# Patient Record
Sex: Female | Born: 1965 | Race: White | Hispanic: No | Marital: Married | Smoking: Current every day smoker
Health system: Southern US, Community
[De-identification: ages and names within clinical notes are randomized; demographics above are authoritative.]

## PROBLEM LIST (undated history)

## (undated) DIAGNOSIS — K469 Unspecified abdominal hernia without obstruction or gangrene: Secondary | ICD-10-CM

## (undated) DIAGNOSIS — F32A Depression, unspecified: Secondary | ICD-10-CM

## (undated) DIAGNOSIS — F329 Major depressive disorder, single episode, unspecified: Secondary | ICD-10-CM

## (undated) DIAGNOSIS — F419 Anxiety disorder, unspecified: Secondary | ICD-10-CM

## (undated) DIAGNOSIS — G8929 Other chronic pain: Secondary | ICD-10-CM

## (undated) DIAGNOSIS — R51 Headache: Secondary | ICD-10-CM

## (undated) DIAGNOSIS — I10 Essential (primary) hypertension: Secondary | ICD-10-CM

## (undated) DIAGNOSIS — C801 Malignant (primary) neoplasm, unspecified: Secondary | ICD-10-CM

## (undated) DIAGNOSIS — K859 Acute pancreatitis without necrosis or infection, unspecified: Secondary | ICD-10-CM

## (undated) DIAGNOSIS — S62109A Fracture of unspecified carpal bone, unspecified wrist, initial encounter for closed fracture: Secondary | ICD-10-CM

## (undated) DIAGNOSIS — J329 Chronic sinusitis, unspecified: Secondary | ICD-10-CM

## (undated) DIAGNOSIS — R519 Headache, unspecified: Secondary | ICD-10-CM

## (undated) DIAGNOSIS — G510 Bell's palsy: Secondary | ICD-10-CM

## (undated) HISTORY — PX: ABDOMINAL HYSTERECTOMY: SHX81

---

## 2004-03-12 ENCOUNTER — Emergency Department (HOSPITAL_COMMUNITY): Admission: EM | Admit: 2004-03-12 | Discharge: 2004-03-12 | Payer: Self-pay | Admitting: Emergency Medicine

## 2004-07-01 ENCOUNTER — Emergency Department (HOSPITAL_COMMUNITY): Admission: EM | Admit: 2004-07-01 | Discharge: 2004-07-02 | Payer: Self-pay | Admitting: Emergency Medicine

## 2004-07-03 ENCOUNTER — Inpatient Hospital Stay (HOSPITAL_COMMUNITY): Admission: EM | Admit: 2004-07-03 | Discharge: 2004-07-08 | Payer: Self-pay | Admitting: Emergency Medicine

## 2004-07-03 ENCOUNTER — Ambulatory Visit: Payer: Self-pay | Admitting: Internal Medicine

## 2004-09-27 ENCOUNTER — Emergency Department: Payer: Self-pay | Admitting: General Practice

## 2004-10-01 ENCOUNTER — Ambulatory Visit: Payer: Self-pay | Admitting: Orthopaedic Surgery

## 2004-10-20 ENCOUNTER — Emergency Department (HOSPITAL_COMMUNITY): Admission: EM | Admit: 2004-10-20 | Discharge: 2004-10-20 | Payer: Self-pay | Admitting: Emergency Medicine

## 2004-10-21 ENCOUNTER — Emergency Department: Payer: Self-pay | Admitting: General Practice

## 2010-02-02 ENCOUNTER — Emergency Department: Payer: Self-pay | Admitting: Emergency Medicine

## 2010-05-03 ENCOUNTER — Emergency Department: Payer: Self-pay | Admitting: Emergency Medicine

## 2010-05-06 ENCOUNTER — Emergency Department: Payer: Self-pay | Admitting: Emergency Medicine

## 2010-05-06 ENCOUNTER — Emergency Department (HOSPITAL_COMMUNITY): Admission: EM | Admit: 2010-05-06 | Discharge: 2010-05-06 | Payer: Self-pay | Admitting: Emergency Medicine

## 2010-05-07 ENCOUNTER — Inpatient Hospital Stay (HOSPITAL_COMMUNITY): Admission: EM | Admit: 2010-05-07 | Discharge: 2010-05-14 | Payer: Self-pay | Admitting: Emergency Medicine

## 2010-05-08 ENCOUNTER — Ambulatory Visit: Payer: Self-pay | Admitting: Oncology

## 2010-06-17 ENCOUNTER — Ambulatory Visit: Payer: Self-pay | Admitting: Obstetrics & Gynecology

## 2010-06-17 ENCOUNTER — Inpatient Hospital Stay (HOSPITAL_COMMUNITY): Admission: EM | Admit: 2010-06-17 | Discharge: 2010-06-24 | Payer: Self-pay | Admitting: Emergency Medicine

## 2010-06-19 ENCOUNTER — Encounter: Payer: Self-pay | Admitting: Interventional Radiology

## 2010-06-24 ENCOUNTER — Encounter: Payer: Self-pay | Admitting: Obstetrics & Gynecology

## 2010-07-25 ENCOUNTER — Inpatient Hospital Stay (HOSPITAL_COMMUNITY)
Admission: AD | Admit: 2010-07-25 | Discharge: 2010-07-29 | Payer: Self-pay | Source: Home / Self Care | Attending: Obstetrics and Gynecology | Admitting: Obstetrics and Gynecology

## 2010-08-02 ENCOUNTER — Encounter: Payer: Self-pay | Admitting: Obstetrics & Gynecology

## 2010-08-02 ENCOUNTER — Ambulatory Visit: Payer: Self-pay | Admitting: Obstetrics and Gynecology

## 2010-08-02 ENCOUNTER — Ambulatory Visit (HOSPITAL_COMMUNITY)
Admission: RE | Admit: 2010-08-02 | Discharge: 2010-08-02 | Payer: Self-pay | Source: Home / Self Care | Attending: Obstetrics & Gynecology | Admitting: Obstetrics & Gynecology

## 2010-08-02 ENCOUNTER — Inpatient Hospital Stay (HOSPITAL_COMMUNITY)
Admission: AD | Admit: 2010-08-02 | Discharge: 2010-08-02 | Payer: Self-pay | Source: Home / Self Care | Attending: Obstetrics & Gynecology | Admitting: Obstetrics & Gynecology

## 2010-08-08 ENCOUNTER — Ambulatory Visit: Payer: Self-pay | Admitting: Family Medicine

## 2010-08-16 ENCOUNTER — Inpatient Hospital Stay (HOSPITAL_COMMUNITY)
Admission: AD | Admit: 2010-08-16 | Discharge: 2010-08-21 | Payer: Self-pay | Source: Home / Self Care | Attending: Obstetrics & Gynecology | Admitting: Obstetrics & Gynecology

## 2010-08-17 ENCOUNTER — Ambulatory Visit (HOSPITAL_COMMUNITY)
Admission: RE | Admit: 2010-08-17 | Discharge: 2010-08-17 | Payer: Self-pay | Source: Home / Self Care | Attending: Obstetrics & Gynecology | Admitting: Obstetrics & Gynecology

## 2010-08-29 ENCOUNTER — Ambulatory Visit
Admission: RE | Admit: 2010-08-29 | Discharge: 2010-08-29 | Payer: Self-pay | Source: Home / Self Care | Attending: Obstetrics and Gynecology | Admitting: Obstetrics and Gynecology

## 2010-09-04 ENCOUNTER — Ambulatory Visit (HOSPITAL_COMMUNITY): Admission: RE | Admit: 2010-09-04 | Payer: Self-pay | Source: Home / Self Care | Admitting: Family Medicine

## 2010-09-09 NOTE — Discharge Summary (Addendum)
[name], [name]                ACCOUNT NO.:  [id number]  MEDICAL RECORD NO.:  [id number]          PATIENT TYPE:  INP  LOCATION:  9306                          FACILITY:  [facility]  PHYSICIAN:  Scheryl Darter, MD       DATE OF BIRTH:  Apr 18, 1966  DATE OF ADMISSION:  08/16/2010 DATE OF DISCHARGE:  08/21/2010                              DISCHARGE SUMMARY   DIAGNOSIS:  Postoperative pelvic abscess.  PROCEDURE:  On August 17, 2010, a CT-guided drainage of pelvic abscess was performed at [facility].  The patient is a 45 year old white female, gravida 2, para 2-0-0-2, who had a supracervical hysterectomy and bilateral salpingo-oophorectomy on July 27, 2010, due to pelvic abscesses.  She presented on August 17, 2011, with sharp abdominal pain, radiating from the suprapubic area all over her abdomen.  She had some nausea and vomiting.  She denied any fever.  PAST MEDICAL HISTORY: 1. Hypertension. 2. Hernia. 3. Hypercholesterolemia. 4. Pelvic inflammatory disease.   PAST SURGICAL HISTORY:  Two cesarean sections, hysterectomy, bilateral salpingo-oophorectomy as noted, tubal ligation.  SOCIAL HISTORY:  The patient is a smoker, but denies alcohol or drug use.  ALLERGIES:  She has allergy to UNASYN.  MEDICATIONS:  Percocet oral as needed and Prilosec oral once a day.  PHYSICAL EXAMINATION:  VITAL SIGNS:  Blood pressure 136/83, pulse 115, respiratory rate 20, temperature 98.3. GENERAL:  The patient appeared uncomfortable CHEST:  Reveals clear lungs. HEART:  Tachycardic. ABDOMEN:  Has diffuse tenderness.  No guarding.  Bowel sounds were hyperactive. PELVIC:  Showed some slight vaginal discharge.  No palpable masses. Suprapubic and cervical tenderness noted.  Her white blood cell count 24.1, hemoglobin, 11.3.  Creatinine 0.23.  CT scan of abdomen and pelvis showed a 6.2 x 7.9 cm irregular rim enhancing fluid collection, compatible with a pelvic abscess.  There  was some secondary reactive wall thickening in the adjacent small bowel.  The patient was admitted for management of a postoperative pelvic abscess.  She was given Rocephin 1 g IV q.12 hours and Cleocin 900 mg IV q.8 hours.  On August 17, 2010, she was transported to [facility] to Interventional Radiology where a CT-guided drain was placed in the pelvic abscess with drainage about 10 mL of thick bloody fluid initially.  Felt relieved after this.  Pain improved.  White blood cell count came down to 8.3.  She was continued on IV antibiotics.  The culture from the fluid showed E. Coli that is resistant to ampicillin but sensitive to Bactrim.  She was started on Bactrim p.o. on August 21, 2010.  She was discharged home with prescription for Bactrim twice a day for 10 days.  She also was given prescription for Vicodin 5/500, 1-2 p.o. every 4-6 hours p.r.n. pain and arrangement were made for Home Health to visit for care of the drain.  She was to follow up at [facility] on August 29, 2010.  She was instructed to notify should she began having fevers or increased pain.     Scheryl Darter, MD     JA/MEDQ  D:  08/21/2010  T:  08/21/2010  Job:  [id number]  Electronically Signed by Scheryl Darter MD on 09/09/2010 11:43:13 AM

## 2010-09-19 ENCOUNTER — Ambulatory Visit: Payer: Self-pay | Admitting: Physician Assistant

## 2010-09-19 ENCOUNTER — Ambulatory Visit: Admit: 2010-09-19 | Payer: Self-pay | Admitting: Obstetrics and Gynecology

## 2010-09-30 ENCOUNTER — Ambulatory Visit: Payer: Self-pay | Admitting: Occupational Therapy

## 2010-10-28 LAB — CBC
HCT: 33 % — ABNORMAL LOW (ref 36.0–46.0)
Hemoglobin: 10.4 g/dL — ABNORMAL LOW (ref 12.0–15.0)
Hemoglobin: 11 g/dL — ABNORMAL LOW (ref 12.0–15.0)
MCH: 30.1 pg (ref 26.0–34.0)
MCHC: 33.1 g/dL (ref 30.0–36.0)
MCHC: 33.3 g/dL (ref 30.0–36.0)
MCV: 90.8 fL (ref 78.0–100.0)
MCV: 91.7 fL (ref 78.0–100.0)
Platelets: 289 10*3/uL (ref 150–400)
Platelets: 340 10*3/uL (ref 150–400)
RDW: 13.2 % (ref 11.5–15.5)
RDW: 13.2 % (ref 11.5–15.5)
WBC: 24.1 10*3/uL — ABNORMAL HIGH (ref 4.0–10.5)

## 2010-10-28 LAB — CULTURE, ROUTINE-ABSCESS

## 2010-10-28 LAB — COMPREHENSIVE METABOLIC PANEL
ALT: 12 U/L (ref 0–35)
Albumin: 3.9 g/dL (ref 3.5–5.2)
Alkaline Phosphatase: 39 U/L (ref 39–117)
GFR calc Af Amer: >60 mL/min (ref >60)
Potassium: 4.6 mEq/L (ref 3.5–5.1)
Sodium: 136 mEq/L (ref 135–145)
Total Protein: 6.1 g/dL (ref 6.0–8.3)

## 2010-10-28 LAB — URINALYSIS, ROUTINE W REFLEX MICROSCOPIC
Hgb urine dipstick: NEGATIVE
Specific Gravity, Urine: >1.03 — ABNORMAL HIGH (ref 1.005–1.030)
Urobilinogen, UA: 0.2 mg/dL (ref 0.0–1.0)
pH: 5.5 (ref 5.0–8.0)

## 2010-10-28 LAB — URINE MICROSCOPIC-ADD ON

## 2010-10-28 LAB — AMYLASE: Amylase: 37 U/L (ref 0–105)

## 2010-10-29 LAB — CBC
HCT: 22.7 % — ABNORMAL LOW (ref 36.0–46.0)
HCT: 50.1 % — ABNORMAL HIGH (ref 36.0–46.0)
HCT: 41.3 % (ref 36.0–46.0)
Hemoglobin: 9.3 g/dL — ABNORMAL LOW (ref 12.0–15.0)
Hemoglobin: 14.1 g/dL (ref 12.0–15.0)
MCH: 33.1 pg (ref 26.0–34.0)
MCH: 33.2 pg (ref 26.0–34.0)
MCH: 33.9 pg (ref 26.0–34.0)
MCH: 33.9 pg (ref 26.0–34.0)
MCHC: 34.1 g/dL (ref 30.0–36.0)
MCHC: 34.1 g/dL (ref 30.0–36.0)
MCHC: 34.7 g/dL (ref 30.0–36.0)
MCHC: 34.9 g/dL (ref 30.0–36.0)
MCV: 96.9 fL (ref 78.0–100.0)
MCV: 98.4 fL (ref 78.0–100.0)
Platelets: 232 10*3/uL (ref 150–400)
Platelets: 244 10*3/uL (ref 150–400)
Platelets: 266 10*3/uL (ref 150–400)
Platelets: 72 10*3/uL — ABNORMAL LOW (ref 150–400)
Platelets: 99 10*3/uL — ABNORMAL LOW (ref 150–400)
RBC: 2.31 MIL/uL — ABNORMAL LOW (ref 3.87–5.11)
RBC: 2.75 MIL/uL — ABNORMAL LOW (ref 3.87–5.11)
RBC: 4.42 MIL/uL (ref 3.87–5.11)
RDW: 13.1 % (ref 11.5–15.5)
RDW: 13.1 % (ref 11.5–15.5)
RDW: 13.5 % (ref 11.5–15.5)
RDW: 12.1 % (ref 11.5–15.5)
RDW: 12.3 % (ref 11.5–15.5)
WBC: 24.6 10*3/uL — ABNORMAL HIGH (ref 4.0–10.5)
WBC: 6.1 10*3/uL (ref 4.0–10.5)
WBC: 12.7 10*3/uL — ABNORMAL HIGH (ref 4.0–10.5)

## 2010-10-29 LAB — GC/CHLAMYDIA PROBE AMP, GENITAL
Chlamydia, DNA Probe: NEGATIVE
GC Probe Amp, Genital: NEGATIVE

## 2010-10-29 LAB — DIFFERENTIAL
Basophils Absolute: 0 10*3/uL (ref 0.0–0.1)
Basophils Absolute: 0 10*3/uL (ref 0.0–0.1)
Basophils Relative: 0 % (ref 0–1)
Eosinophils Absolute: 0.2 10*3/uL (ref 0.0–0.7)
Eosinophils Absolute: 0 10*3/uL (ref 0.0–0.7)
Eosinophils Relative: 0 % (ref 0–5)
Lymphs Abs: 1.5 10*3/uL (ref 0.7–4.0)
Lymphs Abs: 0.7 10*3/uL (ref 0.7–4.0)
Monocytes Absolute: 0 10*3/uL — ABNORMAL LOW (ref 0.1–1.0)
Neutro Abs: 22.9 10*3/uL — ABNORMAL HIGH (ref 1.7–7.7)
Neutrophils Relative %: 94 % — ABNORMAL HIGH (ref 43–77)

## 2010-10-29 LAB — COMPREHENSIVE METABOLIC PANEL
ALT: 18 U/L (ref 0–35)
AST: 18 U/L (ref 0–37)
Alkaline Phosphatase: 60 U/L (ref 39–117)
CO2: 23 mEq/L (ref 19–32)
Chloride: 110 mEq/L (ref 96–112)
GFR calc Af Amer: >60 mL/min (ref >60)
GFR calc non Af Amer: >60 mL/min (ref >60)
Glucose, Bld: 122 mg/dL — ABNORMAL HIGH (ref 70–99)
Sodium: 137 mEq/L (ref 135–145)
Total Bilirubin: 0.6 mg/dL (ref 0.3–1.2)

## 2010-10-29 LAB — WOUND CULTURE

## 2010-10-29 LAB — WET PREP, GENITAL
Clue Cells Wet Prep HPF POC: NONE SEEN
Trich, Wet Prep: NONE SEEN
Yeast Wet Prep HPF POC: NONE SEEN

## 2010-10-29 LAB — BASIC METABOLIC PANEL
CO2: 27 mEq/L (ref 19–32)
Calcium: 8.2 mg/dL — ABNORMAL LOW (ref 8.4–10.5)
Creatinine, Ser: 0.52 mg/dL (ref 0.4–1.2)
Glucose, Bld: 107 mg/dL — ABNORMAL HIGH (ref 70–99)

## 2010-10-29 LAB — URINALYSIS, ROUTINE W REFLEX MICROSCOPIC
Bilirubin Urine: NEGATIVE
Glucose, UA: NEGATIVE mg/dL
Ketones, ur: NEGATIVE mg/dL
Leukocytes, UA: NEGATIVE
Protein, ur: NEGATIVE mg/dL

## 2010-10-29 LAB — GC/CHLAMYDIA PROBE AMP, URINE
Chlamydia, Swab/Urine, PCR: NEGATIVE
GC Probe Amp, Urine: NEGATIVE

## 2010-10-29 LAB — ANAEROBIC CULTURE

## 2010-10-29 LAB — CULTURE, ROUTINE-ABSCESS

## 2010-10-30 LAB — DIFFERENTIAL
Basophils Relative: 0 % (ref 0–1)
Eosinophils Relative: 0 % (ref 0–5)
Lymphocytes Relative: 1 % — ABNORMAL LOW (ref 12–46)
Monocytes Relative: 1 % — ABNORMAL LOW (ref 3–12)
Neutrophils Relative %: 98 % — ABNORMAL HIGH (ref 43–77)

## 2010-10-30 LAB — URINE MICROSCOPIC-ADD ON

## 2010-10-30 LAB — COMPREHENSIVE METABOLIC PANEL
Albumin: 4.6 g/dL (ref 3.5–5.2)
BUN: 13 mg/dL (ref 6–23)
Calcium: 9.2 mg/dL (ref 8.4–10.5)
Creatinine, Ser: 0.7 mg/dL (ref 0.4–1.2)
Potassium: 4.5 mEq/L (ref 3.5–5.1)
Total Protein: 7.3 g/dL (ref 6.0–8.3)

## 2010-10-30 LAB — URINALYSIS, ROUTINE W REFLEX MICROSCOPIC
Leukocytes, UA: NEGATIVE
Protein, ur: 30 mg/dL — AB
Urobilinogen, UA: 0.2 mg/dL (ref 0.0–1.0)

## 2010-10-30 LAB — CBC
MCH: 32.7 pg (ref 26.0–34.0)
MCV: 92.4 fL (ref 78.0–100.0)
Platelets: UNDETERMINED 10*3/uL (ref 150–400)
RDW: 12.1 % (ref 11.5–15.5)
WBC: 19.6 10*3/uL — ABNORMAL HIGH (ref 4.0–10.5)

## 2010-10-30 LAB — CULTURE, BLOOD (ROUTINE X 2)
Culture: NO GROWTH
Culture: NO GROWTH

## 2010-10-30 LAB — POCT I-STAT 3, ART BLOOD GAS (G3+)
pCO2 arterial: 34.4 mmHg — ABNORMAL LOW (ref 35.0–45.0)
pO2, Arterial: 86 mmHg (ref 80.0–100.0)

## 2010-10-30 LAB — LACTIC ACID, PLASMA: Lactic Acid, Venous: 2.7 mmol/L — ABNORMAL HIGH (ref 0.5–2.2)

## 2010-10-30 LAB — TSH: TSH: 0.578 u[IU]/mL (ref 0.350–4.500)

## 2010-10-30 LAB — PROLACTIN: Prolactin: 5.2 ng/mL

## 2010-10-30 LAB — RAPID URINE DRUG SCREEN, HOSP PERFORMED
Benzodiazepines: NOT DETECTED
Cocaine: NOT DETECTED

## 2010-10-30 LAB — POCT PREGNANCY, URINE: Preg Test, Ur: NEGATIVE

## 2010-10-30 LAB — HIV ANTIBODY (ROUTINE TESTING W REFLEX): HIV: NONREACTIVE

## 2010-10-30 LAB — CORTISOL: Cortisol, Plasma: 33.8 ug/dL

## 2010-10-31 LAB — BASIC METABOLIC PANEL
BUN: 14 mg/dL (ref 6–23)
BUN: 2 mg/dL — ABNORMAL LOW (ref 6–23)
BUN: 3 mg/dL — ABNORMAL LOW (ref 6–23)
BUN: 4 mg/dL — ABNORMAL LOW (ref 6–23)
BUN: 5 mg/dL — ABNORMAL LOW (ref 6–23)
CO2: 27 mEq/L (ref 19–32)
CO2: 29 mEq/L (ref 19–32)
Calcium: 8.8 mg/dL (ref 8.4–10.5)
Calcium: 8.8 mg/dL (ref 8.4–10.5)
Calcium: 9.4 mg/dL (ref 8.4–10.5)
Chloride: 102 mEq/L (ref 96–112)
Chloride: 104 mEq/L (ref 96–112)
Chloride: 106 mEq/L (ref 96–112)
Creatinine, Ser: 0.57 mg/dL (ref 0.4–1.2)
Creatinine, Ser: 0.66 mg/dL (ref 0.4–1.2)
Creatinine, Ser: 0.68 mg/dL (ref 0.4–1.2)
Creatinine, Ser: 0.68 mg/dL (ref 0.4–1.2)
GFR calc Af Amer: >60 mL/min (ref >60)
GFR calc Af Amer: >60 mL/min (ref >60)
GFR calc non Af Amer: >60 mL/min (ref >60)
GFR calc non Af Amer: >60 mL/min (ref >60)
GFR calc non Af Amer: >60 mL/min (ref >60)
GFR calc non Af Amer: >60 mL/min (ref >60)
GFR calc non Af Amer: >60 mL/min (ref >60)
Glucose, Bld: 116 mg/dL — ABNORMAL HIGH (ref 70–99)
Glucose, Bld: 119 mg/dL — ABNORMAL HIGH (ref 70–99)
Glucose, Bld: 136 mg/dL — ABNORMAL HIGH (ref 70–99)
Glucose, Bld: 140 mg/dL — ABNORMAL HIGH (ref 70–99)
Potassium: 3.6 mEq/L (ref 3.5–5.1)
Potassium: 3.8 mEq/L (ref 3.5–5.1)
Potassium: 3.9 mEq/L (ref 3.5–5.1)
Sodium: 137 mEq/L (ref 135–145)
Sodium: 138 mEq/L (ref 135–145)
Sodium: 141 mEq/L (ref 135–145)

## 2010-10-31 LAB — CBC
HCT: 38.8 % (ref 36.0–46.0)
HCT: 41.7 % (ref 36.0–46.0)
HCT: 42.7 % (ref 36.0–46.0)
HCT: 43.1 % (ref 36.0–46.0)
HCT: 47.5 % — ABNORMAL HIGH (ref 36.0–46.0)
Hemoglobin: 13.1 g/dL (ref 12.0–15.0)
Hemoglobin: 14.4 g/dL (ref 12.0–15.0)
Hemoglobin: 14.5 g/dL (ref 12.0–15.0)
Hemoglobin: 16.1 g/dL — ABNORMAL HIGH (ref 12.0–15.0)
Hemoglobin: 16.1 g/dL — ABNORMAL HIGH (ref 12.0–15.0)
MCH: 33.5 pg (ref 26.0–34.0)
MCH: 33.8 pg (ref 26.0–34.0)
MCHC: 33.8 g/dL (ref 30.0–36.0)
MCHC: 33.8 g/dL (ref 30.0–36.0)
MCHC: 34 g/dL (ref 30.0–36.0)
MCHC: 34.1 g/dL (ref 30.0–36.0)
MCHC: 34.1 g/dL (ref 30.0–36.0)
MCHC: 34.4 g/dL (ref 30.0–36.0)
MCV: 98.2 fL (ref 78.0–100.0)
MCV: 98.5 fL (ref 78.0–100.0)
MCV: 98.6 fL (ref 78.0–100.0)
MCV: 98.8 fL (ref 78.0–100.0)
MCV: 98.9 fL (ref 78.0–100.0)
Platelets: 185 10*3/uL (ref 150–400)
Platelets: 211 10*3/uL (ref 150–400)
Platelets: 240 10*3/uL (ref 150–400)
RBC: 4.25 MIL/uL (ref 3.87–5.11)
RBC: 4.8 MIL/uL (ref 3.87–5.11)
RDW: 12.4 % (ref 11.5–15.5)
RDW: 12.6 % (ref 11.5–15.5)
RDW: 12.6 % (ref 11.5–15.5)
RDW: 12.8 % (ref 11.5–15.5)
RDW: 12.8 % (ref 11.5–15.5)
WBC: 10.9 10*3/uL — ABNORMAL HIGH (ref 4.0–10.5)
WBC: 11.1 10*3/uL — ABNORMAL HIGH (ref 4.0–10.5)
WBC: 11.8 10*3/uL — ABNORMAL HIGH (ref 4.0–10.5)

## 2010-10-31 LAB — COMPREHENSIVE METABOLIC PANEL
ALT: 10 U/L (ref 0–35)
Alkaline Phosphatase: 49 U/L (ref 39–117)
BUN: 5 mg/dL — ABNORMAL LOW (ref 6–23)
CO2: 30 mEq/L (ref 19–32)
GFR calc non Af Amer: >60 mL/min (ref >60)
Glucose, Bld: 136 mg/dL — ABNORMAL HIGH (ref 70–99)
Potassium: 3.7 mEq/L (ref 3.5–5.1)
Sodium: 138 mEq/L (ref 135–145)
Total Bilirubin: 0.5 mg/dL (ref 0.3–1.2)
Total Protein: 5.7 g/dL — ABNORMAL LOW (ref 6.0–8.3)

## 2010-10-31 LAB — URINE MICROSCOPIC-ADD ON

## 2010-10-31 LAB — DIFFERENTIAL
Basophils Absolute: 0 10*3/uL (ref 0.0–0.1)
Basophils Absolute: 0 10*3/uL (ref 0.0–0.1)
Basophils Absolute: 0 10*3/uL (ref 0.0–0.1)
Basophils Absolute: 0 10*3/uL (ref 0.0–0.1)
Basophils Absolute: 0.1 10*3/uL (ref 0.0–0.1)
Basophils Relative: 0 % (ref 0–1)
Basophils Relative: 0 % (ref 0–1)
Basophils Relative: 0 % (ref 0–1)
Basophils Relative: 0 % (ref 0–1)
Basophils Relative: 0 % (ref 0–1)
Basophils Relative: 0 % (ref 0–1)
Basophils Relative: 1 % (ref 0–1)
Eosinophils Absolute: 0.1 10*3/uL (ref 0.0–0.7)
Eosinophils Absolute: 0.5 10*3/uL (ref 0.0–0.7)
Eosinophils Absolute: 0.7 10*3/uL (ref 0.0–0.7)
Eosinophils Absolute: 0.9 10*3/uL — ABNORMAL HIGH (ref 0.0–0.7)
Eosinophils Absolute: 0.9 10*3/uL — ABNORMAL HIGH (ref 0.0–0.7)
Eosinophils Relative: 6 % — ABNORMAL HIGH (ref 0–5)
Eosinophils Relative: 7 % — ABNORMAL HIGH (ref 0–5)
Eosinophils Relative: 7 % — ABNORMAL HIGH (ref 0–5)
Lymphocytes Relative: 18 % (ref 12–46)
Lymphs Abs: 0.5 10*3/uL — ABNORMAL LOW (ref 0.7–4.0)
Lymphs Abs: 1.1 10*3/uL (ref 0.7–4.0)
Lymphs Abs: 1.9 10*3/uL (ref 0.7–4.0)
Monocytes Absolute: 0.3 10*3/uL (ref 0.1–1.0)
Monocytes Absolute: 0.4 10*3/uL (ref 0.1–1.0)
Monocytes Absolute: 0.4 10*3/uL (ref 0.1–1.0)
Monocytes Absolute: 0.7 10*3/uL (ref 0.1–1.0)
Monocytes Absolute: 0.9 10*3/uL (ref 0.1–1.0)
Monocytes Relative: 2 % — ABNORMAL LOW (ref 3–12)
Monocytes Relative: 2 % — ABNORMAL LOW (ref 3–12)
Monocytes Relative: 3 % (ref 3–12)
Monocytes Relative: 6 % (ref 3–12)
Monocytes Relative: 8 % (ref 3–12)
Neutro Abs: 15.8 10*3/uL — ABNORMAL HIGH (ref 1.7–7.7)
Neutro Abs: 7.4 10*3/uL (ref 1.7–7.7)
Neutro Abs: 8.9 10*3/uL — ABNORMAL HIGH (ref 1.7–7.7)
Neutro Abs: 9.6 10*3/uL — ABNORMAL HIGH (ref 1.7–7.7)
Neutrophils Relative %: 68 % (ref 43–77)
Neutrophils Relative %: 86 % — ABNORMAL HIGH (ref 43–77)
Neutrophils Relative %: 86 % — ABNORMAL HIGH (ref 43–77)

## 2010-10-31 LAB — URINALYSIS, ROUTINE W REFLEX MICROSCOPIC
Glucose, UA: NEGATIVE mg/dL
Ketones, ur: 40 mg/dL — AB
Protein, ur: NEGATIVE mg/dL
Urobilinogen, UA: 1 mg/dL (ref 0.0–1.0)

## 2010-10-31 LAB — HEPATIC FUNCTION PANEL
ALT: 12 U/L (ref 0–35)
Bilirubin, Direct: 0.2 mg/dL (ref 0.0–0.3)
Indirect Bilirubin: 0.5 mg/dL (ref 0.3–0.9)
Total Bilirubin: 0.7 mg/dL (ref 0.3–1.2)

## 2010-10-31 LAB — WET PREP, GENITAL
Trich, Wet Prep: NONE SEEN
Yeast Wet Prep HPF POC: NONE SEEN

## 2010-10-31 LAB — LACTIC ACID, PLASMA: Lactic Acid, Venous: 1.8 mmol/L (ref 0.5–2.2)

## 2010-10-31 LAB — LIPASE, BLOOD: Lipase: 18 U/L (ref 11–59)

## 2010-10-31 LAB — PROCALCITONIN: Procalcitonin: 0.09 ng/mL

## 2010-10-31 LAB — POCT PREGNANCY, URINE: Preg Test, Ur: NEGATIVE

## 2011-01-03 NOTE — Discharge Summary (Signed)
NAMECLEVELAND, [name]                ACCOUNT NO.:  [id number]   MEDICAL RECORD NO.:  [id number]          PATIENT TYPE:  INP   LOCATION:  A310                          FACILITY:  [facility]   PHYSICIAN:  Vania Rea, M.D. DATE OF BIRTH:  Aug 20, 1965   DATE OF ADMISSION:  07/03/2004  DATE OF DISCHARGE:  11/21/2005LH                                 DISCHARGE SUMMARY   PRIMARY CARE PHYSICIAN:  Unassigned.  Assigned to Dr. Delbert Harness.   DISCHARGE DIAGNOSIS:  1.  Multiple peptic ulcers, probable NSAID use.  2.  Goody's powder abuse.  3.  Persistent abdominal pain.  4.  Urinary tract infection.  5.  Hypokalemia, resolved.   DISPOSITION:  Discharged to home.   DISCHARGE CONDITION:  Stable.   DISCHARGE MEDICATIONS:  1.  Prilosec 20 mg twice daily.  2.  Vicodin 5/500 1-2 tablets every four hours p.r.n., 30 tablets were      prescribed.  3.  Bactrim double-strength 1 tablet twice daily for one week.   HOSPITAL COURSE:  Please refer to admission history and physical from  November 17.  [name] who was admitted to the  hospitalist service on November 17 with epigastric pain, nausea, and  vomiting.  The patient had a CT scan which showed thickening of gastric  mucosa compatible with peptic ulcer disease.  The patient was treated  symptomatically and a GI consult was sought.  The patient had endoscopy on  November 17 which revealed four antral ulcers with antral gastritis.  Pyloric channel with patent.  She also had superficial linear erosions,  probably secondary to GERD due to the peptic ulcer disease.   The patient had H. Pylori serology which was negative.  The patient had a  urinalysis done on admission which was suggestive of infection.  Urine was  taken for culture and she was started empirically on IV Cipro.  However, the  patient had a local reaction to Cipro and was switched to oral Levaquin.  Urine culture results became available today, and she is  growing E. Coli  resistant to quinolones but sensitive to Bactrim.  The patient is being  discharged home with Bactrim.   The patient is tolerating a soft diet.  Discharge was planned for Saturday,  two days ago, but because of persistent pain, it seems the patient's  discharge is held.  Today, the patient continues to require IV Dilaudid but  is being sent hom with oral Vicodin.   PHYSICAL EXAMINATION:  A pleasant young Caucasian lady sitting up in bed in  no distress at this time.  Her temperature is 97.4, pulse 78, respirations  20, blood pressure is 120/79.  She is saturating at 97% on room air.  Her  chest is clear to auscultation bilaterally.  Cardiovascular system:  Regular  rhythm.  Abdomen:  She has mild epigastric tenderness, normal abdominal  bowel sounds, no organomegaly and no masses.  Extremities are without edema.   LABORATORY DATA:  Her hemoglobin is stable at 13.3, hematocrit 38.5.  Her H.  Pylori antibody was 0.8.  FOLLOWUP:  The patient had no medical physician, and she was admitted the  day that Dr. Josefine Class was on call.  She had been assigned to Dr.  Dechurch/Dr. Delbert Harness to follow up in one to two weeks;  also to be  followed up by GI service who will call her to make an appointment.     Leop   LC/MEDQ  D:  07/08/2004  T:  07/08/2004  Job:  [id number]

## 2011-01-03 NOTE — Group Therapy Note (Signed)
[name], [name]                ACCOUNT NO.:  [id number]   MEDICAL RECORD NO.:  [id number]          PATIENT TYPE:  INP   LOCATION:  A310                          FACILITY:  [facility]   PHYSICIAN:  Vania Rea, M.D. DATE OF BIRTH:  1965/12/08   DATE OF PROCEDURE:  07/05/2004  DATE OF DISCHARGE:                                   PROGRESS NOTE   HOSPITAL DAY NUMBER THREE   SUBJECTIVE:  The patient says she is having minimal pain. She is receiving  scheduled Dilaudid.  She understands the nature of her ulcers and the relation to Goodie's  Powders. She feels more comfortable now that she clearly understands that  she has no ovarian cysts and she has apologized for her behavior yesterday.  She is currently tolerating a full liquid diet and is being moved up to a  soft diet.   PHYSICAL EXAMINATION:  VITAL SIGNS:  Temperature is 98.1, pulse 84,  respiration 20, blood pressure 130/83, she is saturating at 99% on room air.  CHEST:  Clear to auscultation bilaterally.  CARDIOVASCULAR:  Regular rhythm.  ABDOMEN:  She has epigastric tenderness.  EXTREMITIES:  Are without edema.   Labs were not drawn today. H. pylori negative.   ASSESSMENT:  1.  Multiple antral ulcers secondary to NSAID use.  2.  Urinary tract infection; cultures pending.  3.  Local reaction to intravenous Cipro. Will start her on oral Levaquin      while she is in the hospital to observe response.  4.  Anxiety disorder, improved.   PLAN:  This lady, as per the GI service if she is able to tolerate a soft  diet, should be able to be discharged home. Since she has no physician she  will be assigned to the physician on call on the day of her admission: Dr.  Josefine Class.   Plan to discharged home on Prilosec 20 mg b.i.d. and Levaquin to complete a  5-day course.  Her urine cultures will be followed up by primary care  physician and she will be followed up as an outpatient by Dr. Karilyn Cota.     Leop   LC/MEDQ  D:   07/05/2004  T:  07/05/2004  Job:  [id number]

## 2011-01-03 NOTE — Group Therapy Note (Signed)
Mary, Case                ACCOUNT NO.:  [id number]   MEDICAL RECORD NO.:  [id number]          PATIENT TYPE:  INP   LOCATION:  A310                          FACILITY:  [facility]   PHYSICIAN:  Margaretmary Dys, M.D.DATE OF BIRTH:  [birth year]   DATE OF PROCEDURE:  07/06/2004  DATE OF DISCHARGE:                                   PROGRESS NOTE   SUBJECTIVE:  Hospital day #4.  The patient states she is still having some  pain rated about 6/10.  She continues to receive her Dilaudid.  The patient  says the pain is still mostly in the epigastric region.  She denies any  nausea, vomiting or diarrhea.  She continues to tolerate soft diet.   OBJECTIVE:  Physical examination shows she was conscious, lying comfortably,  not in acute distress.  Blood pressure 141/80, pulse 86, respirations 20,  temperature 98.8 degrees Fahrenheit.  Oxygen saturations 99% on room air.  HEENT exam, normocephalic, atraumatic.  Oral mucosa moist with no exudate.  Neck supple, no JVD, no lymphadenopathy.  Lungs clear clinically with good  air entry bilateral.  Heart S1, S2 regular.  No S3, S4, murmurs, gallops or  rubs. Abdomen is soft, nondistended.  The patient had mild epigastric  tenderness.  Extremities, no pitting pedal edema.   Laboratory studies and diagnostic studies done.  It is noted that H pylori  was negative.  WBC was 6.9, hemoglobin 12.3, hematocrit 36.6, platelet count  234,000.   ASSESSMENT/PLAN:  [name] is a 45 year old Caucasian female admitted  with acute abdominal pain, found to have multiple enteral ulcers related to  nonsteroidal antiinflammatory drugs.  The patient also has urinary tract  infection with the cultures currently pending.  We will continue on Levaquin  for now.  We will also continue on Prilosec b.i.d.  The patient's  requirement for pain medication is slightly high, and she still rates as she  still rates her pain on a high scale.  We will observe the patient for the  rest of the day. If comfortable, we will discharge if the pain gets better  with less use of narcotic analgesic.  We will discharge in the morning.     [name]   AM/MEDQ  D:  07/06/2004  T:  07/06/2004  Job:  [id number]

## 2011-01-03 NOTE — Group Therapy Note (Signed)
[name], [name]                ACCOUNT NO.:  [id number]   MEDICAL RECORD NO.:  [id number]          PATIENT TYPE:  INP   LOCATION:  A310                          FACILITY:  [facility]   PHYSICIAN:  Vania Rea, M.D. DATE OF BIRTH:  1966-04-28   DATE OF PROCEDURE:  07/04/2004  DATE OF DISCHARGE:                                   PROGRESS NOTE   SUBJECTIVE:  The patient says she is having minimal epigastric tenderness  but her pain has been relieved by morphine.  Her nausea has been relieved by  Phenergan.  She remains NPO pending evaluation for endoscopy.  She is  somewhat concerned because she feels she was told that she had an ovarian  cyst.  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  The patient has been reassured and was given a copy of her CT  scan report which shows no evidence of ovarian cyst.  It does show adrenal  enlargement which may be cystic but needs further evaluation later.  She has  been reassured but there is no cause for concern, however, she appears to be  very distressed, is requesting a repeat CT scan.  We have done our best to  try to reassure the patient on this regard.   PHYSICAL EXAMINATION:  VITAL SIGNS:  Temperature 98.2, pulse 78,  respirations 16, blood pressure 122/78.  She is saturating at 97% on room  air.  HEENT:  Mucous membranes are pink and anicteric.  She is not dehydrated.  CHEST:  Clear to auscultation bilaterally.  CARDIOVASCULAR:  Regular rhythm without murmur.  ABDOMEN:  She had mild epigastric tenderness.  It is otherwise unremarkable.  She has no pelvic tenderness.  EXTREMITIES:  Without edema.   LABORATORY DATA:  Her white count has fallen to 10.9, hemoglobin 12.9,  hematocrit 38, platelets 257, she has a normal differential on her white  count.  Her serum chemistry is pretty normal with a sodium of 141, potassium  3.7, chloride 105, CO2 30,  glucose 100, BUN 17, creatinine 0.8.  Her liver  function tests were previously noted to be normal.  Her urinalysis is  positive for nitrites with a trace of leukocyte esterase and many bacteria.   ASSESSMENT:  1.  Nausea and vomiting resolved on medications, possible peptic ulcer      disease.  2.  Urinary tract infection without evidence of pyelonephritis.   PLAN:  1.  Keep NPO for planned EGD.  2.  Send urine for culture and sensitivity and start Cipro.  For her anxiety we have done our best to reassure her.  Hopefully we can get  her EGD and discharge her home as soon as possible.     Leop  LC/MEDQ  D:  07/04/2004  T:  07/04/2004  Job:  [id number]

## 2011-01-03 NOTE — Op Note (Signed)
NAMECAMILIA, [name]                ACCOUNT NO.:  [id number]   MEDICAL RECORD NO.:  [id number]          PATIENT TYPE:  INP   LOCATION:  A310                          FACILITY:  [facility]   PHYSICIAN:  Lionel December, M.D.    DATE OF BIRTH:  [birth year]   DATE OF PROCEDURE:  07/04/2004  DATE OF DISCHARGE:                                 OPERATIVE REPORT   PROCEDURE:  Esophagogastroduodenoscopy.   INDICATIONS:  [name] is a 45 year old Caucasian female with epigastric pain,  nausea, vomiting, who is suspected to have peptic ulcer disease based on CT  findings.  The procedure risks were reviewed with the patient, and informed  consent was obtained.   PREMEDICATION:  Cetacaine spray for pharyngeal topical anesthesia, Demerol  25 mg IV, Versed 7 mg IV in divided dose.   FINDINGS:  Procedure performed in endoscopy suite.  The patient's vital  signs and O2 saturation were monitored during procedure and remained stable.  The patient was placed in the left lateral recumbent position and the  Olympus video scope was passed via oropharynx without any difficulty into  esophagus.  The esophagus mucosa of the proximal and middle third was  normal.  Distally there was a row of superficial erosions.  No ring or  stricture was noted.   Stomach:  It was empty other than some fluid.  It distended very well with  insufflation.  Folds of the proximal stomach were normal.  Examination of  the mucosa revealed fairly demarcated mucosal edema, erythema, scarring, and  four ulcers in the antral prepyloric area.  Three of the ulcers were  superficial.  One was deep, about 6 x 8 mm.  No stigmata of bleeding were  noted.  The pyloric channel was patent.  Angularis, fundus, and cardia were  examined by retroflexing the scope and were normal.   Duodenum:  Examination of the bulb revealed normal mucosa.  The scope was  passed to the second part of the duodenum, where mucosa and folds are  normal.   The endoscope was  pulled back to the stomach.  Biopsy was taken from the  margin of the largest ulcer along with abnormal mucosa intervening between  these ulcers.  Biopsy from the mucosa between these ulcers.  The endoscope  was withdrawn.  The patient tolerated the procedure well.   FINAL DIAGNOSES:  1.  Antral gastritis with multiple (four) ulcers.  The pyloric channel is      patent.  2.  Superficial linear erosions felt to be secondary to acute      gastroesophageal reflux disease due to peptic ulcer disease.   RECOMMENDATIONS:  1.  Will continue Protonix 40 mg IV q.12h.  2.  Full liquid diet.  3.  H. pylori serology.  4.  Please note that the patient complained of burning in the forearm at IV      site.  She was given Benadryl, and stopping the Cipro alleviated her      pain and burning.  I talked with Dr. Orvan Falconer over the phone, and it was  decided to hold her Cipro for now.  Looking at the bag, it appears that      she has received 200 mg IV.     Naje   NR/MEDQ  D:  07/04/2004  T:  07/05/2004  Job:  [id number]   cc:   Vania Rea, M.D.

## 2011-01-03 NOTE — H&P (Signed)
[name], [name]                ACCOUNT NO.:  [id number]   MEDICAL RECORD NO.:  [id number]          PATIENT TYPE:  EMS   LOCATION:  ED                            FACILITY:  [facility]   PHYSICIAN:  [name], M.D.DATE OF BIRTH:  [birth year]   DATE OF ADMISSION:  07/03/2004  DATE OF DISCHARGE:  LH                                HISTORY & PHYSICAL   ADMITTING DIAGNOSES:  1.  Acute abdominal pain.  2.  Peptic ulcer disease.  3.  Dehydration.  4.  Nausea and vomiting.  5.  Hypokalemia.   CHIEF COMPLAINT:  Abdominal pain with nausea and vomiting over the last  three days.   HISTORY OF PRESENT ILLNESS:  [name] is an 45 year old Caucasian female  who presented to the emergency room today complaining of abdominal pain  mostly in the epigastric area.  She says she has been having the pain over  several weeks and months now.  She was told in the past that she had peptic  ulcer disease and is supposed to be on Prevacid which the patient has not  been taking due to lack of medical insurance.  She was actually in the  emergency room two days ago, with the onset of the pain.  She was seen and  did receive some medications, and was treated for acute gastritis.  The  patient, however, returns today with worsening pain.  She rates it an 8/10  located in the epigastric area with no radiation.  She did not given any  exacerbating or aggravating factors.  She says she has had some chills but  no fever.  She has also had nausea and vomiting, and a very poor appetite  for the last three days.  She feels her mouth is very dry.   She has never had an endoscopy done, and only has been empirically treated  with Prevacid and some Zantac which the patient is not very compliant with.   She denies any headache, dizziness or lightheadedness.  She has no  frequency, urgency or dysuria.  She says she has lost some weight over the  last three days due to a poor appetite and lack of eating.   REVIEW  OF SYSTEMS:  A 10-point review of systems was done, and was otherwise  negative except as mentioned in the history of present illness above.   PAST MEDICAL HISTORY:  1.  Depression.  2.  Hypertension.  3.  Peptic ulcer disease.   MEDICATIONS:  1.  Prevacid 30 mg p.o. daily.  2.  Norvasc 10 mg p.o. daily.  3.  Wellbutrin 150 mg p.o. daily.  Although the patient is poorly compliant to these medications.   ALLERGIES:  She has no known drug allergies.   FAMILY HISTORY:  The patient has both parents still alive.  Father has  lymphoma.  Mother has hypertension.  She has two brothers and one sister.  She is not in touch with them and does not known their health status.   SOCIAL HISTORY:  She is married.  She has two children, a 75 year old and  a  17 year old.  She smokes 1-3 packs per day, but has not smoked in three days  since her illness.  She denies any significant alcohol use.  She does smoke  marijuana.  She denies any IV drug abuse or cocaine use.   PHYSICAL EXAMINATION:  GENERAL:  Alert, in mild distress, well-oriented in  time, place and person.  VITAL SIGNS:  Blood pressure was 137/76, pulse 84, respiratory rate 20,  temperature 99.4 degrees Fahrenheit.  Her pain was 8/10 when I interviewed  her.  HEENT:  Normocephalic, atraumatic.  Oral mucosa was dry with no exudates  noted.  No thrush was seen.  NECK:  Supple, no JVD, no lymphadenopathy.  LUNGS:  Clear clinically with good air entry bilaterally.  HEART:  S1, S2 was regular, no S3, S4, gallops or rubs.  ABDOMEN:  Soft, not distended.  The patient had some mild epigastric  tenderness with no rebound tenderness.  There was no guarding. There was no  rigidity.  Bowel sounds were positive.  No mass was palpable.  No  hepatosplenomegaly.  EXTREMITIES:  No pitting pedal edema, no calf tenderness was noted.  NEUROLOGIC:  CNS exam was grossly intact with no focal deficits.   LABORATORY DATA/ DIAGNOSTIC STUDIES:  Urinalysis was  negative.  White blood  cell count 14.2, hemoglobin 15.4, hematocrit 44.9, platelet count 312.  There was no left shift.  Sodium 134, potassium 3.9, chloride 98, carbon  dioxide 25.  Glucose 127.  BUN is 24, creatinine 0.8, calcium is 9.2.   Total protein 7, albumin 4.5, AST 13, ALT 11, alkaline phosphatase 44, total  bilirubin 1.2, direct bilirubin 0.2, and indirect bilirubin 1.0.   A CT scan of the abdomen and pelvis was noted to show some intra-wall  thickening of the stomach, most likely a large ulcer.  The patient seemed to  also what appeared to be complex right adrenal lesion which needs followup  with MRI.   ASSESSMENT/PLAN:  61.  A 45 year old with acute abdominal pain, symptoms and signs.  A CT exam      is consistent with acute peptic ulcer exacerbation.  The patient will be      admitted to the medical floor, will receive hydration due to her      dehydrated status.  We will also start her on Protonix 40 mg IV q.12h.      for now.  I will request GI to see her in the morning for an EGD.  I      will keep him n.p.o. after midnight for that.  2.  Dehydration.  The patient will be hydrated with IV fluids, normal      saline, and her potassium also replaced.  3.  Nausea and vomiting.  We will put her on Phenergan as needed p.r.n. Her      pain will be controlled with Dilaudid 1-2 mg IV q.4h. p.r.n.  4.  For adrenal lesion, the cause of this remains unclear.  I think this can      be pursued once we are able to take care of acute issues at this time.      This may represent much bigger problem or possibly metastases, or just a      benign cyst.  5.  The patient has a history of smoking.  A CT scan of her chest may be      worthwhile prior to discharge, or as an outpatient.  6.  Deep vein thrombosis prophylaxis with  SCD's.  7.  I have discussed the above plan with her and her husband, and they both      verbalized understanding.   CODE STATUS:  Full code.     Ayor    AM/MEDQ  D:  07/03/2004  T:  07/03/2004  Job:  [id number]

## 2011-01-03 NOTE — Consult Note (Signed)
NAMELESHAWN, HOUSEWORTH                ACCOUNT NO.:  [id number]   MEDICAL RECORD NO.:  [id number]          PATIENT TYPE:  INP   LOCATION:  A310                          FACILITY:  [facility]   PHYSICIAN:  Lionel December, M.D.    DATE OF BIRTH:  1966/01/08   DATE OF CONSULTATION:  07/04/2004  DATE OF DISCHARGE:                                   CONSULTATION   REASON FOR CONSULTATION:  Epigastric pain, nausea and vomiting, gastric  ulcer suspected based on CT results.   HISTORY OF PRESENT ILLNESS:  Lempi is a 45 year old Caucasian female who was  admitted to Dr. Blair Dolphin service yesterday via the emergency room where  she presented for the second time in a couple of days with worsening  epigastric pain, nausea, and vomiting of four days duration.  She states she  had had this pain off and on for the last few weeks, but the symptoms had  gotten worse four days ago.  She has had multiple episodes of nausea,  vomiting, and on one occasion, vomited a scant amount of blood.  She has  lost a few pounds.  She has not experienced melena or rectal bleeding.  The  pain is described to be quite sharp and located in the mid epigastric area.  She also has been experiencing heartburn since she has been vomiting.  She  has not had any fever, chills or dysuria.  She has a history of peptic ulcer  disease.  She recalls that she had an EGD in [place], [place] in  1997.  She is supposed to be on Prevacid, but she has not been able to  afford it, and has been taking over-the-counter Zantac on a p.r.n. basis.  She does not take any NSAIDS.  She is not aware of whether or not she has  been tested for H pylori infection.   She had an abdominal CT yesterday which revealed marked thickening to antral  region of the stomach.  This is anterior as well as posterior.  She had two  areas of increased echogenicity in the liver felt to be fatty change near  the falciform ligament.  She also had what was felt to be a  complex right  adrenal mass.  Her ovaries and uterus were normal.   On admission, she had hypokalemia with serum potassium of 2.8 which has been  corrected to normal.  She is now feeling hungry.  She states her pain has  decreased in intensity.   MEDICATIONS AT HOME:  1.  Norvasc 10 mg daily.  2.  Wellbutrin 150 mg daily.  3.  Prevacid and Zantac p.r.n.   She is presently on Protonix 40 mg IV q.12h., Cipro 400 mg IV q.12h.,  Tylenol 650 mg p.o. q.4h., Dilaudid 1-2 mg q.2 p.r.n.   PAST MEDICAL HISTORY:  Medical problems include depression and hypertension,  history of peptic ulcer disease as reviewed above.  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  She had C-section x2, 17 and 15 years  ago.   ALLERGIES:  No known allergies.   FAMILY HISTORY:  Father has been treated for lymphoma and remains in  remission.  Mother has hypertension.  She has three siblings.  They are all  in good health.   SOCIAL HISTORY:  She is married.  She has two healthy children.  She is  smoking about two packs per day.  She has smoked as many as three and is now  trying to quit.  She does not drink alcohol. She is presently working as a  Biomedical engineer.   PHYSICAL EXAMINATION:  GENERAL:  A pleasant, well-developed, thin Caucasian  female who appears to be in no acute distress.  VITAL SIGNS:  Admission weight 120.7 pounds.  She is 67 inches tall.  Pulse  75 per minute.  Blood pressure 120/75, respirations 20, and temperature  99.1.  Conjunctivae is pink.  Sclerae is nonicteric.  Oropharyngeal mucosa  is dry, otherwise normal.  NECK:  Without masses or thyromegaly.  CARDIAC:  Regular rhythm, normal S1 and S2.  No murmur or gallop noted.  LUNGS:  Clear to auscultation.  ABDOMEN:  Flat.  Bowel sounds are normal.  Soft with moderate mid epigastric  tenderness, some guarding but no rebound.  No organomegaly.  No masses  noted.  RECTAL:  Exam is  deferred.  No clubbing or peripheral edema is noted.   LABORATORY DATA:  Admission WBC 11.7, H&H 14.5 and 41.0, platelets count  300,000.  Sodium 134, potassium 2.9 (please note that 2.8 was from three  days ago).  Chloride 98.  CO2 25, glucose 127, BUN 24, creatinine 0.8.  LFTs  from three days ago are normal.  Labs from this morning, WBC 10.9, H&H is  12.9 and 38.3, platelet count 257,000.  Serum potassium 3.7, BUN 17,  creatinine 0.8, calcium is 8.6.  I have reviewed her abdominal CT with Dr.  __________ findings as above.   ASSESSMENT:  1.  Lattie is a 45 year old Caucasian female with a history of peptic ulcer      disease who presents with progressive epigastric pain associated with      nausea, vomiting and a scant amount of hematemesis.  Her H&H are normal.      CT reveals markedly-thickened antral wall both anteroposteriorly.  This      change is quite extensive but still could be due to peptic ulcer      disease.  She is symptomatically better with IV PPI therapy.  2.  Helicobacter pylori status is unknown.   RECOMMENDATIONS:  We will proceed with diagnostic  esophagogastroduodenoscopy. Further recommendations depend on endoscopic  findings.  I have reviewed the procedure and risks with the patient, and she  is agreeable.   We would like to thank Dr. Orvan Falconer for the opportunity to participate in  the care of this nice lady.     Naje   NR/MEDQ  D:  07/04/2004  T:  07/04/2004  Job:  [id number]

## 2012-03-13 ENCOUNTER — Emergency Department (HOSPITAL_COMMUNITY): Payer: Self-pay

## 2012-03-13 ENCOUNTER — Emergency Department (HOSPITAL_COMMUNITY)
Admission: EM | Admit: 2012-03-13 | Discharge: 2012-03-13 | Disposition: A | Discharge status: Home / Self Care | Payer: Self-pay | Attending: Emergency Medicine | Admitting: Emergency Medicine

## 2012-03-13 ENCOUNTER — Encounter (HOSPITAL_COMMUNITY): Payer: Self-pay

## 2012-03-13 DIAGNOSIS — H53149 Visual discomfort, unspecified: Secondary | ICD-10-CM | POA: Insufficient documentation

## 2012-03-13 DIAGNOSIS — R059 Cough, unspecified: Secondary | ICD-10-CM | POA: Insufficient documentation

## 2012-03-13 DIAGNOSIS — R05 Cough: Secondary | ICD-10-CM | POA: Insufficient documentation

## 2012-03-13 DIAGNOSIS — R51 Headache: Secondary | ICD-10-CM | POA: Insufficient documentation

## 2012-03-13 DIAGNOSIS — I10 Essential (primary) hypertension: Secondary | ICD-10-CM | POA: Insufficient documentation

## 2012-03-13 HISTORY — DX: Unspecified abdominal hernia without obstruction or gangrene: K46.9

## 2012-03-13 HISTORY — DX: Other chronic pain: G89.29

## 2012-03-13 HISTORY — DX: Headache, unspecified: R51.9

## 2012-03-13 HISTORY — DX: Chronic sinusitis, unspecified: J32.9

## 2012-03-13 HISTORY — DX: Essential (primary) hypertension: I10

## 2012-03-13 HISTORY — DX: Headache: R51

## 2012-03-13 LAB — BASIC METABOLIC PANEL
Calcium: 9.5 mg/dL (ref 8.4–10.5)
GFR calc Af Amer: >90 mL/min (ref >90)
GFR calc non Af Amer: >90 mL/min (ref >90)
Glucose, Bld: 132 mg/dL — ABNORMAL HIGH (ref 70–99)
Potassium: 3.7 mEq/L (ref 3.5–5.1)
Sodium: 137 mEq/L (ref 135–145)

## 2012-03-13 LAB — CBC WITH DIFFERENTIAL/PLATELET
Basophils Absolute: 0 10*3/uL (ref 0.0–0.1)
Basophils Relative: 0 % (ref 0–1)
Eosinophils Absolute: 0 10*3/uL (ref 0.0–0.7)
Hemoglobin: 15.7 g/dL — ABNORMAL HIGH (ref 12.0–15.0)
MCH: 32.5 pg (ref 26.0–34.0)
MCHC: 35.7 g/dL (ref 30.0–36.0)
Neutro Abs: 10.1 10*3/uL — ABNORMAL HIGH (ref 1.7–7.7)
Neutrophils Relative %: 88 % — ABNORMAL HIGH (ref 43–77)
Platelets: 201 10*3/uL (ref 150–400)
RDW: 12.5 % (ref 11.5–15.5)

## 2012-03-13 LAB — URINALYSIS, ROUTINE W REFLEX MICROSCOPIC
Hgb urine dipstick: NEGATIVE
Nitrite: NEGATIVE
Protein, ur: NEGATIVE mg/dL
Specific Gravity, Urine: 1.025 (ref 1.005–1.030)
Urobilinogen, UA: 0.2 mg/dL (ref 0.0–1.0)

## 2012-03-13 MED ORDER — KETOROLAC TROMETHAMINE 30 MG/ML IJ SOLN
30.0000 mg | Freq: Once | INTRAMUSCULAR | Status: AC
  Administered 2012-03-13: 30 mg via INTRAVENOUS
  Filled 2012-03-13: qty 1

## 2012-03-13 MED ORDER — METOCLOPRAMIDE HCL 5 MG/ML IJ SOLN
10.0000 mg | Freq: Once | INTRAMUSCULAR | Status: AC
  Administered 2012-03-13: 10 mg via INTRAVENOUS
  Filled 2012-03-13: qty 2

## 2012-03-13 MED ORDER — DEXAMETHASONE SODIUM PHOSPHATE 4 MG/ML IJ SOLN
10.0000 mg | Freq: Once | INTRAMUSCULAR | Status: AC
  Administered 2012-03-13: 10 mg via INTRAVENOUS
  Filled 2012-03-13: qty 3

## 2012-03-13 MED ORDER — DIPHENHYDRAMINE HCL 50 MG/ML IJ SOLN
25.0000 mg | Freq: Once | INTRAMUSCULAR | Status: AC
  Administered 2012-03-13: 25 mg via INTRAVENOUS
  Filled 2012-03-13: qty 1

## 2012-03-13 MED ORDER — SODIUM CHLORIDE 0.9 % IV BOLUS (SEPSIS)
1000.0000 mL | Freq: Once | INTRAVENOUS | Status: AC
  Administered 2012-03-13: 1000 mL via INTRAVENOUS

## 2012-03-13 NOTE — ED Provider Notes (Signed)
History    This chart was scribed for Mary B. Bernette Mayers, MD, MD by [name]. The patient was seen in room [facility]07 and the patient's care was started at 1:03PM.  CSN: [id number]  Arrival date & time 03/13/12  1229   First MD Initiated Contact with Patient 03/13/12 1300      Chief Complaint  Patient presents with  . Headache    (Consider location/radiation/quality/duration/timing/severity/associated sxs/prior treatment) Patient is a 46 y.o. female presenting with headaches. The history is provided by the patient.  Headache    [name] is a 46 y.o. female who presents to the Emergency Department complaining of moderate throbbing frontal headache onset 1 day ago. Pt reports hx of headaches of similar quality but this one is morse severe than past headaches. Pt has sensitivy to sound and photophobia. Pt reports having chills, diaphoresis, nausea, emesis and cough. Denies diarrhea, sore throat, rhinorrhea, dysuria. Reports that she smokes cigarettes.    Past Medical History  Diagnosis Date  . Chronic headaches   . Hernia   . Hypertension   . Sinusitis     Past Surgical History  Procedure Date  . Cesarean section     No family history on file.  History  Substance Use Topics  . Smoking status: Current Everyday Smoker  . Smokeless tobacco: Not on file  . Alcohol Use: No    OB History    Grav Para Term Preterm Abortions TAB SAB Ect Mult Living                  Review of Systems  Neurological: Positive for headaches.  All other systems reviewed and are negative.  10 Systems reviewed and all are negative for acute change except as noted in the HPI.   Allergies  Review of patient's allergies indicates no known allergies.  Home Medications  No current outpatient prescriptions on file.  BP 151/87  Pulse 93  Temp 98 F (36.7 C)  Resp 20  Ht 5\' 6"  (1.676 m)  Wt 145 lb (65.772 kg)  BMI 23.40 kg/m2  SpO2 98%  Physical Exam  Nursing note and vitals  reviewed. Constitutional: She is oriented to person, place, and time. She appears well-developed and well-nourished.  HENT:  Head: Normocephalic and atraumatic.  Eyes: Conjunctivae and EOM are normal. Pupils are equal, round, and reactive to light.  Neck: Normal range of motion. Neck supple.       No meningismus     Cardiovascular: Normal rate, normal heart sounds and intact distal pulses.   Pulmonary/Chest: Effort normal and breath sounds normal.  Abdominal: Bowel sounds are normal. She exhibits no distension. There is no tenderness.  Musculoskeletal: Normal range of motion. She exhibits no edema and no tenderness.  Neurological: She is alert and oriented to person, place, and time. She has normal strength. No cranial nerve deficit or sensory deficit. She exhibits normal muscle tone. Coordination normal.  Skin: Skin is warm and dry. No rash noted.  Psychiatric: She has a normal mood and affect. Her behavior is normal.    ED Course  Procedures (including critical care time) DIAGNOSTIC STUDIES: Oxygen Saturation is 98% on room air, normal by my interpretation.    COORDINATION OF CARE: 1:09PM EDP discusses pt ED treatment with pt  1:15PM EDP ordered medication:  Scheduled Meds:    . dexamethasone  10 mg Intravenous Once  . diphenhydrAMINE  25 mg Intravenous Once  . ketorolac  30 mg Intravenous Once  . metoCLOPramide (  REGLAN) injection  10 mg Intravenous Once  . sodium chloride  1,000 mL Intravenous Once   Continuous Infusions:  PRN Meds:.    Labs Reviewed  CBC WITH DIFFERENTIAL - Abnormal; Notable for the following:    WBC 11.5 (*)     Hemoglobin 15.7 (*)     Neutrophils Relative 88 (*)     Neutro Abs 10.1 (*)     Lymphocytes Relative 9 (*)     All other components within normal limits  URINALYSIS, ROUTINE W REFLEX MICROSCOPIC - Abnormal; Notable for the following:    Ketones, ur 15 (*)     All other components within normal limits  BASIC METABOLIC PANEL - Abnormal;  Notable for the following:    Glucose, Bld 132 (*)     All other components within normal limits   Dg Chest 2 View  03/13/2012  *RADIOLOGY REPORT*  Clinical Data: Epigastric pain, cough, fever  CHEST - 2 VIEW  Comparison: 06/17/2010  Findings: Cardiomediastinal silhouette is stable.  No acute infiltrate or pleural effusion.  No pulmonary edema.  Bony thorax is stable.  IMPRESSION: No active disease.  No significant change.  Original Report Authenticated By: Natasha Mead, M.D.     No diagnosis found.    MDM  IVF, meds, will check for source of subjective fever as patient is clammy and warm on exam.   I personally performed the services described in the documentation, which were scribed in my presence. The recorded information has been reviewed and considered.    3:23 PM Headache resolved. Labs and imaging unremarkable. Pt feeling better and ready to go home.      Mary B. Bernette Mayers, MD 03/13/12 1531

## 2012-03-13 NOTE — ED Notes (Addendum)
Pt reports has frequent headaches and this one started last night.  Reports n/v.    Says this headache is more severe than usual.

## 2012-03-15 ENCOUNTER — Encounter (HOSPITAL_COMMUNITY): Payer: Self-pay | Admitting: Emergency Medicine

## 2012-03-15 ENCOUNTER — Emergency Department (HOSPITAL_COMMUNITY)
Admission: EM | Admit: 2012-03-15 | Discharge: 2012-03-15 | Disposition: A | Discharge status: Home / Self Care | Payer: Self-pay | Attending: Emergency Medicine | Admitting: Emergency Medicine

## 2012-03-15 DIAGNOSIS — R1013 Epigastric pain: Secondary | ICD-10-CM | POA: Insufficient documentation

## 2012-03-15 DIAGNOSIS — I10 Essential (primary) hypertension: Secondary | ICD-10-CM | POA: Insufficient documentation

## 2012-03-15 DIAGNOSIS — R112 Nausea with vomiting, unspecified: Secondary | ICD-10-CM | POA: Insufficient documentation

## 2012-03-15 DIAGNOSIS — R109 Unspecified abdominal pain: Secondary | ICD-10-CM

## 2012-03-15 DIAGNOSIS — F17200 Nicotine dependence, unspecified, uncomplicated: Secondary | ICD-10-CM | POA: Insufficient documentation

## 2012-03-15 MED ORDER — HYDROMORPHONE HCL PF 1 MG/ML IJ SOLN: 1.0000 mg | Freq: Once | INTRAMUSCULAR | Status: AC

## 2012-03-15 MED ORDER — PROMETHAZINE HCL 25 MG RE SUPP: 25.0000 mg | Freq: Four times a day (QID) | RECTAL | Status: DC | PRN

## 2012-03-15 MED ORDER — HYDROMORPHONE HCL PF 2 MG/ML IJ SOLN
INTRAMUSCULAR | Status: AC
  Administered 2012-03-15: 1 mg
  Filled 2012-03-15: qty 1

## 2012-03-15 MED ORDER — PANTOPRAZOLE SODIUM 40 MG IV SOLR
40.0000 mg | Freq: Once | INTRAVENOUS | Status: AC
  Administered 2012-03-15: 40 mg via INTRAVENOUS
  Filled 2012-03-15: qty 40

## 2012-03-15 MED ORDER — HYDROMORPHONE HCL PF 1 MG/ML IJ SOLN
1.0000 mg | Freq: Once | INTRAMUSCULAR | Status: AC
  Administered 2012-03-15: 1 mg via INTRAVENOUS
  Filled 2012-03-15: qty 1

## 2012-03-15 MED ORDER — SODIUM CHLORIDE 0.9 % IV BOLUS (SEPSIS)
1000.0000 mL | Freq: Once | INTRAVENOUS | Status: AC
  Administered 2012-03-15: 1000 mL via INTRAVENOUS

## 2012-03-15 MED ORDER — HYDROCODONE-ACETAMINOPHEN 5-325 MG PO TABS: 1.0000 | ORAL_TABLET | ORAL | Status: AC | PRN

## 2012-03-15 MED ORDER — ONDANSETRON 8 MG PO TBDP
8.0000 mg | ORAL_TABLET | Freq: Once | ORAL | Status: AC
  Administered 2012-03-15: 8 mg via ORAL

## 2012-03-15 MED ORDER — ONDANSETRON 8 MG PO TBDP
ORAL_TABLET | ORAL | Status: AC
  Filled 2012-03-15: qty 1

## 2012-03-15 MED ORDER — SODIUM CHLORIDE 0.9 % IV SOLN
Freq: Once | INTRAVENOUS | Status: AC
  Administered 2012-03-15: 06:00:00 via INTRAVENOUS

## 2012-03-15 MED ORDER — PROMETHAZINE HCL 25 MG PO TABS: 12.5000 mg | ORAL_TABLET | Freq: Four times a day (QID) | ORAL | Status: DC | PRN

## 2012-03-15 MED ORDER — FAMOTIDINE IN NACL 20-0.9 MG/50ML-% IV SOLN
20.0000 mg | INTRAVENOUS | Status: AC
  Administered 2012-03-15: 20 mg via INTRAVENOUS
  Filled 2012-03-15: qty 50

## 2012-03-15 NOTE — ED Notes (Signed)
Patient complaining of nausea, vomiting, and epigastric pain. States that she was seen Saturday for same. Also complaining of burning sensation from vomiting.

## 2012-03-15 NOTE — ED Notes (Signed)
Dr. Colon Branch notified that patient vomited dark brown emesis approximately 5 minutes after receiving zofran odt.

## 2012-03-15 NOTE — ED Provider Notes (Signed)
History     CSN: [id number]  Arrival date & time 03/15/12  0548   First MD Initiated Contact with Patient 03/15/12 0557      Chief Complaint  Patient presents with  . Emesis  . Abdominal Pain    (Consider location/radiation/quality/duration/timing/severity/associated sxs/prior treatment) HPI  [name] is a 46 y.o. female who presents to the Emergency Department complaining of persistent vomiting and nausea since being seen here on Saturday for  Headache associated with both. Last vomited just before arrival. Denies fever, chills. Has upper abdominal pain that has been present since Saturday. Denies diarrhea.  No PCP  Past Medical History  Diagnosis Date  . Chronic headaches   . Hernia   . Hypertension   . Sinusitis     Past Surgical History  Procedure Date  . Cesarean section   . Abdominal hysterectomy     History reviewed. No pertinent family history.  History  Substance Use Topics  . Smoking status: Current Everyday Smoker  . Smokeless tobacco: Not on file  . Alcohol Use: No    OB History    Grav Para Term Preterm Abortions TAB SAB Ect Mult Living                  Review of Systems  Constitutional: Negative for fever.       10 Systems reviewed and are negative for acute change except as noted in the HPI.  HENT: Negative for congestion.   Eyes: Negative for discharge and redness.  Respiratory: Negative for cough and shortness of breath.   Cardiovascular: Negative for chest pain.  Gastrointestinal: Positive for nausea, vomiting and abdominal pain.  Musculoskeletal: Negative for back pain.  Skin: Negative for rash.  Neurological: Negative for syncope, numbness and headaches.  Psychiatric/Behavioral:       No behavior change.    Allergies  Review of patient's allergies indicates no known allergies.  Home Medications   Current Outpatient Rx  Name Route Sig Dispense Refill  . GOODY HEADACHE PO Oral Take 1 packet by mouth as needed. For  pain/headaches    . RANITIDINE HCL 150 MG PO TABS Oral Take 150 mg by mouth 2 (two) times daily.      BP 177/98  Pulse 90  Temp 98.3 F (36.8 C) (Oral)  Resp 20  Ht 5\' 6"  (1.676 m)  Wt 145 lb (65.772 kg)  BMI 23.40 kg/m2  SpO2 99%  Physical Exam  Nursing note and vitals reviewed. Constitutional: She appears well-developed and well-nourished.       Awake, alert, nontoxic appearance.  HENT:  Head: Atraumatic.  Eyes: Conjunctivae and EOM are normal. Pupils are equal, round, and reactive to light. Right eye exhibits no discharge. Left eye exhibits no discharge.  Neck: Neck supple.  Cardiovascular: Normal heart sounds.   Pulmonary/Chest: Effort normal and breath sounds normal. She exhibits no tenderness.  Abdominal: Soft. There is tenderness. There is no rebound.       Tenderness to epigastric and LUQ with palpation.  Musculoskeletal: Normal range of motion. She exhibits no tenderness.       Baseline ROM, no obvious new focal weakness.  Neurological: She is alert.       Mental status and motor strength appears baseline for patient and situation.  Skin: No rash noted.  Psychiatric: She has a normal mood and affect.    ED Course  Procedures (including critical care time)  Labs Reviewed - No data to display Dg Chest 2 View  03/13/2012  *RADIOLOGY REPORT*  Clinical Data: Epigastric pain, cough, fever  CHEST - 2 VIEW  Comparison: 06/17/2010  Findings: Cardiomediastinal silhouette is stable.  No acute infiltrate or pleural effusion.  No pulmonary edema.  Bony thorax is stable.  IMPRESSION: No active disease.  No significant change.  Original Report Authenticated By: Natasha Mead, M.D.     No diagnosis found.    MDM  Patient with persistent nausea and vomiting since Saturday now with abdominal pain. Received IVF, analgesic x 2 , PPI, pepcid. Patient with improvement. Pt feels improved after observation and/or treatment in ED.Pt stable in ED with no significant deterioration in  condition.The patient appears reasonably screened and/or stabilized for discharge and I doubt any other medical condition or other [facility] requiring further screening, evaluation, or treatment in the ED at this time prior to discharge.  MDM Reviewed: nursing note, vitals and previous chart Reviewed previous: labs and x-ray Interpretation: x-ray           Nicoletta Dress. Colon Branch, MD 03/15/12 [phone]

## 2012-07-01 IMAGING — CT CT ABD-PELV W/ CM
1 of 3 series · 14 of 32 positions shown, 19 images · IV contrast (OMNIPAQUE)
Comparison: 06/18/2010

CLINICAL DATA: Persistent abdominal and pelvic pain

CT ABDOMEN AND PELVIS WITH CONTRAST
TECHNIQUE: Multidetector CT imaging of the abdomen and pelvis was
performed following the standard protocol during bolus
administration of intravenous contrast.
Contrast: 100 ml of omni 300

[Series 2: routine abdomen/pelvis with · axial · 0.74mm/px · z∈[-491,-81]mm · 14 of 94 slices shown, 19 images]
[im 6/94  soft-tissue]
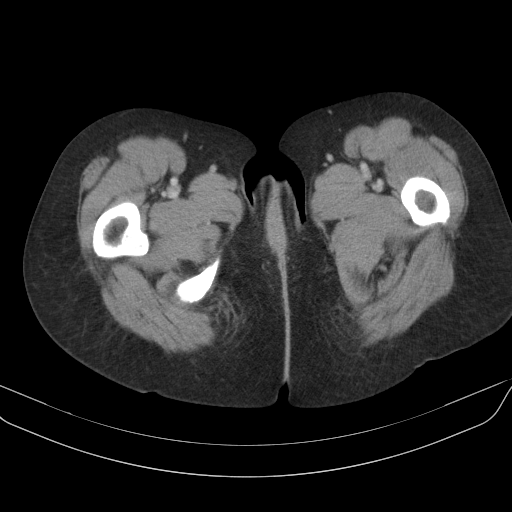
[im 6/94  bone]
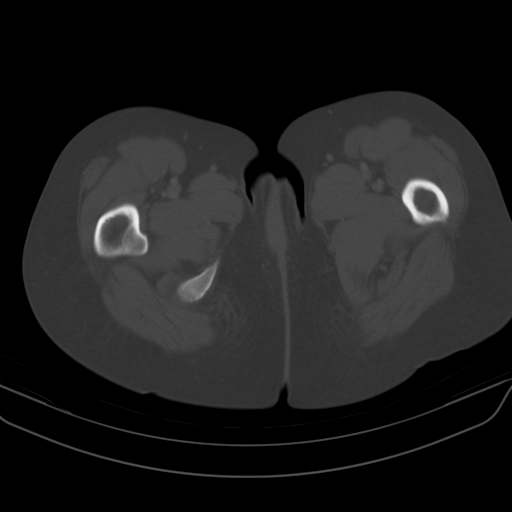
[im 11/94  soft-tissue]
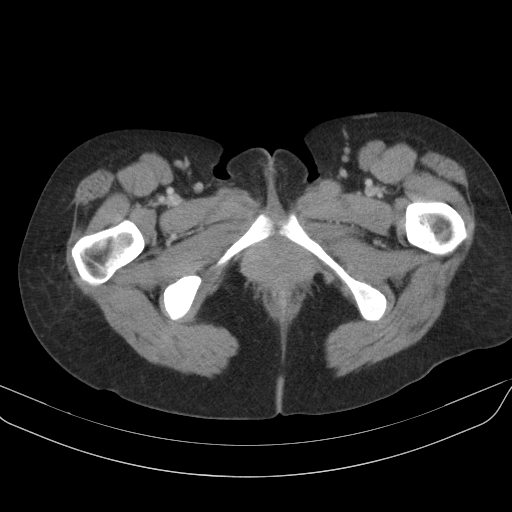
[im 21/94  soft-tissue]
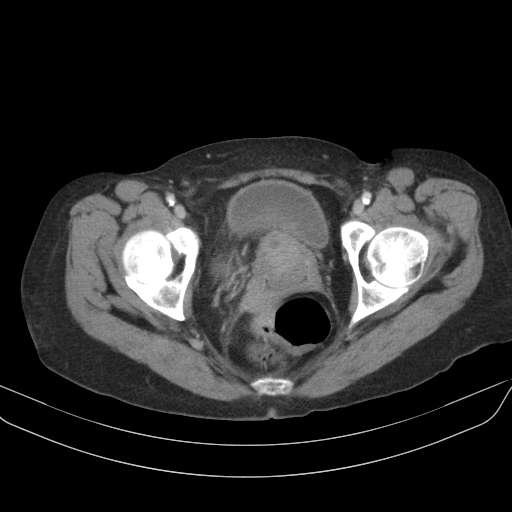
[im 26/94  soft-tissue]
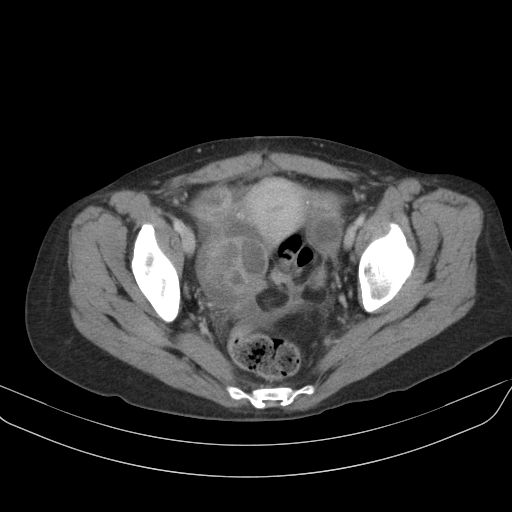
[im 32/94  soft-tissue]
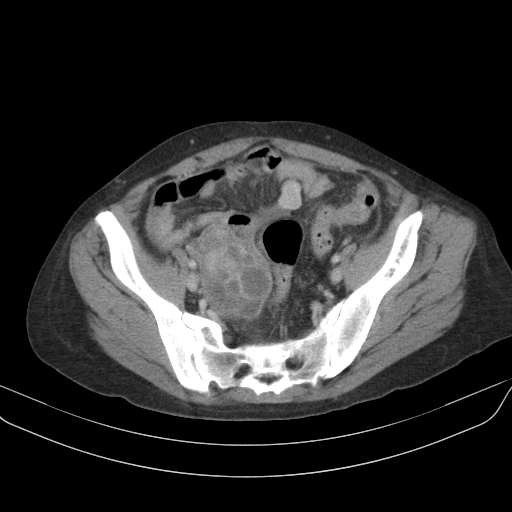
[im 42/94  soft-tissue]
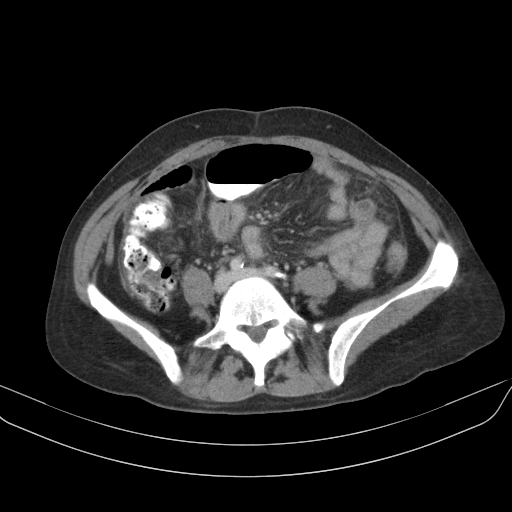
[im 47/94  soft-tissue]
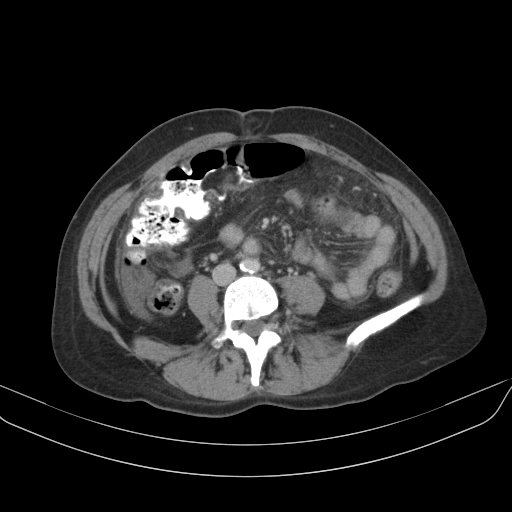
[im 52/94  soft-tissue]
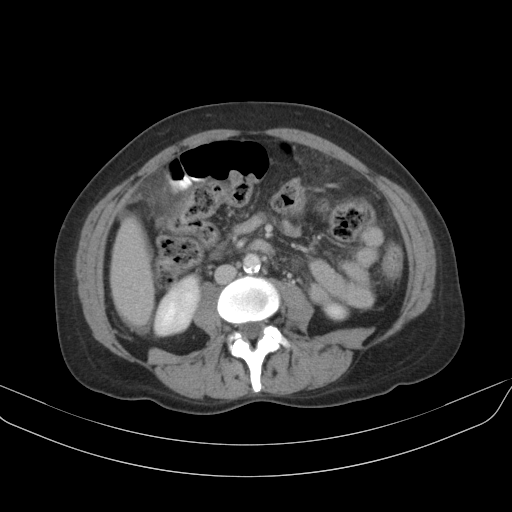
[im 63/94  soft-tissue]
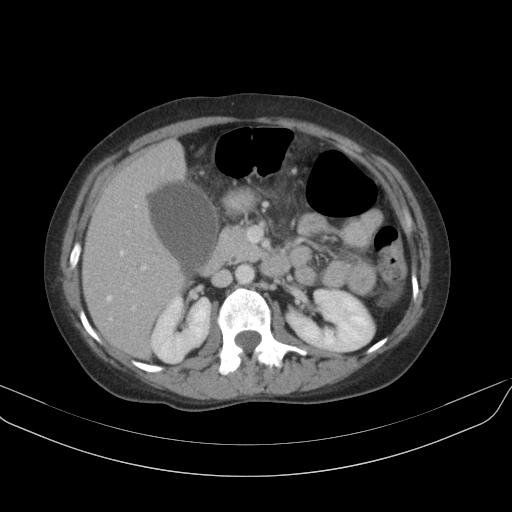
[im 63/94  bone]
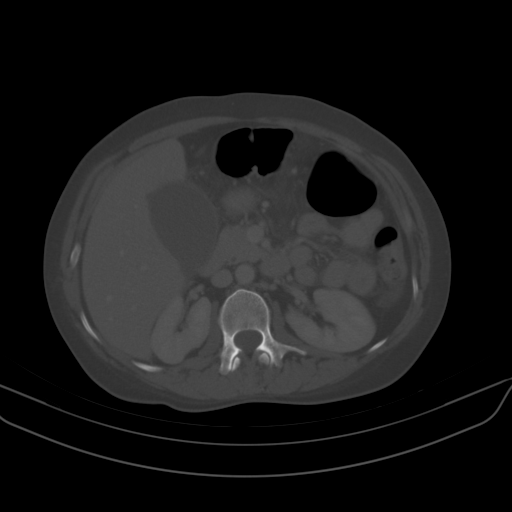
[im 68/94  soft-tissue]
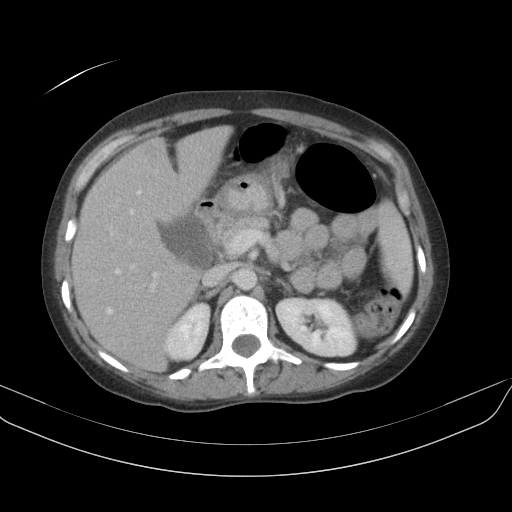
[im 73/94  soft-tissue]
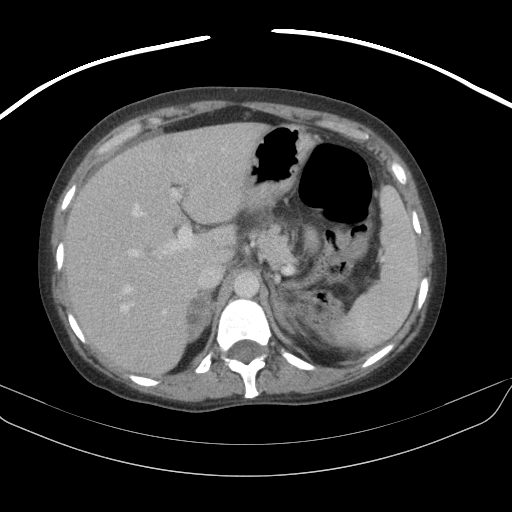
[im 73/94  lung]
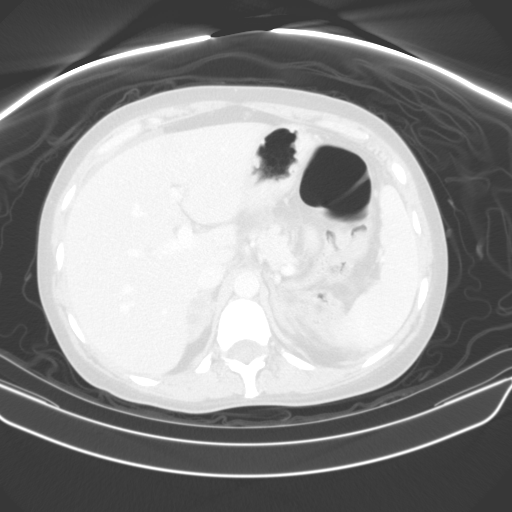
[im 78/94  lung]
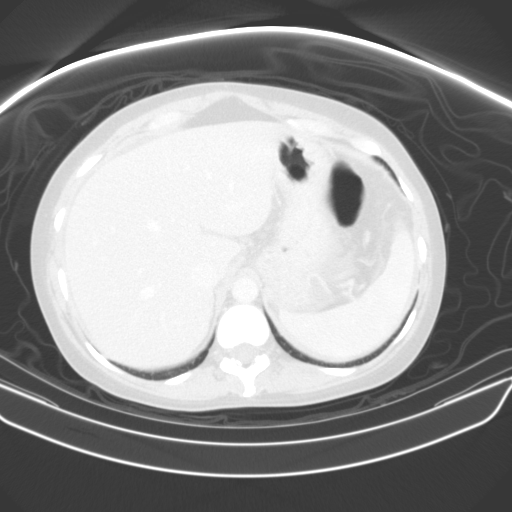
[im 83/94  soft-tissue]
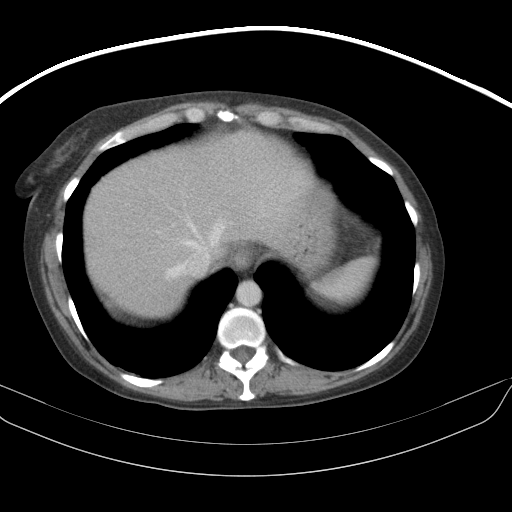
[im 83/94  lung]
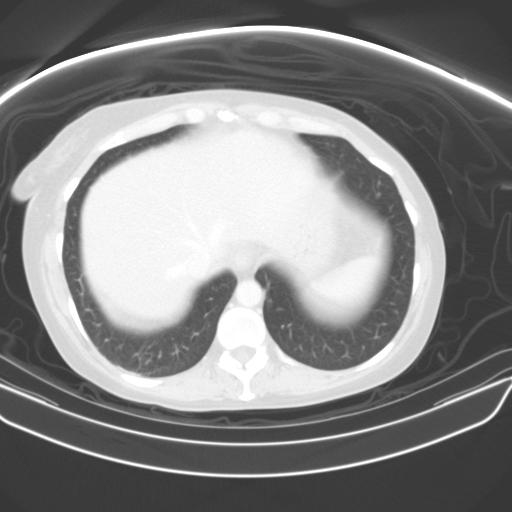
[im 88/94  soft-tissue]
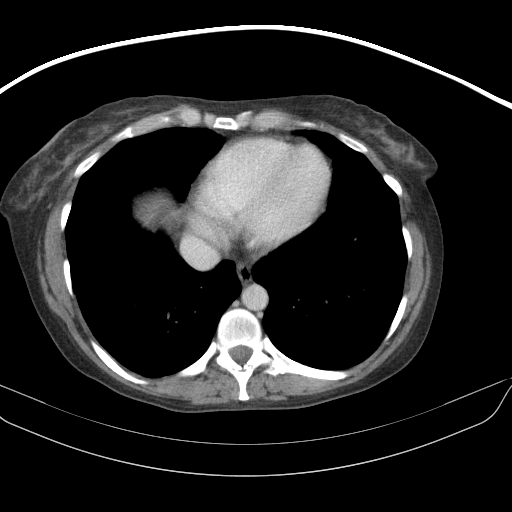
[im 88/94  lung]
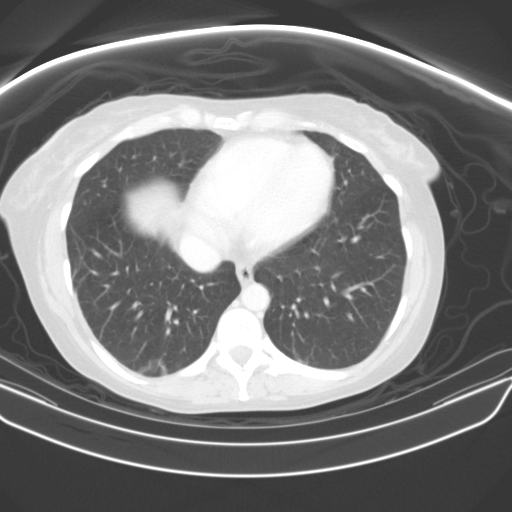

[14 of 32 positions shown; findings below may reference images not displayed]

FINDINGS: The lung bases appear clear.

No pericardial or pleural effusion identified.

The spleen is normal.

There is mild upper abdominal ascites.

Mild pericholecystic fluid is identified.  The gallbladder is
otherwise normal.  There is no biliary dilatation.

Pancreas appears normal.

The left adrenal gland is normal.  There are several nodules within
the right adrenal gland consistent with adenomas.

There are no focal liver abnormalities identified.
Both kidneys appear normal.

There is no upper abdominal retroperitoneal adenopathy.

The stomach appears normal.
The small bowel loops have a normal appearance

The colon appears normal in caliber.

The uterus has a normal appearance

The urinary bladder is normal.

There is a complex cystic structure within the right adnexa
measuring 8.5 x 5.7 cm, image 66.  There are multiple internal
areas of septation.  The ultrasound from 07/25/2010 describes this
structure as an enlarged ovary containing multiple prominent
follicles.  The left ovary is slightly smaller than the right but
contains several enlarged follicles that measure up to 2.5 cm.

There is a small amount of free fluid within the pelvis.

There are no enlarged lymph nodes within the upper abdomen.

Review of the visualized osseous structures is unremarkable.
IMPRESSION: 1.  Upper abdominal  ascites.
2.  There is a bilateral enlargement of the ovaries.  Ultrasound
study from 07/25/2010 describes a bilateral ovarian enlargement
with multiple enlarged follicles.  These findings may be due to
ovarian hyperstimulation.
3.  Small amount of pelvic ascites.
4.  Right adrenal nodules.  Likely adenomas.

## 2013-03-31 ENCOUNTER — Emergency Department (HOSPITAL_COMMUNITY)
Admission: EM | Admit: 2013-03-31 | Discharge: 2013-04-01 | Disposition: A | Discharge status: Home / Self Care | Payer: Self-pay | Attending: Emergency Medicine | Admitting: Emergency Medicine

## 2013-03-31 ENCOUNTER — Emergency Department (HOSPITAL_COMMUNITY)
Admission: EM | Admit: 2013-03-31 | Discharge: 2013-03-31 | Disposition: A | Discharge status: Home / Self Care | Payer: Self-pay | Attending: Emergency Medicine | Admitting: Emergency Medicine

## 2013-03-31 ENCOUNTER — Encounter (HOSPITAL_COMMUNITY): Payer: Self-pay | Admitting: Emergency Medicine

## 2013-03-31 ENCOUNTER — Emergency Department (HOSPITAL_COMMUNITY): Payer: Self-pay

## 2013-03-31 ENCOUNTER — Encounter (HOSPITAL_COMMUNITY): Payer: Self-pay

## 2013-03-31 DIAGNOSIS — F17200 Nicotine dependence, unspecified, uncomplicated: Secondary | ICD-10-CM | POA: Insufficient documentation

## 2013-03-31 DIAGNOSIS — Z79899 Other long term (current) drug therapy: Secondary | ICD-10-CM | POA: Insufficient documentation

## 2013-03-31 DIAGNOSIS — Z90710 Acquired absence of both cervix and uterus: Secondary | ICD-10-CM | POA: Insufficient documentation

## 2013-03-31 DIAGNOSIS — K859 Acute pancreatitis without necrosis or infection, unspecified: Secondary | ICD-10-CM

## 2013-03-31 DIAGNOSIS — J3489 Other specified disorders of nose and nasal sinuses: Secondary | ICD-10-CM | POA: Insufficient documentation

## 2013-03-31 DIAGNOSIS — J329 Chronic sinusitis, unspecified: Secondary | ICD-10-CM

## 2013-03-31 DIAGNOSIS — Z9889 Other specified postprocedural states: Secondary | ICD-10-CM | POA: Insufficient documentation

## 2013-03-31 DIAGNOSIS — Z8719 Personal history of other diseases of the digestive system: Secondary | ICD-10-CM | POA: Insufficient documentation

## 2013-03-31 DIAGNOSIS — Z8709 Personal history of other diseases of the respiratory system: Secondary | ICD-10-CM | POA: Insufficient documentation

## 2013-03-31 DIAGNOSIS — R112 Nausea with vomiting, unspecified: Secondary | ICD-10-CM | POA: Insufficient documentation

## 2013-03-31 DIAGNOSIS — F411 Generalized anxiety disorder: Secondary | ICD-10-CM | POA: Insufficient documentation

## 2013-03-31 DIAGNOSIS — R51 Headache: Secondary | ICD-10-CM | POA: Insufficient documentation

## 2013-03-31 DIAGNOSIS — R34 Anuria and oliguria: Secondary | ICD-10-CM | POA: Insufficient documentation

## 2013-03-31 DIAGNOSIS — G8929 Other chronic pain: Secondary | ICD-10-CM | POA: Insufficient documentation

## 2013-03-31 DIAGNOSIS — Z720 Tobacco use: Secondary | ICD-10-CM

## 2013-03-31 DIAGNOSIS — R630 Anorexia: Secondary | ICD-10-CM | POA: Insufficient documentation

## 2013-03-31 DIAGNOSIS — I10 Essential (primary) hypertension: Secondary | ICD-10-CM | POA: Insufficient documentation

## 2013-03-31 DIAGNOSIS — R6883 Chills (without fever): Secondary | ICD-10-CM | POA: Insufficient documentation

## 2013-03-31 DIAGNOSIS — Z8669 Personal history of other diseases of the nervous system and sense organs: Secondary | ICD-10-CM | POA: Insufficient documentation

## 2013-03-31 HISTORY — DX: Bell's palsy: G51.0

## 2013-03-31 HISTORY — DX: Acute pancreatitis without necrosis or infection, unspecified: K85.90

## 2013-03-31 LAB — COMPREHENSIVE METABOLIC PANEL
Alkaline Phosphatase: 66 U/L (ref 39–117)
BUN: 16 mg/dL (ref 6–23)
CO2: 26 mEq/L (ref 19–32)
Chloride: 102 mEq/L (ref 96–112)
GFR calc Af Amer: >90 mL/min (ref >90)
GFR calc non Af Amer: >90 mL/min (ref >90)
Glucose, Bld: 123 mg/dL — ABNORMAL HIGH (ref 70–99)
Potassium: 3.7 mEq/L (ref 3.5–5.1)
Total Bilirubin: 0.6 mg/dL (ref 0.3–1.2)

## 2013-03-31 LAB — CBC WITH DIFFERENTIAL/PLATELET
Eosinophils Relative: 2 % (ref 0–5)
HCT: 45.5 % (ref 36.0–46.0)
Hemoglobin: 16 g/dL — ABNORMAL HIGH (ref 12.0–15.0)
Lymphocytes Relative: 24 % (ref 12–46)
MCHC: 35.2 g/dL (ref 30.0–36.0)
MCV: 92.5 fL (ref 78.0–100.0)
Monocytes Absolute: 0.7 10*3/uL (ref 0.1–1.0)
Monocytes Relative: 6 % (ref 3–12)
Neutro Abs: 7.2 10*3/uL (ref 1.7–7.7)

## 2013-03-31 LAB — URINALYSIS, ROUTINE W REFLEX MICROSCOPIC
Bilirubin Urine: NEGATIVE
Hgb urine dipstick: NEGATIVE
Specific Gravity, Urine: 1.01 (ref 1.005–1.030)
pH: 6 (ref 5.0–8.0)

## 2013-03-31 LAB — LIPASE, BLOOD: Lipase: 115 U/L — ABNORMAL HIGH (ref 11–59)

## 2013-03-31 MED ORDER — SODIUM CHLORIDE 0.9 % IV SOLN
INTRAVENOUS | Status: DC
  Administered 2013-03-31: 14:00:00 via INTRAVENOUS

## 2013-03-31 MED ORDER — ONDANSETRON HCL 8 MG PO TABS: 8.0000 mg | ORAL_TABLET | Freq: Three times a day (TID) | ORAL | Status: DC | PRN

## 2013-03-31 MED ORDER — FLUTICASONE PROPIONATE 50 MCG/ACT NA SUSP: 2.0000 | Freq: Every day | NASAL | Status: DC

## 2013-03-31 MED ORDER — HYDROCODONE-ACETAMINOPHEN 5-325 MG PO TABS: 1.0000 | ORAL_TABLET | ORAL | Status: DC | PRN

## 2013-03-31 MED ORDER — SODIUM CHLORIDE 0.9 % IV BOLUS (SEPSIS)
500.0000 mL | Freq: Once | INTRAVENOUS | Status: AC
  Administered 2013-03-31: 500 mL via INTRAVENOUS

## 2013-03-31 MED ORDER — HYDROMORPHONE HCL PF 1 MG/ML IJ SOLN
1.0000 mg | Freq: Once | INTRAMUSCULAR | Status: AC
  Administered 2013-03-31: 1 mg via INTRAVENOUS
  Filled 2013-03-31: qty 1

## 2013-03-31 MED ORDER — ONDANSETRON HCL 4 MG/2ML IJ SOLN
4.0000 mg | Freq: Once | INTRAMUSCULAR | Status: AC
  Administered 2013-03-31: 4 mg via INTRAVENOUS
  Filled 2013-03-31: qty 2

## 2013-03-31 NOTE — ED Provider Notes (Signed)
CSN: [id number]     Arrival date & time 03/31/13  1228 History  This chart was scribed for Flint Melter, MD by Bennett Scrape, ED Scribe. This patient was seen in room APA07/APA07 and the patient's care was started at 1:20 PM.   Chief Complaint  Patient presents with  . Abdominal Pain  . Facial Droop    The history is provided by the patient. No language interpreter was used.   HPI Comments: [name] is a 47 y.o. female who presents to the Emergency Department complaining of 2 to 3 days of nausea with associated emesis, nasal congestion, HA and epigastric abdominal pain. She reports decreased appettie secondary to the nausea and decreased urine output since yesterday. She reports that she has been taking OTC medications with no improvement. Niece reports that she gave the pt her prescribed Xanax and phenergan with no improvement. Niece states that the pt has had facial drooping and aphasia for the past few weeks. She states that her grandfather had similar symptoms diagnosed as a TIA. She has experienced 2 prior episodes of Bell's palsy, last episode was last year, but denies similarities. Pt denies diarrhea, CP and SOB as associated symptoms. Pt states that she has anxiety but denies being on any current medications. She also reports having a h/o manic depression but is not on any medications due to financial reasons. Pt is a current everyday smoker and occasional alcohol user.     Past Medical History  Diagnosis Date  . Chronic headaches   . Hernia   . Hypertension   . Sinusitis   . Bell palsy    Past Surgical History  Procedure Laterality Date  . Cesarean section    . Abdominal hysterectomy     Family History  Problem Relation Age of Onset  . Cancer Father   . Diabetes Other    History  Substance Use Topics  . Smoking status: Current Every Day Smoker -- 1.00 packs/day for 20 years    Types: Cigarettes  . Smokeless tobacco: Never Used  . Alcohol Use: No   OB  History   Grav Para Term Preterm Abortions TAB SAB Ect Mult Living   2 2 2       2      Review of Systems  Respiratory: Negative for shortness of breath.   Cardiovascular: Negative for chest pain.  Gastrointestinal: Positive for nausea, vomiting and abdominal pain. Negative for diarrhea.  All other systems reviewed and are negative.    Allergies  Review of patient's allergies indicates no known allergies.  Home Medications   Current Outpatient Rx  Name  Route  Sig  Dispense  Refill  . aspirin-acetaminophen-caffeine (EXCEDRIN MIGRAINE) 250-250-65 MG per tablet   Oral   Take 1 tablet by mouth every 6 (six) hours as needed for pain.         Marland Kitchen Phenyleph-Doxylamine-DM-APAP (ALKA-SELTZER PLS ALLERGY & C[facility] PO)   Oral   Take 2 capsules by mouth every 6 (six) hours as needed (allergies/cough).         . ranitidine (ZANTAC) 150 MG tablet   Oral   Take 150 mg by mouth 2 (two) times daily.          Triage Vitals: BP 154/116  Pulse 109  Temp(Src) 99.7 F (37.6 C) (Oral)  Resp 18  Ht 5\' 7"  (1.702 m)  Wt 147 lb (66.679 kg)  BMI 23.02 kg/m2  SpO2 96%  Physical Exam  Nursing note and vitals  reviewed. Constitutional: She is oriented to person, place, and time. She appears well-developed and well-nourished.  Pt is tearful  HENT:  Head: Normocephalic and atraumatic.  Eyes: Conjunctivae and EOM are normal. Pupils are equal, round, and reactive to light.  Neck: Normal range of motion and phonation normal. Neck supple.  Cardiovascular: Normal rate, regular rhythm and intact distal pulses.   Pulmonary/Chest: Effort normal and breath sounds normal. She exhibits no tenderness.  Abdominal: Soft. She exhibits no distension. There is tenderness (epigastrium ). There is no guarding.  Musculoskeletal: Normal range of motion.  Neurological: She is alert and oriented to person, place, and time. She has normal strength. She exhibits normal muscle tone.  5/5 strength throughout  Skin: Skin  is warm and dry.  Psychiatric: Her behavior is normal. Judgment and thought content normal. Her mood appears anxious.    ED Course   Medications  0.9 %  sodium chloride infusion ( Intravenous New Bag/Given 03/31/13 1347)  sodium chloride 0.9 % bolus 500 mL (0 mL Intravenous Stopped 03/31/13 1447)  HYDROmorphone (DILAUDID) injection 1 mg (1 mg Intravenous Given 03/31/13 1347)  ondansetron (ZOFRAN) injection 4 mg (4 mg Intravenous Given 03/31/13 1347)    DIAGNOSTIC STUDIES: Oxygen Saturation is 96% on room air, normal by my interpretation.    COORDINATION OF CARE: 1:26 PM-Discussed treatment plan which includes medications, CXR, CT of head, [place] of abdomen, CBC panel, CMP and UA with pt at bedside and pt agreed to plan.   3:20 PM-Pt rechecked and feels improved with medications listed above. Informed pt of negative radiology reports and slightly elevated lipase. Pt denies excessive or frequent alcohol use. Advised pt that we are awaiting on the results of the UA just submitted. Informed pt that the diagnosis will most likely be pancreatitis and stated that she will need PCP follow up. Pt was provided with resources.   Procedures (including critical care time)   Date: 03/31/2013  Rate: 87  Rhythm: normal sinus rhythm  QRS Axis: normal  Intervals: normal  ST/T Wave abnormalities: normal  Conduction Disutrbances:none  Narrative Interpretation:   Old EKG Reviewed: none available  Labs Reviewed  CBC WITH DIFFERENTIAL - Abnormal; Notable for the following:    WBC 10.7 (*)    Hemoglobin 16.0 (*)    All other components within normal limits  COMPREHENSIVE METABOLIC PANEL - Abnormal; Notable for the following:    Glucose, Bld 123 (*)    All other components within normal limits  LIPASE, BLOOD - Abnormal; Notable for the following:    Lipase 115 (*)    All other components within normal limits  URINALYSIS, ROUTINE W REFLEX MICROSCOPIC   Dg Chest 2 View  03/31/2013   *RADIOLOGY REPORT*   Clinical Data: Pain and fever  CHEST - 2 VIEW  Comparison:  March 13, 2012  Findings:  Lungs clear.  Heart size and pulmonary vascularity are normal.  No adenopathy.  No bone lesions.  IMPRESSION: No abnormality noted.   Original Report Authenticated By: Bretta Bang, M.D.   Ct Head Wo Contrast  03/31/2013   *RADIOLOGY REPORT*  Clinical Data: Headache, nausea and right sided facial droop. Bell's palsy.  CT HEAD WITHOUT CONTRAST  Technique:  Contiguous axial images were obtained from the base of the skull through the vertex without contrast.  Comparison: None.  Findings: No evidence of acute infarct, acute hemorrhage, mass lesion, mass effect or hydrocephalus.  Visualized portions of the paranasal sinuses show scattered mucosal thickening.  No air fluid  levels.  Trace fluid in the right mastoid air cells.  IMPRESSION:  1.  No acute intracranial abnormality. 2.  Minimal scattered mucosal thickening in the ethmoid air cells. 3.  Trace right mastoid effusion.   Original Report Authenticated By: Leanna Battles, M.D.   US Abdomen Complete  03/31/2013   *RADIOLOGY REPORT*  Clinical Data:  Abdominal pain  COMPLETE ABDOMINAL ULTRASOUND  Comparison:  None.  Findings:  Gallbladder:  No gallstones, gallbladder wall thickening, or pericholecystic fluid.  Common bile duct:  3 mm  Liver:  No focal lesion identified.  Within normal limits in parenchymal echogenicity.  IVC:  Appears normal.  Pancreas:  Not well visualized due to overlying bowel gas.  Spleen:  8.3 cm.  No mass lesion is noted.  Right Kidney:  10.6 cm.  No mass lesion or hydronephrosis is seen.  Left Kidney:  10.8 cm.  No mass lesion or hydronephrosis is noted.  Abdominal aorta:  No aneurysm identified.  A 2.1 cm hypoechoic region is noted superior to the right kidney which corresponds to a known right adrenal mass.  IMPRESSION: Stable right adrenal mass.  No other focal abnormality is seen.   Original Report Authenticated By: Alcide Clever, M.D.   1.  Pancreatitis   2. Sinusitis     MDM  Abdominal pain, likely secondary to pancreatitis. Patient denies drinking alcohol heavily. She has not previously had pancreatitis. No evidence for gallbladder disease. CT scan does not indicate CVA. It does indicate mild sinusitis. She does not have clinical evidence of acute CVA. She is improved with treatment here. She is able to ambulate easily. Doubt metabolic instability, serious bacterial infection or impending vascular collapse; the patient is stable for discharge.  Nursing Notes Reviewed/ Care Coordinated, and agree without changes. Applicable Imaging Reviewed.  Interpretation of Laboratory Data incorporated into ED treatment   Plan: Home Medications- Norco, Flonase, Zofran; Home Treatments and Observation- gradually advance diet, watch for progressive symptoms; return here if the recommended treatment, does not improve the symptoms; Recommended follow up- to followup with a primary care doctor in one or 2 weeks     I personally performed the services described in this documentation, which was scribed in my presence. The recorded information has been reviewed and is accurate.     Flint Melter, MD 03/31/13 [phone]

## 2013-03-31 NOTE — ED Notes (Signed)
Patient c/o facial "drawing" x3 weeks. Per patient diagnosed with bells' palsy "a few times." Family states that patient has difficulty get thoughts out and reports memory impairment. Patient also c/o  Upper abd pain with nausea and vomiting x2 days. Reports diarrhea the first day abd pain started, but denies any now. Reports low grade fever. Denies any urinary symptoms.

## 2013-03-31 NOTE — ED Notes (Signed)
Pt seen here earlier, told she had pancreatitis, sent home with prescriptions for pain and nausea, states she couldn't afford to get meds filled and is back now because she continues to be sick.

## 2013-03-31 NOTE — ED Notes (Signed)
MD at bedside. 

## 2013-03-31 NOTE — ED Notes (Signed)
Pt alert & oriented x4, stable gait. Patient given discharge instructions, paperwork & prescription(s). Patient  instructed to stop at the registration desk to finish any additional paperwork. Patient verbalized understanding. Pt left department w/ no further questions. 

## 2013-03-31 NOTE — ED Notes (Signed)
Pt tearful, states she was told she has pancreatitis because she drinks, Pt state she only drinks on a rare occasion. Abdominal pain and vomiting.

## 2013-03-31 NOTE — Progress Notes (Signed)
ED/CM noted patient did not have health insurance and/or PCP listed in the computer.  Patient was given the [place] resource handout with information on the clinics, food pantries, and the handout for new health insurance sign-up.  Patient expressed appreciation for this. 

## 2013-04-01 ENCOUNTER — Emergency Department: Payer: Self-pay | Admitting: Emergency Medicine

## 2013-04-01 LAB — COMPREHENSIVE METABOLIC PANEL
Albumin: 4.7 g/dL (ref 3.4–5.0)
Anion Gap: 8 (ref 7–16)
Bilirubin,Total: 1 mg/dL (ref 0.2–1.0)
Calcium, Total: 9.7 mg/dL (ref 8.5–10.1)
Co2: 26 mmol/L (ref 21–32)
Creatinine: 0.61 mg/dL (ref 0.60–1.30)
EGFR (Non-African Amer.): >60
Glucose: 136 mg/dL — ABNORMAL HIGH (ref 65–99)
Osmolality: 280 (ref 275–301)
Potassium: 3.5 mmol/L (ref 3.5–5.1)
SGOT(AST): 28 U/L (ref 15–37)
Sodium: 138 mmol/L (ref 136–145)
Total Protein: 7.9 g/dL (ref 6.4–8.2)

## 2013-04-01 LAB — CBC
HCT: 47.6 % — ABNORMAL HIGH (ref 35.0–47.0)
MCHC: 35 g/dL (ref 32.0–36.0)
MCV: 93 fL (ref 80–100)
WBC: 15.5 10*3/uL — ABNORMAL HIGH (ref 3.6–11.0)

## 2013-04-01 LAB — URINALYSIS, COMPLETE
Blood: NEGATIVE
Glucose,UR: NEGATIVE mg/dL (ref 0–75)
Leukocyte Esterase: NEGATIVE
Protein: NEGATIVE
RBC,UR: 2 /HPF (ref 0–5)
Specific Gravity: 1.036 (ref 1.003–1.030)
WBC UR: <1 /HPF (ref 0–5)

## 2013-04-01 LAB — ETHANOL
Ethanol %: <0.003 % (ref 0.000–0.080)
Ethanol: <3 mg/dL

## 2013-04-01 LAB — DRUG SCREEN, URINE
Benzodiazepine, Ur Scrn: POSITIVE (ref ?–200)
MDMA (Ecstasy)Ur Screen: NEGATIVE (ref ?–500)
Methadone, Ur Screen: NEGATIVE (ref ?–300)
Opiate, Ur Screen: NEGATIVE (ref ?–300)
Phencyclidine (PCP) Ur S: NEGATIVE (ref ?–25)

## 2013-04-01 LAB — TSH: Thyroid Stimulating Horm: 0.6 u[IU]/mL

## 2013-04-01 LAB — LIPASE, BLOOD: Lipase: 393 U/L (ref 73–393)

## 2013-04-01 LAB — SALICYLATE LEVEL: Salicylates, Serum: <1.7 mg/dL

## 2013-04-01 LAB — CK TOTAL AND CKMB (NOT AT ARMC)
CK, Total: 45 U/L (ref 21–215)
CK-MB: <0.5 ng/mL — ABNORMAL LOW (ref 0.5–3.6)

## 2013-04-01 LAB — TROPONIN I: Troponin-I: <0.02 ng/mL

## 2013-04-01 MED ORDER — PROMETHAZINE HCL 25 MG/ML IJ SOLN
12.5000 mg | Freq: Once | INTRAMUSCULAR | Status: AC
  Administered 2013-04-01: 12.5 mg via INTRAVENOUS
  Filled 2013-04-01: qty 1

## 2013-04-01 MED ORDER — PROMETHAZINE HCL 25 MG RE SUPP: 25.0000 mg | Freq: Four times a day (QID) | RECTAL | Status: DC | PRN

## 2013-04-01 MED ORDER — SODIUM CHLORIDE 0.9 % IV BOLUS (SEPSIS)
1000.0000 mL | Freq: Once | INTRAVENOUS | Status: AC
  Administered 2013-04-01: 1000 mL via INTRAVENOUS

## 2013-04-01 MED ORDER — MORPHINE SULFATE 4 MG/ML IJ SOLN
4.0000 mg | Freq: Once | INTRAMUSCULAR | Status: AC
  Administered 2013-04-01: 4 mg via INTRAVENOUS
  Filled 2013-04-01: qty 1

## 2013-04-01 NOTE — ED Provider Notes (Signed)
CSN: [id number]     Arrival date & time 03/31/13  2327 History     First MD Initiated Contact with Patient 03/31/13 2359     Chief Complaint  Patient presents with  . Abdominal Pain  . Emesis   (Consider location/radiation/quality/duration/timing/severity/associated sxs/prior Treatment) HPI Comments: [name] is a 47 y.o. Female here for her second visit today (was discharged 7 hours ago) for control of her nausea, vomiting and epigastric pain found to be due to a new onset pancreatitis as her lipase was 115 earlier today, but [place] was negative for gallbadder disease.  She was hydrated here and sent home with prescriptions of norco and zofran.  She states she is unable to get her medicines filled until tomorrow and she continues to have nausea and vomiting.  She did take a borrowed phenergan this evening,however, and promptly vomited this and the fluids she tried to drink prior to arrival tonight.  She also has continued abdominal pain, which is milder then her prior visit, the vomiting being her biggest complaint at this time.  She denies fevers but does feel chilled.  She denies diarrhea, shortness of breath, chest pain and dysuria although has had decreased urine since yesterday. She was given 2 liters of NS at her visit here  Today. She reports rare use of etoh and does not have diabetes.     Patient is a 47 y.o. female presenting with vomiting. The history is provided by the patient.  Emesis Associated symptoms: abdominal pain and chills   Associated symptoms: no arthralgias, no diarrhea, no headaches and no sore throat     Past Medical History  Diagnosis Date  . Chronic headaches   . Hernia   . Hypertension   . Sinusitis   . Bell palsy   . Pancreatitis, acute    Past Surgical History  Procedure Laterality Date  . Cesarean section    . Abdominal hysterectomy     Family History  Problem Relation Age of Onset  . Cancer Father   . Diabetes Other    History  Substance  Use Topics  . Smoking status: Current Every Day Smoker -- 1.00 packs/day for 20 years    Types: Cigarettes  . Smokeless tobacco: Never Used  . Alcohol Use: No   OB History   Grav Para Term Preterm Abortions TAB SAB Ect Mult Living   2 2 2       2      Review of Systems  Constitutional: Positive for chills. Negative for fever.  HENT: Negative for congestion, sore throat and neck pain.   Eyes: Negative.   Respiratory: Negative for chest tightness and shortness of breath.   Cardiovascular: Negative for chest pain.  Gastrointestinal: Positive for nausea, vomiting and abdominal pain. Negative for diarrhea, constipation and abdominal distention.  Genitourinary: Negative.   Musculoskeletal: Negative for joint swelling and arthralgias.  Skin: Negative.  Negative for rash and wound.  Neurological: Negative for dizziness, weakness, light-headedness, numbness and headaches.  Psychiatric/Behavioral: Negative.     Allergies  Review of patient's allergies indicates no known allergies.  Home Medications   Current Outpatient Rx  Name  Route  Sig  Dispense  Refill  . aspirin-acetaminophen-caffeine (EXCEDRIN MIGRAINE) 250-250-65 MG per tablet   Oral   Take 1 tablet by mouth every 6 (six) hours as needed for pain.         Marland Kitchen Phenyleph-Doxylamine-DM-APAP (ALKA-SELTZER PLS ALLERGY & [facility] PO)   Oral   Take 2 capsules  by mouth every 6 (six) hours as needed (allergies/cough).         . ranitidine (ZANTAC) 150 MG tablet   Oral   Take 150 mg by mouth 2 (two) times daily.         . fluticasone (FLONASE) 50 MCG/ACT nasal spray   Nasal   Place 2 sprays into the nose daily.   16 g   0   . HYDROcodone-acetaminophen (NORCO) 5-325 MG per tablet   Oral   Take 1 tablet by mouth every 4 (four) hours as needed for pain.   20 tablet   0   . ondansetron (ZOFRAN) 8 MG tablet   Oral   Take 1 tablet (8 mg total) by mouth every 8 (eight) hours as needed for nausea.   20 tablet   0   .  promethazine (PHENERGAN) 25 MG suppository   Rectal   Place 1 suppository (25 mg total) rectally every 6 (six) hours as needed for nausea.   20 each   0    BP 165/96  Pulse 77  Temp(Src) 99 F (37.2 C) (Oral)  SpO2 94% Physical Exam  Nursing note and vitals reviewed. Constitutional: She appears well-developed and well-nourished.  HENT:  Head: Normocephalic and atraumatic.  Mouth/Throat: Oropharynx is clear and moist.  Eyes: Conjunctivae are normal.  Neck: Normal range of motion.  Cardiovascular: Normal rate, regular rhythm, normal heart sounds and intact distal pulses.   Pulmonary/Chest: Effort normal and breath sounds normal. She has no wheezes.  Abdominal: Soft. Bowel sounds are normal. There is no hepatosplenomegaly. There is tenderness in the epigastric area. There is no rebound and no guarding.  Musculoskeletal: Normal range of motion.  Neurological: She is alert.  Skin: Skin is warm and dry.  Psychiatric: She has a normal mood and affect.    ED Course   Procedures (including critical care time)  Labs Reviewed - No data to display Dg Chest 2 View  03/31/2013   *RADIOLOGY REPORT*  Clinical Data: Pain and fever  CHEST - 2 VIEW  Comparison:  March 13, 2012  Findings:  Lungs clear.  Heart size and pulmonary vascularity are normal.  No adenopathy.  No bone lesions.  IMPRESSION: No abnormality noted.   Original Report Authenticated By: Bretta Bang, M.D.   Ct Head Wo Contrast  03/31/2013   *RADIOLOGY REPORT*  Clinical Data: Headache, nausea and right sided facial droop. Bell's palsy.  CT HEAD WITHOUT CONTRAST  Technique:  Contiguous axial images were obtained from the base of the skull through the vertex without contrast.  Comparison: None.  Findings: No evidence of acute infarct, acute hemorrhage, mass lesion, mass effect or hydrocephalus.  Visualized portions of the paranasal sinuses show scattered mucosal thickening.  No air fluid levels.  Trace fluid in the right mastoid  air cells.  IMPRESSION:  1.  No acute intracranial abnormality. 2.  Minimal scattered mucosal thickening in the ethmoid air cells. 3.  Trace right mastoid effusion.   Original Report Authenticated By: Leanna Battles, M.D.   US Abdomen Complete  03/31/2013   *RADIOLOGY REPORT*  Clinical Data:  Abdominal pain  COMPLETE ABDOMINAL ULTRASOUND  Comparison:  None.  Findings:  Gallbladder:  No gallstones, gallbladder wall thickening, or pericholecystic fluid.  Common bile duct:  3 mm  Liver:  No focal lesion identified.  Within normal limits in parenchymal echogenicity.  IVC:  Appears normal.  Pancreas:  Not well visualized due to overlying bowel gas.  Spleen:  8.3 cm.  No mass lesion is noted.  Right Kidney:  10.6 cm.  No mass lesion or hydronephrosis is seen.  Left Kidney:  10.8 cm.  No mass lesion or hydronephrosis is noted.  Abdominal aorta:  No aneurysm identified.  A 2.1 cm hypoechoic region is noted superior to the right kidney which corresponds to a known right adrenal mass.  IMPRESSION: Stable right adrenal mass.  No other focal abnormality is seen.   Original Report Authenticated By: Alcide Clever, M.D.   1. Pancreatitis     MDM  Pt was given NS IV,  Phenergan 12.5 mg IV,  Morphine 4 mg IV with resolution of pain and nausea.  She was able to tolerate PO's prior to dc home.  She was prescribed phenergan suppositories (as she states phenergan helps better than zofran for her).  Encouraged clear liquids x 1-2 days,  Slowly increase to regular diet as tolerated.  Return here for worsened sx.  Labs from previous visit today reviewed.  Not repeated as prior labs less than 12 hours old.  Burgess Amor, PA-C 04/01/13 [phone]

## 2013-04-01 NOTE — ED Provider Notes (Signed)
Medical screening examination/treatment/procedure(s) were performed by non-physician practitioner and as supervising physician I was immediately available for consultation/collaboration.   Pricsilla Lindvall, MD 04/01/13 0646 

## 2013-04-01 NOTE — ED Notes (Signed)
Pt vomiting and chilling at this time.

## 2013-04-01 NOTE — ED Notes (Signed)
Patient given discharge instruction, verbalized understand. IV removed, band aid applied. Patient ambulatory out of the department to waiting area.

## 2013-04-02 ENCOUNTER — Emergency Department: Payer: Self-pay | Admitting: Emergency Medicine

## 2013-04-02 LAB — CBC
HGB: 15.9 g/dL (ref 12.0–16.0)
MCH: 32.7 pg (ref 26.0–34.0)
MCV: 93 fL (ref 80–100)
Platelet: 213 10*3/uL (ref 150–440)
RDW: 13 % (ref 11.5–14.5)
WBC: 13.5 10*3/uL — ABNORMAL HIGH (ref 3.6–11.0)

## 2013-04-02 LAB — COMPREHENSIVE METABOLIC PANEL
Alkaline Phosphatase: 66 U/L (ref 50–136)
Anion Gap: 7 (ref 7–16)
BUN: 20 mg/dL — ABNORMAL HIGH (ref 7–18)
Calcium, Total: 9.4 mg/dL (ref 8.5–10.1)
Chloride: 103 mmol/L (ref 98–107)
Co2: 27 mmol/L (ref 21–32)
Creatinine: 0.69 mg/dL (ref 0.60–1.30)
EGFR (African American): >60
EGFR (Non-African Amer.): >60
Glucose: 120 mg/dL — ABNORMAL HIGH (ref 65–99)
Osmolality: 278 (ref 275–301)
Potassium: 3.3 mmol/L — ABNORMAL LOW (ref 3.5–5.1)
SGOT(AST): 20 U/L (ref 15–37)
SGPT (ALT): 32 U/L (ref 12–78)
Sodium: 137 mmol/L (ref 136–145)
Total Protein: 7.4 g/dL (ref 6.4–8.2)

## 2013-04-02 LAB — LIPASE, BLOOD: Lipase: 63 U/L — ABNORMAL LOW (ref 73–393)

## 2013-04-03 LAB — TROPONIN I: Troponin-I: <0.02 ng/mL

## 2013-04-03 LAB — URINALYSIS, COMPLETE
Bacteria: NONE SEEN
Bilirubin,UR: NEGATIVE
Blood: NEGATIVE
Glucose,UR: 50 mg/dL (ref 0–75)
Leukocyte Esterase: NEGATIVE
Nitrite: NEGATIVE
Ph: 6 (ref 4.5–8.0)
Protein: 30
Specific Gravity: 1.02 (ref 1.003–1.030)
WBC UR: 1 /HPF (ref 0–5)

## 2013-04-03 LAB — CK TOTAL AND CKMB (NOT AT ARMC): CK, Total: 32 U/L (ref 21–215)

## 2013-08-27 ENCOUNTER — Emergency Department (HOSPITAL_COMMUNITY)
Admission: EM | Admit: 2013-08-27 | Discharge: 2013-08-27 | Disposition: A | Discharge status: Home / Self Care | Payer: Self-pay | Attending: Emergency Medicine | Admitting: Emergency Medicine

## 2013-08-27 ENCOUNTER — Encounter (HOSPITAL_COMMUNITY): Payer: Self-pay | Admitting: Emergency Medicine

## 2013-08-27 DIAGNOSIS — Z8719 Personal history of other diseases of the digestive system: Secondary | ICD-10-CM | POA: Insufficient documentation

## 2013-08-27 DIAGNOSIS — Z8709 Personal history of other diseases of the respiratory system: Secondary | ICD-10-CM | POA: Insufficient documentation

## 2013-08-27 DIAGNOSIS — R51 Headache: Secondary | ICD-10-CM | POA: Insufficient documentation

## 2013-08-27 DIAGNOSIS — Z8669 Personal history of other diseases of the nervous system and sense organs: Secondary | ICD-10-CM | POA: Insufficient documentation

## 2013-08-27 DIAGNOSIS — R630 Anorexia: Secondary | ICD-10-CM | POA: Insufficient documentation

## 2013-08-27 DIAGNOSIS — Z79899 Other long term (current) drug therapy: Secondary | ICD-10-CM | POA: Insufficient documentation

## 2013-08-27 DIAGNOSIS — K5289 Other specified noninfective gastroenteritis and colitis: Secondary | ICD-10-CM | POA: Insufficient documentation

## 2013-08-27 DIAGNOSIS — F17200 Nicotine dependence, unspecified, uncomplicated: Secondary | ICD-10-CM | POA: Insufficient documentation

## 2013-08-27 DIAGNOSIS — K529 Noninfective gastroenteritis and colitis, unspecified: Secondary | ICD-10-CM

## 2013-08-27 DIAGNOSIS — J029 Acute pharyngitis, unspecified: Secondary | ICD-10-CM | POA: Insufficient documentation

## 2013-08-27 DIAGNOSIS — I10 Essential (primary) hypertension: Secondary | ICD-10-CM | POA: Insufficient documentation

## 2013-08-27 DIAGNOSIS — IMO0002 Reserved for concepts with insufficient information to code with codable children: Secondary | ICD-10-CM | POA: Insufficient documentation

## 2013-08-27 LAB — COMPREHENSIVE METABOLIC PANEL
ALK PHOS: 57 U/L (ref 39–117)
ALT: 12 U/L (ref 0–35)
AST: 11 U/L (ref 0–37)
Albumin: 4.6 g/dL (ref 3.5–5.2)
BUN: 39 mg/dL — AB (ref 6–23)
CALCIUM: 9.4 mg/dL (ref 8.4–10.5)
CO2: 26 meq/L (ref 19–32)
Chloride: 95 mEq/L — ABNORMAL LOW (ref 96–112)
Creatinine, Ser: 0.72 mg/dL (ref 0.50–1.10)
GLUCOSE: 129 mg/dL — AB (ref 70–99)
POTASSIUM: 3.3 meq/L — AB (ref 3.7–5.3)
SODIUM: 139 meq/L (ref 137–147)
TOTAL PROTEIN: 7.4 g/dL (ref 6.0–8.3)
Total Bilirubin: 1 mg/dL (ref 0.3–1.2)

## 2013-08-27 LAB — CBC WITH DIFFERENTIAL/PLATELET
Basophils Absolute: 0 10*3/uL (ref 0.0–0.1)
Basophils Relative: 0 % (ref 0–1)
EOS ABS: 0 10*3/uL (ref 0.0–0.7)
EOS PCT: 0 % (ref 0–5)
HCT: 49.5 % — ABNORMAL HIGH (ref 36.0–46.0)
HEMOGLOBIN: 17.2 g/dL — AB (ref 12.0–15.0)
LYMPHS ABS: 1.4 10*3/uL (ref 0.7–4.0)
LYMPHS PCT: 12 % (ref 12–46)
MCH: 32.5 pg (ref 26.0–34.0)
MCHC: 34.7 g/dL (ref 30.0–36.0)
MCV: 93.4 fL (ref 78.0–100.0)
MONOS PCT: 4 % (ref 3–12)
Monocytes Absolute: 0.4 10*3/uL (ref 0.1–1.0)
Neutro Abs: 10.2 10*3/uL — ABNORMAL HIGH (ref 1.7–7.7)
Neutrophils Relative %: 84 % — ABNORMAL HIGH (ref 43–77)
PLATELETS: 212 10*3/uL (ref 150–400)
RBC: 5.3 MIL/uL — AB (ref 3.87–5.11)
RDW: 12.1 % (ref 11.5–15.5)
WBC: 12.1 10*3/uL — AB (ref 4.0–10.5)

## 2013-08-27 LAB — URINALYSIS, ROUTINE W REFLEX MICROSCOPIC
Glucose, UA: NEGATIVE mg/dL
HGB URINE DIPSTICK: NEGATIVE
Ketones, ur: >80 mg/dL — AB
Leukocytes, UA: NEGATIVE
NITRITE: NEGATIVE
PROTEIN: 30 mg/dL — AB
Specific Gravity, Urine: 1.02 (ref 1.005–1.030)
UROBILINOGEN UA: 1 mg/dL (ref 0.0–1.0)
pH: 7 (ref 5.0–8.0)

## 2013-08-27 LAB — URINE MICROSCOPIC-ADD ON

## 2013-08-27 LAB — LIPASE, BLOOD: LIPASE: 29 U/L (ref 11–59)

## 2013-08-27 MED ORDER — ONDANSETRON HCL 4 MG PO TABS: 4.0000 mg | ORAL_TABLET | Freq: Four times a day (QID) | ORAL | Status: DC

## 2013-08-27 MED ORDER — ONDANSETRON HCL 4 MG/2ML IJ SOLN
4.0000 mg | Freq: Once | INTRAMUSCULAR | Status: DC
  Filled 2013-08-27: qty 2

## 2013-08-27 MED ORDER — GI COCKTAIL ~~LOC~~
30.0000 mL | Freq: Once | ORAL | Status: AC
  Administered 2013-08-27: 30 mL via ORAL
  Filled 2013-08-27: qty 30

## 2013-08-27 MED ORDER — SODIUM CHLORIDE 0.9 % IV SOLN
1000.0000 mL | Freq: Once | INTRAVENOUS | Status: AC
Start: 2013-08-27 — End: 2013-08-27
  Administered 2013-08-27: 1000 mL via INTRAVENOUS

## 2013-08-27 MED ORDER — DIPHENHYDRAMINE HCL 50 MG/ML IJ SOLN
25.0000 mg | Freq: Once | INTRAMUSCULAR | Status: AC
  Administered 2013-08-27: 25 mg via INTRAVENOUS
  Filled 2013-08-27: qty 1

## 2013-08-27 MED ORDER — DEXAMETHASONE SODIUM PHOSPHATE 4 MG/ML IJ SOLN
8.0000 mg | Freq: Once | INTRAMUSCULAR | Status: AC
  Administered 2013-08-27: 8 mg via INTRAVENOUS
  Filled 2013-08-27: qty 2

## 2013-08-27 MED ORDER — SODIUM CHLORIDE 0.9 % IV SOLN
1000.0000 mL | INTRAVENOUS | Status: DC
  Administered 2013-08-27: 1000 mL via INTRAVENOUS

## 2013-08-27 MED ORDER — METOCLOPRAMIDE HCL 5 MG/ML IJ SOLN
10.0000 mg | Freq: Once | INTRAMUSCULAR | Status: AC
  Administered 2013-08-27: 10 mg via INTRAVENOUS
  Filled 2013-08-27: qty 2

## 2013-08-27 MED ORDER — ONDANSETRON HCL 4 MG/2ML IJ SOLN
4.0000 mg | Freq: Once | INTRAMUSCULAR | Status: AC
  Administered 2013-08-27: 4 mg via INTRAVENOUS

## 2013-08-27 NOTE — Progress Notes (Signed)
  ED/CM noted patient did not have health insurance and/or PCP listed in the computer.  Patient was given the [facility] with information on the clinics, food pantries, and the handout for new health insurance sign-up.  Also, provided [facility] card.  Patient expressed appreciation for information received.

## 2013-08-27 NOTE — ED Notes (Signed)
Patient states symptoms of nausea, vomiting, diarrhea, and sore throat started Wednesday. She stated that the diarrhea was present "at the beginning," but has subsided. She continues to have the Nausea, vomiting, and sore throat. Patient has a history of Pancreatitis and stomach ulcers. Patient states starting yesterday, the vomit changed to a dark brown color. She denies pain. Patient states she is unable to keep anything down. Patient is also complaining of muscle "grabbing" in her upper left thigh and will tear fully cringe in pain until the muscle lets loose. Patient states, "I had this happen in my lower legs this morning in the shower." Patient is comfortable and resting in the bed. Patient is in no acute distress.

## 2013-08-27 NOTE — ED Provider Notes (Signed)
CSN: [id number]     Arrival date & time 08/27/13  0831 History   First MD Initiated Contact with Patient 08/27/13 [phone]     Chief Complaint  Patient presents with  . Sore Throat  . Emesis   (Consider location/radiation/quality/duration/timing/severity/associated sxs/prior Treatment) HPI Comments: Patient is 48 year old female with a PMHx significant for pancreatitis, HTN and chronic headaches who presents to the ED with a 3-4 day history of nausea and vomiting.  She denies abdominal pain at this time but reports that she vomits about 6-7 times per day with NBNB vomitus.  She states she has vomited so much that her throat is now sore.  She states that she has also had nasal congestion and slight cough with this as well.  She states low grade fever but denies abdominal pain.  States that about 2 days ago the diarrhea started.  She has tried to take her phenergan suppositories but states the diarrhea has prevented this.  She has also tried OTC Peptobismal as well without relief of symptoms.  She states this does not feel like the last time she had pancreatitis.    Patient is a 48 y.o. female presenting with pharyngitis and vomiting. The history is provided by the patient. No language interpreter was used.  Sore Throat This is a new problem. The current episode started in the past 7 days. The problem occurs 2 to 4 times per day. The problem has been gradually worsening. Associated symptoms include anorexia, a change in bowel habit, congestion, coughing, a fever, nausea, a sore throat and vomiting. Pertinent negatives include no abdominal pain, chills, fatigue, joint swelling, myalgias, neck pain, swollen glands, urinary symptoms, visual change or weakness. The symptoms are aggravated by swallowing. She has tried nothing for the symptoms. The treatment provided no relief.  Emesis Associated symptoms: sore throat   Associated symptoms: no abdominal pain, no chills and no myalgias     Past Medical History   Diagnosis Date  . Chronic headaches   . Hernia   . Hypertension   . Sinusitis   . Bell palsy   . Pancreatitis, acute    Past Surgical History  Procedure Laterality Date  . Cesarean section    . Abdominal hysterectomy     Family History  Problem Relation Age of Onset  . Cancer Father   . Diabetes Other    History  Substance Use Topics  . Smoking status: Current Every Day Smoker -- 1.00 packs/day for 20 years    Types: Cigarettes  . Smokeless tobacco: Never Used  . Alcohol Use: No   OB History   Grav Para Term Preterm Abortions TAB SAB Ect Mult Living   2 2 2       2      Review of Systems  Constitutional: Positive for fever. Negative for chills and fatigue.  HENT: Positive for congestion and sore throat.   Respiratory: Positive for cough.   Gastrointestinal: Positive for nausea, vomiting, anorexia and change in bowel habit. Negative for abdominal pain.  Musculoskeletal: Negative for joint swelling, myalgias and neck pain.  Neurological: Negative for weakness.  All other systems reviewed and are negative.    Allergies  Review of patient's allergies indicates no known allergies.  Home Medications   Current Outpatient Rx  Name  Route  Sig  Dispense  Refill  . aspirin-acetaminophen-caffeine (EXCEDRIN MIGRAINE) 250-250-65 MG per tablet   Oral   Take 1 tablet by mouth every 6 (six) hours as needed for  pain.         . fluticasone (FLONASE) 50 MCG/ACT nasal spray   Nasal   Place 2 sprays into the nose daily.   16 g   0   . HYDROcodone-acetaminophen (NORCO) 5-325 MG per tablet   Oral   Take 1 tablet by mouth every 4 (four) hours as needed for pain.   20 tablet   0   . ondansetron (ZOFRAN) 8 MG tablet   Oral   Take 1 tablet (8 mg total) by mouth every 8 (eight) hours as needed for nausea.   20 tablet   0   . Phenyleph-Doxylamine-DM-APAP (ALKA-SELTZER PLS ALLERGY & [facility] PO)   Oral   Take 2 capsules by mouth every 6 (six) hours as needed  (allergies/cough).         . promethazine (PHENERGAN) 25 MG suppository   Rectal   Place 1 suppository (25 mg total) rectally every 6 (six) hours as needed for nausea.   20 each   0   . ranitidine (ZANTAC) 150 MG tablet   Oral   Take 150 mg by mouth 2 (two) times daily.          BP 147/104  Pulse 124  Temp(Src) 99 F (37.2 C) (Oral)  Resp 18  Ht 5\' 6"  (1.676 m)  Wt 147 lb (66.679 kg)  BMI 23.74 kg/m2  SpO2 95% Physical Exam  Nursing note and vitals reviewed. Constitutional: She is oriented to person, place, and time. She appears well-developed and well-nourished. No distress.  HENT:  Head: Normocephalic and atraumatic.  Right Ear: External ear normal.  Left Ear: External ear normal.  Nose: Nose normal.  Mouth/Throat: No oropharyngeal exudate.  Mild posterior pharyngeal erythema without exudate, mucous membranes dry  Eyes: Conjunctivae are normal. Pupils are equal, round, and reactive to light. No scleral icterus.  Neck: Normal range of motion. Neck supple.  Cardiovascular: Regular rhythm and normal heart sounds.  Exam reveals no gallop and no friction rub.   No murmur heard. tachycardia  Pulmonary/Chest: Effort normal and breath sounds normal. No respiratory distress. She has no wheezes. She has no rales. She exhibits no tenderness.  Abdominal: Soft. Bowel sounds are normal. She exhibits no distension and no mass. There is no tenderness. There is no rebound and no guarding.  Musculoskeletal: Normal range of motion. She exhibits no edema and no tenderness.  Lymphadenopathy:    She has no cervical adenopathy.  Neurological: She is alert and oriented to person, place, and time. She exhibits normal muscle tone. Coordination normal.  Skin: Skin is warm and dry. No rash noted. No erythema. No pallor.  Psychiatric: She has a normal mood and affect. Her behavior is normal. Judgment and thought content normal.    ED Course  Procedures (including critical care time) Labs  Review Labs Reviewed  CBC WITH DIFFERENTIAL  COMPREHENSIVE METABOLIC PANEL  LIPASE, BLOOD  URINALYSIS, ROUTINE W REFLEX MICROSCOPIC   Imaging Review No results found.  EKG Interpretation   None      Results for orders placed during the hospital encounter of 08/27/13  CBC WITH DIFFERENTIAL      Result Value Range   WBC 12.1 (*) 4.0 - 10.5 K/uL   RBC 5.30 (*) 3.87 - 5.11 MIL/uL   Hemoglobin 17.2 (*) 12.0 - 15.0 g/dL   HCT 49.5 (*) 36.0 - 46.0 %   MCV 93.4  78.0 - 100.0 fL   MCH 32.5  26.0 - 34.0 pg   MCHC  34.7  30.0 - 36.0 g/dL   RDW 12.1  11.5 - 15.5 %   Platelets 212  150 - 400 K/uL   Neutrophils Relative % 84 (*) 43 - 77 %   Neutro Abs 10.2 (*) 1.7 - 7.7 K/uL   Lymphocytes Relative 12  12 - 46 %   Lymphs Abs 1.4  0.7 - 4.0 K/uL   Monocytes Relative 4  3 - 12 %   Monocytes Absolute 0.4  0.1 - 1.0 K/uL   Eosinophils Relative 0  0 - 5 %   Eosinophils Absolute 0.0  0.0 - 0.7 K/uL   Basophils Relative 0  0 - 1 %   Basophils Absolute 0.0  0.0 - 0.1 K/uL  COMPREHENSIVE METABOLIC PANEL      Result Value Range   Sodium 139  137 - 147 mEq/L   Potassium 3.3 (*) 3.7 - 5.3 mEq/L   Chloride 95 (*) 96 - 112 mEq/L   CO2 26  19 - 32 mEq/L   Glucose, Bld 129 (*) 70 - 99 mg/dL   BUN 39 (*) 6 - 23 mg/dL   Creatinine, Ser 0.72  0.50 - 1.10 mg/dL   Calcium 9.4  8.4 - 10.5 mg/dL   Total Protein 7.4  6.0 - 8.3 g/dL   Albumin 4.6  3.5 - 5.2 g/dL   AST 11  0 - 37 U/L   ALT 12  0 - 35 U/L   Alkaline Phosphatase 57  39 - 117 U/L   Total Bilirubin 1.0  0.3 - 1.2 mg/dL   GFR calc non Af Amer >90  >90 mL/min   GFR calc Af Amer >90  >90 mL/min  LIPASE, BLOOD      Result Value Range   Lipase 29  11 - 59 U/L  URINALYSIS, ROUTINE W REFLEX MICROSCOPIC      Result Value Range   Color, Urine YELLOW  YELLOW   APPearance CLEAR  CLEAR   Specific Gravity, Urine 1.020  1.005 - 1.030   pH 7.0  5.0 - 8.0   Glucose, UA NEGATIVE  NEGATIVE mg/dL   Hgb urine dipstick NEGATIVE  NEGATIVE   Bilirubin  Urine SMALL (*) NEGATIVE   Ketones, ur >80 (*) NEGATIVE mg/dL   Protein, ur 30 (*) NEGATIVE mg/dL   Urobilinogen, UA 1.0  0.0 - 1.0 mg/dL   Nitrite NEGATIVE  NEGATIVE   Leukocytes, UA NEGATIVE  NEGATIVE  URINE MICROSCOPIC-ADD ON      Result Value Range   Squamous Epithelial / LPF FEW (*) RARE   WBC, UA 0-2  <3 WBC/hpf   RBC / HPF 0-2  <3 RBC/hpf   Bacteria, UA RARE  RARE   Urine-Other MUCOUS PRESENT     No results found.  Medications  0.9 %  sodium chloride infusion (1,000 mLs Intravenous New Bag/Given 08/27/13 0904)    Followed by  0.9 %  sodium chloride infusion (0 mLs Intravenous Stopped 08/27/13 1011)  gi cocktail (Maalox,Lidocaine,Donnatal) (30 mLs Oral Given 08/27/13 0912)  ondansetron (ZOFRAN) injection 4 mg (4 mg Intravenous Given 08/27/13 0912)  metoCLOPramide (REGLAN) injection 10 mg (10 mg Intravenous Given 08/27/13 1031)  diphenhydrAMINE (BENADRYL) injection 25 mg (25 mg Intravenous Given 08/27/13 1031)  dexamethasone (DECADRON) injection 8 mg (8 mg Intravenous Given 08/27/13 1031)    MDM  Gastroenteritis  Patient here with nausea, vomiting and diarrhea, labs are at baseline, no evidence to suggest pancreatitis, able to keep down po fluids here.  Will discharge home  with zofran ODT tablets.   Idalia Needle Joelyn Oms, PA-C 08/27/13 1149

## 2013-08-27 NOTE — Discharge Instructions (Signed)
Viral Gastroenteritis Viral gastroenteritis is also known as stomach flu. This condition affects the stomach and intestinal tract. It can cause sudden diarrhea and vomiting. The illness typically lasts 3 to 8 days. Most people develop an immune response that eventually gets rid of the virus. While this natural response develops, the virus can make you quite ill. CAUSES  Many different viruses can cause gastroenteritis, such as rotavirus or noroviruses. You can catch one of these viruses by consuming contaminated food or water. You may also catch a virus by sharing utensils or other personal items with an infected person or by touching a contaminated surface. SYMPTOMS  The most common symptoms are diarrhea and vomiting. These problems can cause a severe loss of body fluids (dehydration) and a body salt (electrolyte) imbalance. Other symptoms may include:  Fever.  Headache.  Fatigue.  Abdominal pain. DIAGNOSIS  Your caregiver can usually diagnose viral gastroenteritis based on your symptoms and a physical exam. A stool sample may also be taken to test for the presence of viruses or other infections. TREATMENT  This illness typically goes away on its own. Treatments are aimed at rehydration. The most serious cases of viral gastroenteritis involve vomiting so severely that you are not able to keep fluids down. In these cases, fluids must be given through an intravenous line (IV). HOME CARE INSTRUCTIONS   Drink enough fluids to keep your urine clear or pale yellow. Drink small amounts of fluids frequently and increase the amounts as tolerated.  Ask your caregiver for specific rehydration instructions.  Avoid:  Foods high in sugar.  Alcohol.  Carbonated drinks.  Tobacco.  Juice.  Caffeine drinks.  Extremely hot or cold fluids.  Fatty, greasy foods.  Too much intake of anything at one time.  Dairy products until 24 to 48 hours after diarrhea stops.  You may consume probiotics.  Probiotics are active cultures of beneficial bacteria. They may lessen the amount and number of diarrheal stools in adults. Probiotics can be found in yogurt with active cultures and in supplements.  Wash your hands well to avoid spreading the virus.  Only take over-the-counter or prescription medicines for pain, discomfort, or fever as directed by your caregiver. Do not give aspirin to children. Antidiarrheal medicines are not recommended.  Ask your caregiver if you should continue to take your regular prescribed and over-the-counter medicines.  Keep all follow-up appointments as directed by your caregiver. SEEK IMMEDIATE MEDICAL CARE IF:   You are unable to keep fluids down.  You do not urinate at least once every 6 to 8 hours.  You develop shortness of breath.  You notice blood in your stool or vomit. This may look like coffee grounds.  You have abdominal pain that increases or is concentrated in one small area (localized).  You have persistent vomiting or diarrhea.  You have a fever.  The patient is a child younger than 3 months, and he or she has a fever.  The patient is a child older than 3 months, and he or she has a fever and persistent symptoms.  The patient is a child older than 3 months, and he or she has a fever and symptoms suddenly get worse.  The patient is a baby, and he or she has no tears when crying. MAKE SURE YOU:   Understand these instructions.  Will watch your condition.  Will get help right away if you are not doing well or get worse. Document Released: 08/04/2005 Document Revised: 10/27/2011 Document Reviewed: 05/21/2011   [employer] Patient Information 2014 [employer]  

## 2013-08-27 NOTE — ED Provider Notes (Signed)
Medical screening examination/treatment/procedure(s) were performed by non-physician practitioner and as supervising physician I was immediately available for consultation/collaboration.  EKG Interpretation   None         Maudry Diego, MD 08/27/13 [phone]

## 2013-08-27 NOTE — ED Notes (Signed)
Pt c/o sore throat, vomiting, and diarrhea x 3 days.  Unknown if has had fever.

## 2014-06-19 ENCOUNTER — Encounter (HOSPITAL_COMMUNITY): Payer: Self-pay | Admitting: Emergency Medicine

## 2014-09-18 ENCOUNTER — Emergency Department: Payer: Self-pay | Admitting: Student

## 2014-09-18 LAB — CBC
HCT: 54.3 % — AB (ref 35.0–47.0)
HGB: 18.2 g/dL — AB (ref 12.0–16.0)
MCH: 31.5 pg (ref 26.0–34.0)
MCHC: 33.6 g/dL (ref 32.0–36.0)
MCV: 94 fL (ref 80–100)
Platelet: 246 10*3/uL (ref 150–440)
RBC: 5.78 10*6/uL — ABNORMAL HIGH (ref 3.80–5.20)
RDW: 12.6 % (ref 11.5–14.5)
WBC: 9.9 10*3/uL (ref 3.6–11.0)

## 2014-09-18 LAB — URINALYSIS, COMPLETE
BILIRUBIN, UR: NEGATIVE
Bacteria: NONE SEEN
Blood: NEGATIVE
Glucose,UR: 50 mg/dL (ref 0–75)
Hyaline Cast: 1
LEUKOCYTE ESTERASE: NEGATIVE
Nitrite: NEGATIVE
Ph: 6 (ref 4.5–8.0)
Protein: 100
RBC,UR: 3 /HPF (ref 0–5)
Specific Gravity: 1.026 (ref 1.003–1.030)
WBC UR: 1 /HPF (ref 0–5)

## 2014-09-18 LAB — COMPREHENSIVE METABOLIC PANEL
ALK PHOS: 70 U/L (ref 46–116)
Albumin: 5.3 g/dL — ABNORMAL HIGH (ref 3.4–5.0)
Anion Gap: 11 (ref 7–16)
BUN: 31 mg/dL — ABNORMAL HIGH (ref 7–18)
Bilirubin,Total: 0.9 mg/dL (ref 0.2–1.0)
CO2: 27 mmol/L (ref 21–32)
Calcium, Total: 10.3 mg/dL — ABNORMAL HIGH (ref 8.5–10.1)
Chloride: 99 mmol/L (ref 98–107)
Creatinine: 0.95 mg/dL (ref 0.60–1.30)
Glucose: 143 mg/dL — ABNORMAL HIGH (ref 65–99)
OSMOLALITY: 283 (ref 275–301)
Potassium: 4.1 mmol/L (ref 3.5–5.1)
SGOT(AST): 27 U/L (ref 15–37)
SGPT (ALT): 18 U/L (ref 14–63)
Sodium: 137 mmol/L (ref 136–145)
Total Protein: 8.6 g/dL — ABNORMAL HIGH (ref 6.4–8.2)

## 2014-09-18 LAB — LIPASE, BLOOD: LIPASE: 50 U/L — AB (ref 73–393)

## 2014-09-18 LAB — D-DIMER(ARMC): D-Dimer: 227 ng/ml

## 2014-09-18 LAB — TROPONIN I

## 2015-03-14 ENCOUNTER — Encounter: Payer: Self-pay | Admitting: *Deleted

## 2015-03-14 ENCOUNTER — Emergency Department
Admission: EM | Admit: 2015-03-14 | Discharge: 2015-03-14 | Disposition: A | Discharge status: Home / Self Care | Payer: Self-pay | Attending: Emergency Medicine | Admitting: Emergency Medicine

## 2015-03-14 ENCOUNTER — Emergency Department: Payer: Self-pay

## 2015-03-14 DIAGNOSIS — Z79899 Other long term (current) drug therapy: Secondary | ICD-10-CM | POA: Insufficient documentation

## 2015-03-14 DIAGNOSIS — Y998 Other external cause status: Secondary | ICD-10-CM | POA: Insufficient documentation

## 2015-03-14 DIAGNOSIS — Y9289 Other specified places as the place of occurrence of the external cause: Secondary | ICD-10-CM | POA: Insufficient documentation

## 2015-03-14 DIAGNOSIS — S52501A Unspecified fracture of the lower end of right radius, initial encounter for closed fracture: Secondary | ICD-10-CM | POA: Insufficient documentation

## 2015-03-14 DIAGNOSIS — Z720 Tobacco use: Secondary | ICD-10-CM | POA: Insufficient documentation

## 2015-03-14 DIAGNOSIS — Z7982 Long term (current) use of aspirin: Secondary | ICD-10-CM | POA: Insufficient documentation

## 2015-03-14 DIAGNOSIS — I10 Essential (primary) hypertension: Secondary | ICD-10-CM | POA: Insufficient documentation

## 2015-03-14 DIAGNOSIS — S62101A Fracture of unspecified carpal bone, right wrist, initial encounter for closed fracture: Secondary | ICD-10-CM

## 2015-03-14 DIAGNOSIS — Y9389 Activity, other specified: Secondary | ICD-10-CM | POA: Insufficient documentation

## 2015-03-14 DIAGNOSIS — W1839XA Other fall on same level, initial encounter: Secondary | ICD-10-CM | POA: Insufficient documentation

## 2015-03-14 MED ORDER — OXYCODONE-ACETAMINOPHEN 5-325 MG PO TABS
ORAL_TABLET | ORAL | Status: AC
  Administered 2015-03-14: 2 via ORAL
  Filled 2015-03-14: qty 2

## 2015-03-14 MED ORDER — OXYCODONE-ACETAMINOPHEN 5-325 MG PO TABS
2.0000 | ORAL_TABLET | Freq: Once | ORAL | Status: AC
  Administered 2015-03-14: 2 via ORAL

## 2015-03-14 MED ORDER — OXYCODONE-ACETAMINOPHEN 7.5-325 MG PO TABS: 1.0000 | ORAL_TABLET | ORAL | Status: DC | PRN

## 2015-03-14 MED ORDER — IBUPROFEN 800 MG PO TABS: 800.0000 mg | ORAL_TABLET | Freq: Three times a day (TID) | ORAL | Status: DC | PRN

## 2015-03-14 NOTE — ED Provider Notes (Signed)
[facility] Emergency Department Provider Note  ____________________________________________  Time seen: Approximately 4:17 PM  I have reviewed the triage vital signs and the nursing notes.   HISTORY  Chief Complaint Wrist Pain    HPI [name] is a 49 y.o. female patient complaining of right wrist pain for 1-1/2 weeks secondary to a fall. Patient states she [facility] and was told she had of wrist fracture.. She is placed in a splint and oriented until today. Patient states she removed the splint because it was causing her arm to swell up and was painful. Patient states he ran out of pain medicine 4 days ago. Patient has appointment orthopedics on 03/26/2015.Patient rates the pain as a 7/10 and describes the sharp.   Past Medical History  Diagnosis Date  . Chronic headaches   . Hernia   . Hypertension   . Sinusitis   . Bell palsy   . Pancreatitis, acute     There are no active problems to display for this patient.   Past Surgical History  Procedure Laterality Date  . Cesarean section    . Abdominal hysterectomy      Current Outpatient Rx  Name  Route  Sig  Dispense  Refill  . aspirin-acetaminophen-caffeine (EXCEDRIN MIGRAINE) 250-250-65 MG per tablet   Oral   Take 1 tablet by mouth every 6 (six) hours as needed for pain.         Marland Kitchen ibuprofen (ADVIL,MOTRIN) 800 MG tablet   Oral   Take 1 tablet (800 mg total) by mouth every 8 (eight) hours as needed for moderate pain.   15 tablet   0   . ondansetron (ZOFRAN) 4 MG tablet   Oral   Take 1 tablet (4 mg total) by mouth every 6 (six) hours.   12 tablet   0   . oxyCODONE-acetaminophen (PERCOCET) 7.5-325 MG per tablet   Oral   Take 1 tablet by mouth every 4 (four) hours as needed for severe pain.   20 tablet   0   . oxymetazoline (AFRIN) 0.05 % nasal spray   Each Nare   Place 1 spray into both nostrils 2 (two) times daily as needed for congestion.         Marland Kitchen  Phenyleph-Doxylamine-DM-APAP (ALKA-SELTZER PLS ALLERGY & [facility] PO)   Oral   Take 2 capsules by mouth every 6 (six) hours as needed (allergies/cough).         . promethazine (PHENERGAN) 25 MG suppository   Rectal   Place 1 suppository (25 mg total) rectally every 6 (six) hours as needed for nausea.   20 each   0   . ranitidine (ZANTAC) 150 MG tablet   Oral   Take 150 mg by mouth 2 (two) times daily as needed for heartburn.          . tetrahydrozoline (VISINE) 0.05 % ophthalmic solution   Both Eyes   Place 2 drops into both eyes daily as needed (allergies).           Allergies Review of patient's allergies indicates no known allergies.  Family History  Problem Relation Age of Onset  . Cancer Father   . Diabetes Other     Social History History  Substance Use Topics  . Smoking status: Current Every Day Smoker -- 1.00 packs/day for 20 years    Types: Cigarettes  . Smokeless tobacco: Never Used  . Alcohol Use: No    Review  of Systems Constitutional: No fever/chills Eyes: No visual changes. ENT: No sore throat. Cardiovascular: Denies chest pain. Respiratory: Denies shortness of breath. Gastrointestinal: No abdominal pain.  No nausea, no vomiting.  No diarrhea.  No constipation. Genitourinary: Negative for dysuria. Musculoskeletal: Right wrist pain and edema  Skin: Negative for rash. Neurological: Negative for headaches, focal weakness or numbness. {10-point ROS otherwise negative.  ____________________________________________   PHYSICAL EXAM:  VITAL SIGNS: ED Triage Vitals  Enc Vitals Group     BP --      Pulse --      Resp --      Temp --      Temp src --      SpO2 --      Weight --      Height --      Head Cir --      Peak Flow --      Pain Score 03/14/15 1606 7     Pain Loc --      Pain Edu? --      Excl. in [place]? --     Constitutional: Alert and oriented. Well appearing and in no acute distress. Eyes: Conjunctivae are normal. PERRL.  EOMI. Head: Atraumatic. Nose: No congestion/rhinnorhea. Mouth/Throat: Mucous membranes are moist.  Oropharynx non-erythematous. Neck: No stridor. No cervical spine tenderness to palpation. Hematological/Lymphatic/Immunilogical: No cervical lymphadenopathy. Cardiovascular: Normal rate, regular rhythm. Grossly normal heart sounds.  Good peripheral circulation. Respiratory: Normal respiratory effort.  No retractions. Lungs CTAB. Gastrointestinal: Soft and nontender. No distention. No abdominal bruits. No CVA tenderness. Musculoskeletal: No obvious deformity of the right wrist. There is skin indentation secondary to the splint probably be too tight. Patient neurovascularly intact decreased range of motion secondary to complain of pain.  Neurologic:  Normal speech and language. No gross focal neurologic deficits are appreciated. No gait instability. Skin:  Skin is warm, dry and intact. No rash noted. Psychiatric: Mood and affect are normal. Speech and behavior are normal.  ____________________________________________   LABS (all labs ordered are listed, but only abnormal results are displayed)  Labs Reviewed - No data to display ____________________________________________  EKG   ____________________________________________  RADIOLOGY comminuted fracture of the distal radius.  I, Sable Feil, personally viewed and evaluated these images as part of my medical decision making.   ____________________________________________   PROCEDURES  Procedure(s) performed: None  Critical Care performed: No  ____________________________________________   INITIAL IMPRESSION / ASSESSMENT AND PLAN / ED COURSE  Pertinent labs & imaging results that were available during my care of the patient were reviewed by me and considered in my medical decision making (see chart for details).  Distal right radial fracture. Patient will be placed in the splint and sling. Patient given prescription for  Percocets and ibuprofen. Patient advised follow-up with orthopedic clinic in the next 2 days. ____________________________________________   FINAL CLINICAL IMPRESSION(S) / ED DIAGNOSES  Final diagnoses:  Right wrist fracture, closed, initial encounter      Sable Feil, PA-C 03/14/15 1713  Sharlon Roca, MD 03/15/15 [phone]

## 2015-03-14 NOTE — Discharge Instructions (Signed)
Wear splint and sling untilevaluation by Ortho Dcotor.

## 2015-03-14 NOTE — ED Notes (Signed)
Pt has pain  And bruising of fright forearm/wrist.  Pt fell 1 week ago and fx wrist.  Pt states splint was too tight.  No splint on arm now.  Pt has a sling with her.

## 2015-03-14 NOTE — ED Notes (Signed)
Pt states that she fell while on vacation about 1.5 weeks ago and injured her right wrist. Pt was seen at hospital in [place] and told she had a wrist fracture. Pt had a splint on her arm, but took it off because her arm was swelling and warm. She c/o pain in the right arm. No meds prior to arrival (prescription ran out last week)

## 2015-05-09 ENCOUNTER — Emergency Department
Admission: EM | Admit: 2015-05-09 | Discharge: 2015-05-10 | Disposition: A | Discharge status: Other Acute Inpatient Hospital | Payer: Medicaid Other | Attending: Internal Medicine | Admitting: Internal Medicine

## 2015-05-09 ENCOUNTER — Encounter: Payer: Self-pay | Admitting: Emergency Medicine

## 2015-05-09 DIAGNOSIS — I10 Essential (primary) hypertension: Secondary | ICD-10-CM | POA: Insufficient documentation

## 2015-05-09 DIAGNOSIS — F1210 Cannabis abuse, uncomplicated: Secondary | ICD-10-CM | POA: Insufficient documentation

## 2015-05-09 DIAGNOSIS — F332 Major depressive disorder, recurrent severe without psychotic features: Secondary | ICD-10-CM

## 2015-05-09 DIAGNOSIS — F323 Major depressive disorder, single episode, severe with psychotic features: Secondary | ICD-10-CM

## 2015-05-09 DIAGNOSIS — F4321 Adjustment disorder with depressed mood: Secondary | ICD-10-CM

## 2015-05-09 DIAGNOSIS — F419 Anxiety disorder, unspecified: Secondary | ICD-10-CM | POA: Insufficient documentation

## 2015-05-09 DIAGNOSIS — F329 Major depressive disorder, single episode, unspecified: Secondary | ICD-10-CM | POA: Insufficient documentation

## 2015-05-09 DIAGNOSIS — Z720 Tobacco use: Secondary | ICD-10-CM | POA: Insufficient documentation

## 2015-05-09 DIAGNOSIS — Z634 Disappearance and death of family member: Secondary | ICD-10-CM

## 2015-05-09 DIAGNOSIS — F4329 Adjustment disorder with other symptoms: Secondary | ICD-10-CM

## 2015-05-09 DIAGNOSIS — F32A Depression, unspecified: Secondary | ICD-10-CM

## 2015-05-09 HISTORY — DX: Fracture of unspecified carpal bone, unspecified wrist, initial encounter for closed fracture: S62.109A

## 2015-05-09 LAB — COMPREHENSIVE METABOLIC PANEL
ALBUMIN: 4.4 g/dL (ref 3.5–5.0)
ALK PHOS: 50 U/L (ref 38–126)
ALT: 8 U/L — ABNORMAL LOW (ref 14–54)
ANION GAP: 8 (ref 5–15)
AST: 17 U/L (ref 15–41)
BILIRUBIN TOTAL: 1 mg/dL (ref 0.3–1.2)
BUN: 22 mg/dL — ABNORMAL HIGH (ref 6–20)
CO2: 24 mmol/L (ref 22–32)
Calcium: 9.3 mg/dL (ref 8.9–10.3)
Chloride: 110 mmol/L (ref 101–111)
Creatinine, Ser: 0.65 mg/dL (ref 0.44–1.00)
GFR calc non Af Amer: >60 mL/min (ref >60)
GLUCOSE: 110 mg/dL — AB (ref 65–99)
POTASSIUM: 4.3 mmol/L (ref 3.5–5.1)
Sodium: 142 mmol/L (ref 135–145)
TOTAL PROTEIN: 6.6 g/dL (ref 6.5–8.1)

## 2015-05-09 LAB — URINE DRUG SCREEN, QUALITATIVE (ARMC ONLY)
Amphetamines, Ur Screen: NOT DETECTED
BARBITURATES, UR SCREEN: NOT DETECTED
Benzodiazepine, Ur Scrn: NOT DETECTED
Cannabinoid 50 Ng, Ur ~~LOC~~: POSITIVE — AB
Cocaine Metabolite,Ur ~~LOC~~: NOT DETECTED
MDMA (Ecstasy)Ur Screen: NOT DETECTED
METHADONE SCREEN, URINE: NOT DETECTED
OPIATE, UR SCREEN: NOT DETECTED
Phencyclidine (PCP) Ur S: NOT DETECTED
Tricyclic, Ur Screen: NOT DETECTED

## 2015-05-09 LAB — ACETAMINOPHEN LEVEL

## 2015-05-09 LAB — CBC
HEMATOCRIT: 44 % (ref 35.0–47.0)
Hemoglobin: 14.8 g/dL (ref 12.0–16.0)
MCH: 31.5 pg (ref 26.0–34.0)
MCHC: 33.6 g/dL (ref 32.0–36.0)
MCV: 93.7 fL (ref 80.0–100.0)
PLATELETS: 232 10*3/uL (ref 150–440)
RBC: 4.7 MIL/uL (ref 3.80–5.20)
RDW: 13.1 % (ref 11.5–14.5)
WBC: 8.9 10*3/uL (ref 3.6–11.0)

## 2015-05-09 LAB — SALICYLATE LEVEL: Salicylate Lvl: <4 mg/dL (ref 2.8–30.0)

## 2015-05-09 LAB — ETHANOL: Alcohol, Ethyl (B): <5 mg/dL (ref <5)

## 2015-05-09 MED ORDER — ACETAMINOPHEN 325 MG PO TABS
650.0000 mg | ORAL_TABLET | Freq: Four times a day (QID) | ORAL | Status: DC | PRN
  Administered 2015-05-10: 650 mg via ORAL
  Filled 2015-05-09: qty 2

## 2015-05-09 MED ORDER — ACETAMINOPHEN 325 MG PO TABS
ORAL_TABLET | ORAL | Status: AC
  Administered 2015-05-09: 325 mg
  Filled 2015-05-09: qty 2

## 2015-05-09 MED ORDER — CITALOPRAM HYDROBROMIDE 20 MG PO TABS
10.0000 mg | ORAL_TABLET | Freq: Every day | ORAL | Status: DC
Start: 2015-05-09 — End: 2015-05-10
  Administered 2015-05-09: 10 mg via ORAL
  Filled 2015-05-09: qty 1

## 2015-05-09 MED ORDER — DIAZEPAM 5 MG PO TABS
5.0000 mg | ORAL_TABLET | Freq: Once | ORAL | Status: AC
  Administered 2015-05-09: 5 mg via ORAL
  Filled 2015-05-09: qty 1

## 2015-05-09 NOTE — ED Provider Notes (Signed)
[facility] Emergency Department Provider Note     Time seen: ----------------------------------------- 2:50 PM on 05/09/2015 -----------------------------------------    I have reviewed the triage vital signs and the nursing notes.   HISTORY  Chief Complaint Depression; Anxiety; and Suicidal    HPI [name] is a 49 y.o. female the presents ER stating "she feels like she is about to lose it". Patient reports depression and anxiety with intermittent suicidal ideation. She does not occur and possibly to hurt herself or anyone else, she did take medication several months ago and overdose attempt. Reports her son committed suicide earlier this year, she presents very tearful, also reports she was kicked out of her homerecently.   Past Medical History  Diagnosis Date  . Chronic headaches   . Hernia   . Hypertension   . Sinusitis   . Bell palsy   . Pancreatitis, acute   . Wrist fracture     There are no active problems to display for this patient.   Past Surgical History  Procedure Laterality Date  . Cesarean section    . Abdominal hysterectomy      Allergies Review of patient's allergies indicates no known allergies.  Social History Social History  Substance Use Topics  . Smoking status: Current Every Day Smoker -- 1.00 packs/day for 20 years    Types: Cigarettes  . Smokeless tobacco: Never Used  . Alcohol Use: No    Review of Systems Constitutional: Negative for fever. Eyes: Negative for visual changes. ENT: Negative for sore throat. Cardiovascular: Negative for chest pain. Respiratory: Negative for shortness of breath. Gastrointestinal: Negative for abdominal pain, vomiting and diarrhea. Genitourinary: Negative for dysuria. Musculoskeletal: Negative for back pain. Skin: Negative for rash. Neurological: Negative for headaches, focal weakness or numbness. Psychiatric: positive for anxiety depression and occasional suicidal  thoughts ____________________________________________   PHYSICAL EXAM:  VITAL SIGNS: ED Triage Vitals  Enc Vitals Group     BP 05/09/15 1404 158/115 mmHg     Pulse Rate 05/09/15 1404 95     Resp 05/09/15 1404 18     Temp 05/09/15 1404 98.1 F (36.7 C)     Temp Source 05/09/15 1404 Oral     SpO2 05/09/15 1404 96 %     Weight 05/09/15 1404 120 lb (54.432 kg)     Height 05/09/15 1404 5\' 6"  (1.676 m)     Head Cir --      Peak Flow --      Pain Score 05/09/15 1405 7     Pain Loc --      Pain Edu? --      Excl. in [place]? --     Constitutional: Alert and oriented. Well appearing and in no distress. Eyes: Conjunctivae are normal. PERRL. Normal extraocular movements. ENT   Head: Normocephalic and atraumatic.   Nose: No congestion/rhinnorhea.   Mouth/Throat: Mucous membranes are moist.   Neck: No stridor. Cardiovascular: Normal rate, regular rhythm. Normal and symmetric distal pulses are present in all extremities. No murmurs, rubs, or gallops. Respiratory: Normal respiratory effort without tachypnea nor retractions. Breath sounds are clear and equal bilaterally. No wheezes/rales/rhonchi. Gastrointestinal: Soft and nontender. No distention. No abdominal bruits.  Musculoskeletal: Nontender with normal range of motion in all extremities. No joint effusions.  No lower extremity tenderness nor edema. Neurologic:  Normal speech and language. No gross focal neurologic deficits are appreciated. Speech is normal. No gait instability. Skin:  Skin is warm, dry and intact. No rash noted.  Psychiatric: depressed mood and affect, tearful. Denies suicidal or homicidal ideation currently.  ____________________________________________  ED COURSE:  Pertinent labs & imaging results that were available during my care of the patient were reviewed by me and considered in my medical decision making (see chart for details). Patient appears for depressed, borderline suicidal. She'll need  psychiatric consultation. ____________________________________________    LABS (pertinent positives/negatives)  Labs Reviewed  CBC  COMPREHENSIVE METABOLIC PANEL  ETHANOL  SALICYLATE LEVEL  ACETAMINOPHEN LEVEL  URINE DRUG SCREEN, QUALITATIVE ([place])   ____________________________________________  FINAL ASSESSMENT AND PLAN  Depression, suicidal thoughts  Plan: Patient with labs and imaging as dictated above. Patient is very depressed. She will need to see the psychiatrist, we will keep her voluntary at this point. She received by mouth Valium for anxiety.   Earleen Newport, MD   Earleen Newport, MD 05/09/15 [phone]

## 2015-05-09 NOTE — ED Notes (Signed)
ED BHU [place] Is the patient under IVC or is there intent for IVC: No. Is the patient medically cleared: Yes.   Is there vacancy in the ED BHU: Yes.   Is the population mix appropriate for patient: Yes.   Is the patient awaiting placement in inpatient or outpatient setting: Yes.   Has the patient had a psychiatric consult: Yes.   Survey of unit performed for contraband, proper placement and condition of furniture, tampering with fixtures in bathroom, shower, and each patient room: Yes.  ; Findings:  APPEARANCE/BEHAVIOR Cooperative but tearful NEURO ASSESSMENT Orientation: time, place and person Hallucinations: No.None noted (Hallucinations) Speech: Normal Gait: normal RESPIRATORY ASSESSMENT Normal expansion.  Clear to auscultation.  No rales, rhonchi, or wheezing. CARDIOVASCULAR ASSESSMENT regular rate and rhythm, S1, S2 normal, no murmur, click, rub or gallop GASTROINTESTINAL ASSESSMENT soft, nontender, BS WNL, no r/g EXTREMITIES normal strength, tone, and muscle mass to all except left arm due to recent fracture will need surgery in 3 weeks. Pt doesn't wear cockup splint. PLAN OF CARE Provide calm/safe environment. Vital signs assessed twice daily. ED BHU Assessment once each 12-hour shift. Collaborate with intake RN daily or as condition indicates. Assure the ED provider has rounded once each shift. Provide and encourage hygiene. Provide redirection as needed. Assess for escalating behavior; address immediately and inform ED provider.

## 2015-05-09 NOTE — ED Notes (Signed)

## 2015-05-09 NOTE — ED Notes (Signed)
BEHAVIORAL HEALTH ROUNDING Patient sleeping: No. Patient alert and oriented: yes Behavior appropriate: Yes.  ; If no, describe:  Nutrition and fluids offered: Yes  Toileting and hygiene offered: Yes  Sitter present: yes Law enforcement present: Yes  
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
## 2015-05-09 NOTE — ED Notes (Signed)
BEHAVIORAL HEALTH ROUNDING Patient sleeping: No. Patient alert and oriented: yes Behavior appropriate: Yes.  ; If no, describe:  Nutrition and fluids offered: No Toileting and hygiene offered: Yes  Sitter present: no Law enforcement present: Yes  

## 2015-05-09 NOTE — [facility] Counselor (Signed)
Pt provided with community resources to low cost and sliding scale clinics and dentists per request.
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
## 2015-05-09 NOTE — ED Notes (Signed)
Pt. To BHU from ED ambulatory without difficulty, to room BHU 7 . Report from Amy RN. Pt. Is alert and oriented, warm and dry in no distress. Pt. Currently denies SI, HI,. Pt. Tearful but cooperative. Pt. Made aware of security cameras and Q15 minute rounds. Pt. Encouraged to let Nursing staff know of any concerns or needs.  Pt states has a HA requesting something for it.

## 2015-05-09 NOTE — ED Notes (Signed)
Resumed care from greg rn.  Pt crying   Dr Jimmye Norman in with pt.  meds given.  Pt states i just feel depressed.

## 2015-05-09 NOTE — ED Notes (Signed)
BEHAVIORAL HEALTH ROUNDING Patient sleeping: No. Patient alert and oriented: yes Behavior appropriate: Yes.  ; If no, describe: tearful but in bed resting Nutrition and fluids offered: Yes Toileting and hygiene offered: Yes  Sitter present: yes q15 min observations and security camera monitoring Law enforcement present: Yes ODS [place]xon

## 2015-05-10 ENCOUNTER — Inpatient Hospital Stay
Admit: 2015-05-10 | Discharge: 2015-05-13 | DRG: 885 | Disposition: A | Discharge status: Other Acute Inpatient Hospital | Payer: No Typology Code available for payment source | Attending: Psychiatry | Admitting: Psychiatry

## 2015-05-10 DIAGNOSIS — F333 Major depressive disorder, recurrent, severe with psychotic symptoms: Principal | ICD-10-CM | POA: Diagnosis present

## 2015-05-10 DIAGNOSIS — F332 Major depressive disorder, recurrent severe without psychotic features: Secondary | ICD-10-CM | POA: Diagnosis present

## 2015-05-10 DIAGNOSIS — Z809 Family history of malignant neoplasm, unspecified: Secondary | ICD-10-CM

## 2015-05-10 DIAGNOSIS — G4700 Insomnia, unspecified: Secondary | ICD-10-CM | POA: Diagnosis present

## 2015-05-10 DIAGNOSIS — Z90710 Acquired absence of both cervix and uterus: Secondary | ICD-10-CM | POA: Diagnosis not present

## 2015-05-10 DIAGNOSIS — Z79899 Other long term (current) drug therapy: Secondary | ICD-10-CM | POA: Diagnosis not present

## 2015-05-10 DIAGNOSIS — G43909 Migraine, unspecified, not intractable, without status migrainosus: Secondary | ICD-10-CM | POA: Diagnosis present

## 2015-05-10 DIAGNOSIS — Z818 Family history of other mental and behavioral disorders: Secondary | ICD-10-CM | POA: Diagnosis not present

## 2015-05-10 DIAGNOSIS — F1220 Cannabis dependence, uncomplicated: Secondary | ICD-10-CM | POA: Diagnosis present

## 2015-05-10 DIAGNOSIS — F17200 Nicotine dependence, unspecified, uncomplicated: Secondary | ICD-10-CM | POA: Diagnosis present

## 2015-05-10 DIAGNOSIS — Z7982 Long term (current) use of aspirin: Secondary | ICD-10-CM

## 2015-05-10 DIAGNOSIS — F419 Anxiety disorder, unspecified: Secondary | ICD-10-CM | POA: Diagnosis present

## 2015-05-10 DIAGNOSIS — I10 Essential (primary) hypertension: Secondary | ICD-10-CM | POA: Diagnosis present

## 2015-05-10 DIAGNOSIS — R45851 Suicidal ideations: Secondary | ICD-10-CM | POA: Diagnosis present

## 2015-05-10 DIAGNOSIS — Z833 Family history of diabetes mellitus: Secondary | ICD-10-CM | POA: Diagnosis not present

## 2015-05-10 DIAGNOSIS — F17210 Nicotine dependence, cigarettes, uncomplicated: Secondary | ICD-10-CM | POA: Diagnosis present

## 2015-05-10 LAB — TSH: TSH: 0.602 u[IU]/mL (ref 0.350–4.500)

## 2015-05-10 MED ORDER — CITALOPRAM HYDROBROMIDE 20 MG PO TABS
10.0000 mg | ORAL_TABLET | Freq: Every day | ORAL | Status: DC
Start: 2015-05-10 — End: 2015-05-12
  Administered 2015-05-10 – 2015-05-11 (×2): 10 mg via ORAL
  Administered 2015-05-12: 10:00:00 via ORAL
  Filled 2015-05-10 (×3): qty 1

## 2015-05-10 MED ORDER — MAGNESIUM HYDROXIDE 400 MG/5ML PO SUSP
30.0000 mL | Freq: Every day | ORAL | Status: DC | PRN
Start: 2015-05-10 — End: 2015-05-13

## 2015-05-10 MED ORDER — INFLUENZA VAC SPLIT QUAD 0.5 ML IM SUSY
0.5000 mL | PREFILLED_SYRINGE | INTRAMUSCULAR | Status: AC
  Administered 2015-05-11: 0.5 mL via INTRAMUSCULAR
  Filled 2015-05-10: qty 0.5

## 2015-05-10 MED ORDER — HYDROCHLOROTHIAZIDE 25 MG PO TABS
25.0000 mg | ORAL_TABLET | Freq: Every day | ORAL | Status: DC
Start: 2015-05-10 — End: 2015-05-13
  Administered 2015-05-10 – 2015-05-13 (×4): 25 mg via ORAL
  Filled 2015-05-10 (×4): qty 1

## 2015-05-10 MED ORDER — ALUM & MAG HYDROXIDE-SIMETH 200-200-20 MG/5ML PO SUSP
30.0000 mL | ORAL | Status: DC | PRN
Start: 2015-05-10 — End: 2015-05-13

## 2015-05-10 MED ORDER — PNEUMOCOCCAL VAC POLYVALENT 25 MCG/0.5ML IJ INJ
0.5000 mL | INJECTION | INTRAMUSCULAR | Status: AC
Start: 2015-05-11 — End: 2015-05-11
  Administered 2015-05-11: 0.5 mL via INTRAMUSCULAR
  Filled 2015-05-10: qty 0.5

## 2015-05-10 MED ORDER — TOPIRAMATE 25 MG PO TABS
50.0000 mg | ORAL_TABLET | Freq: Every day | ORAL | Status: DC
  Administered 2015-05-10 – 2015-05-12 (×3): 50 mg via ORAL
  Filled 2015-05-10 (×4): qty 2

## 2015-05-10 MED ORDER — NICOTINE 21 MG/24HR TD PT24
21.0000 mg | MEDICATED_PATCH | Freq: Every day | TRANSDERMAL | Status: DC
  Administered 2015-05-10 – 2015-05-12 (×3): 21 mg via TRANSDERMAL
  Filled 2015-05-10 (×3): qty 1

## 2015-05-10 MED ORDER — SUMATRIPTAN SUCCINATE 50 MG PO TABS
50.0000 mg | ORAL_TABLET | ORAL | Status: DC | PRN
  Administered 2015-05-10 – 2015-05-13 (×6): 50 mg via ORAL
  Filled 2015-05-10 (×7): qty 1

## 2015-05-10 MED ORDER — TRAZODONE HCL 100 MG PO TABS
100.0000 mg | ORAL_TABLET | Freq: Every day | ORAL | Status: DC
  Administered 2015-05-10 – 2015-05-12 (×3): 100 mg via ORAL
  Filled 2015-05-10 (×4): qty 1
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
## 2015-05-10 NOTE — ED Notes (Signed)
BEHAVIORAL HEALTH ROUNDING Patient sleeping: Yes.   Patient alert and oriented: yes Behavior appropriate: Yes.  ; If no, describe: sleeping Nutrition and fluids offered: sleeping Toileting and hygiene offered: sleeping Sitter present: yes q15 min observations and security camera monitoring  Law enforcement present: Yes ODS Levada Dy

## 2015-05-10 NOTE — Progress Notes (Signed)
Patient admitted today voluntarily for suicidal ideation.  Patient's son committed suicide by hanging in January.  Patient's husband has left her and she is currently living with her mother.  Patient tearful on admission.  Patient oriented to unit. Patient search performed.  No contraband found.
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
## 2015-05-10 NOTE — Tx Team (Signed)
Initial Interdisciplinary Treatment Plan   PATIENT STRESSORS: Marital or family conflict   PATIENT STRENGTHS: Ability for insight Communication skills General fund of knowledge Motivation for treatment/growth   PROBLEM LIST: Problem List/Patient Goals Date to be addressed Date deferred Reason deferred Estimated date of resolution  Depression 05/10/15     Suicidal Ideation 05/10/15                                                DISCHARGE CRITERIA:  Improved stabilization in mood, thinking, and/or behavior  PRELIMINARY DISCHARGE PLAN: Outpatient therapy  PATIENT/FAMIILY INVOLVEMENT: This treatment plan has been presented to and reviewed with the patient, SADIE HAZELETT, and/or family member.  The patient and family have been given the opportunity to ask questions and make suggestions.  Nash Mantis [facility] 05/10/2015, 5:38 AM

## 2015-05-10 NOTE — Progress Notes (Signed)
Recreation Therapy Notes  Date: 09.22.16 Time: 3:00 pm Location: Craft Room  Group Topic: Leisure Education  Goal Area(s) Addresses:  Patient will identify activities for each letter of the alphabet. Patient will verbalize ability to integrate positive leisure into life post d/c. Patient will verbalize ability to use leisure as a Technical sales engineer.  Behavioral Response: Did not attend  Intervention: Leisure Alphabet  Activity: Patients were given a leisure alphabet worksheet and instructed to list healthy leisure activities for each letter of the alphabet.  Education: LRT educated patients on what they needed to participate in leisure activities.   Education Outcome: Patient did not attend group.   Clinical Observations/Feedback: Patient did not attend group.  Leonette Monarch, LRT/CTRS 05/10/2015 4:42 PM

## 2015-05-10 NOTE — Progress Notes (Signed)
Patient continues to endorses feelings of sadness and depression.  Denies SI at this time.  Complains of having a migraine, Dr. Bary Leriche notified and orders received.  Medicated x1 with topamax for mirgraine with good results.  Safety maintained and active listening provided.

## 2015-05-10 NOTE — ED Notes (Signed)
BEHAVIORAL HEALTH ROUNDING Patient sleeping: Yes.   Patient alert and oriented: yes Behavior appropriate: Yes.  ; If no, describe:pt is sleeping Nutrition and fluids offered: Sleeping Toileting and hygiene offered: Sleeping Sitter present: yes q15 min observations and security camera monitoring  Law enforcement present: Yes ODS Levada Dy
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
## 2015-05-10 NOTE — BHH Group Notes (Signed)
[facility] LCSW Group Therapy  05/10/2015 3:56 PM  Type of Therapy:  Group Therapy  Participation Level:  Did Not Attend   Keene Breath, MSW, LCSWA 05/10/2015, 3:56 PM

## 2015-05-11 NOTE — Progress Notes (Signed)
This 49 y.o. Female reports this shift that, she continues to feel depressed and sad; denies having any suicidal or homicidal thoughts; and c/o having, "a mild headache; and some right hand/arm tenderness; v/s; 99.0; 18; 70; 135/86; will continue to monitor client's status.

## 2015-05-11 NOTE — Progress Notes (Signed)
D) Patient pleasant and cooperative upon my assessment. Patient  completed Self Inventory Assessment and rates depression as  9/10, patient rates hopeless feelings as 10 /10.  Patient denies SI/HI, denies A/V hallucinations.  Patient's affect is flat and mood is depressed.  Reports that her appetite is good.   A) Patient offered support and encouragement, patient encouraged to discuss feelings/concerns with staff. Patient verbalized understanding. Patient monitored Q15 minutes for safety. Patient met with MD  to discuss today's goals and plan of care.  R) Patient visible in milieu, she did  attend groups today.  Patient come to dining room for meals and snacks. Patient appropriate with staff and peers.   Patient taking medications as ordered. Will continue to monitor.

## 2015-05-11 NOTE — Progress Notes (Signed)
[facility] MD Progress Note  05/11/2015 11:38 AM [name]  MRN:  [id number]  Subjective: [name] feels slightly better today but is still depressed, hopeless, worthless, crying. Her headache is still present but not as intense. She received Imitrex yesterday for migraine and was started on Topamax for headache prevention. She has not talked to her family since admission. She does participate in programming some but spends most of her day in the room with curtains closed.  Principal Problem: Major depressive disorder, recurrent, severe with psychotic features Diagnosis:   Patient Active Problem List   Diagnosis Date Noted  . Tobacco use disorder [Z72.0] 05/10/2015  . Cannabis use disorder, moderate, dependence [F12.20] 05/10/2015  . Major depressive disorder, recurrent, severe with psychotic features [F33.3] 05/09/2015  . Complicated grief [L38.10] 05/09/2015   Total Time spent with patient: 20 minutes   Past Medical History:  Past Medical History  Diagnosis Date  . Chronic headaches   . Hernia   . Hypertension   . Sinusitis   . Bell palsy   . Pancreatitis, acute   . Wrist fracture     Past Surgical History  Procedure Laterality Date  . Cesarean section    . Abdominal hysterectomy     Family History:  Family History  Problem Relation Age of Onset  . Cancer Father   . Diabetes Other    Social History:  History  Alcohol Use No     History  Drug Use  . Yes  . Special: Marijuana    Social History   Social History  . Marital Status: Legally Separated    Spouse Name: N/A  . Number of Children: N/A  . Years of Education: N/A   Social History Main Topics  . Smoking status: Current Every Day Smoker -- 1.00 packs/day for 20 years    Types: Cigarettes  . Smokeless tobacco: Never Used  . Alcohol Use: No  . Drug Use: Yes    Special: Marijuana  . Sexual Activity: No   Other Topics Concern  . None   Social History Narrative   Additional History:    Sleep:  Good  Appetite:  Fair   Assessment:   Musculoskeletal: Strength & Muscle Tone: within normal limits Gait & Station: normal Patient leans: N/A   Psychiatric Specialty Exam: Physical Exam  Nursing note and vitals reviewed.   Review of Systems  Neurological: Positive for headaches.  All other systems reviewed and are negative.   Blood pressure 135/90, pulse 118, temperature 98.6 F (37 C), temperature source Oral, resp. rate 20, height 5\' 6"  (1.676 m), weight 55.339 kg (122 lb), SpO2 98 %.Body mass index is 19.7 kg/(m^2).  General Appearance: Casual  Eye Contact::  Good  Speech:  Clear and Coherent  Volume:  Normal  Mood:  Depressed  Affect:  Tearful  Thought Process:  Goal Directed  Orientation:  Full (Time, Place, and Person)  Thought Content:  WDL  Suicidal Thoughts:  Yes.  with intent/plan  Homicidal Thoughts:  No  Memory:  Immediate;   Fair Recent;   Fair Remote;   Fair  Judgement:  Fair  Insight:  Fair  Psychomotor Activity:  Decreased  Concentration:  Fair  Recall:  [employer] of Knowledge:Fair  Language: Fair  Akathisia:  No  Handed:  Right  AIMS (if indicated):     Assets:  Communication Skills Desire for Improvement Housing Physical Health Resilience Social Support  ADL's:  Intact  Cognition: WNL  Sleep:  Number  of Hours: 7.5     Current Medications: Current Facility-Administered Medications  Medication Dose Route Frequency Provider Last Rate Last Dose  . alum & mag hydroxide-simeth (MAALOX/MYLANTA) 200-200-20 MG/5ML suspension 30 mL  30 mL Oral Q4H PRN Gonzella Lex, MD      . citalopram (CELEXA) tablet 10 mg  10 mg Oral Daily Gonzella Lex, MD   10 mg at 05/11/15 8264  . hydrochlorothiazide (HYDRODIURIL) tablet 25 mg  25 mg Oral Daily Clovis Fredrickson, MD   25 mg at 05/11/15 0939  . magnesium hydroxide (MILK OF MAGNESIA) suspension 30 mL  30 mL Oral Daily PRN Gonzella Lex, MD      . nicotine (NICODERM CQ - dosed in mg/24 hours) patch  21 mg  21 mg Transdermal Daily Jolanta B Pucilowska, MD   21 mg at 05/11/15 0957  . SUMAtriptan (IMITREX) tablet 50 mg  50 mg Oral Q2H PRN Clovis Fredrickson, MD   50 mg at 05/11/15 0801  . topiramate (TOPAMAX) tablet 50 mg  50 mg Oral QHS Clovis Fredrickson, MD   50 mg at 05/10/15 2150  . traZODone (DESYREL) tablet 100 mg  100 mg Oral QHS Clovis Fredrickson, MD   100 mg at 05/10/15 2150    Lab Results:  Results for orders placed or performed during the hospital encounter of 05/10/15 (from the past 48 hour(s))  TSH     Status: None   Collection Time: 05/09/15  2:14 PM  Result Value Ref Range   TSH 0.602 0.350 - 4.500 uIU/mL    Physical Findings: AIMS: Facial and Oral Movements Muscles of Facial Expression: None, normal Lips and Perioral Area: None, normal Jaw: None, normal Tongue: None, normal,Extremity Movements Upper (arms, wrists, hands, fingers): None, normal Lower (legs, knees, ankles, toes): None, normal, Trunk Movements Neck, shoulders, hips: None, normal, Overall Severity Severity of abnormal movements (highest score from questions above): None, normal Incapacitation due to abnormal movements: None, normal Patient's awareness of abnormal movements (rate only patient's report): No Awareness, Dental Status Current problems with teeth and/or dentures?: No Does patient usually wear dentures?: No  CIWA:    COWS:     Treatment Plan Summary: Daily contact with patient to assess and evaluate symptoms and progress in treatment and Medication management   Medical Decision Making:  Established Problem, Stable/Improving (1), Review of Psycho-Social Stressors (1), Review or order clinical lab tests (1), Review of Medication Regimen & Side Effects (2) and Review of New Medication or Change in Dosage (2)   [name] is a 49 year old female with a history of depression admitted for suicidal ideation in the context of major loss and severe social stressors.  1. Suicidal  ideation. The patient is able to contract for safety in the hospital.  2. Mood. She was started on Celexa in the emergency room.  3. Migraine headaches. We will offer Imitrex as needed and Topamax for headache prevention.  4. Hypertension. We started hydrochlorothiazide.   5. Smoking. Nicotine patch is available.  6. Insomnia. We'll offer trazodone.   7. Disposition. She will be discharged to home with her mother. She will follow up with [facility].     [name] 05/11/2015, 11:38 AM

## 2015-05-11 NOTE — BHH Group Notes (Signed)
[place] Group Notes:  (Nursing/MHT/Case Management/Adjunct)  Date:  05/11/2015  Time:  11:53 AM  Type of Therapy:  Psychoeducational Skills  Participation Level:  Minimal  Participation Quality:  Appropriate  Affect:  Appropriate  Cognitive:  Appropriate  Insight:  Appropriate  Engagement in Group:  None  Modes of Intervention:  Socialization  Summary of Progress/Problems:  Drake Leach 05/11/2015, 11:53 AM

## 2015-05-11 NOTE — Progress Notes (Signed)
Recreation Therapy Notes  Date: 09.23.16 Time: 3:00 pm Location: Craft Room  Group Topic: Problem solving, communication, teamwork  Goal Area(s) Addresses:  Patient will effectively work with peer towards shared goal. Patient will identify skills used to make activity successful. Patient will identify benefit of using group skills effectively post d/c.  Behavioral Response: Attentive, Interactive  Intervention: [employer]  Activity: Patients were divided up into two groups. Patients were instructed to build a free standing tower out of 15 pipe cleaners. After about 5 minutes of patients building, patients were instructed to put their dominant hand behind their back. After another 5 minutes, patients were instructed not to talk to each other.  Education: LRT educated patients on the importance of communication, problem solving, and teamwork   Education Outcome: In group clarification offered   Clinical Observations/Feedback: Patient worked with team to build tower. Patient used effective communication, problem solving, and teamwork skills. Patient contributed to group discussion by stating what skills he used during group.  Leonette Monarch, LRT/CTRS 05/11/2015 4:58 PM

## 2015-05-11 NOTE — Plan of Care (Signed)
Problem: [facility] Participation in Recreation Therapeutic Interventions Goal: STG-Patient will demonstrate improved self esteem by identif STG: Self-Esteem - Within 4 treatment sessions, patient will verbalize at least 5 positive affirmation statements in each of 2 treatment sessions to increase self-esteem post d/c.  Outcome: Progressing Treatment Session 1; Completed 1 out of 2: At approximately 12:35 pm, LRT met with patient in patient room. Patient verbalized 5 positive affirmation statements. Patient reported it felt "okay". LRT encouraged patient to continue saying positive affirmation statements.  Leonette Monarch, LRT/CTRS 09.23.16 5:22 pm Goal: STG-Other Recreation Therapy Goal (Specify) STG: Time Management - Within 4 treatment sessions, patient will verbalize understanding of scheduling in each of 2 treatment sessions to increase time management skills post d/c.  Outcome: Progressing Treatment Session 1; Completed 1 out of 2: At approximately 12:35 pm, LRT met with patient in patient room. LRT educated patient on scheduling and provided patient with blank schedules. Patient verbalized understanding.   Leonette Monarch, LRT/CTRS 09.23.16 5:23 pm

## 2015-05-12 MED ORDER — PROMETHAZINE HCL 25 MG PO TABS
25.0000 mg | ORAL_TABLET | Freq: Four times a day (QID) | ORAL | Status: DC | PRN
  Administered 2015-05-12 – 2015-05-13 (×3): 25 mg via ORAL
  Filled 2015-05-12 (×3): qty 1

## 2015-05-12 MED ORDER — MIRTAZAPINE 15 MG PO TABS
15.0000 mg | ORAL_TABLET | Freq: Every day | ORAL | Status: DC
  Administered 2015-05-12: 15 mg via ORAL
  Filled 2015-05-12 (×2): qty 1

## 2015-05-12 NOTE — Progress Notes (Signed)
D: Patient denies SI/HI/AVH. Patient affect is flat and her mood is sad and depressed.  Patient was nauseous after breakfast and vomited in her room.  Patient received an order for phenergan 25mg , with relief.  Patient attended  Group today . Patient interaction with staff and other patients is appropriate. Patient visible on the milieu. No distress noted. A: Support and encouragement offered. Scheduled medications given to pt. Q 15 min checks continued for patient safety. R: Patient receptive. Patient remains safe on the unit.

## 2015-05-12 NOTE — Progress Notes (Signed)
[facility] MD Progress Note  05/12/2015 2:06 PM CHARMAGNE BUHL  MRN:  [id number]  Subjective:  Ms. Shorb is still very depressed and extremely tearful. She hardly gets out of bed. She still experiences headaches most likely from crying. She tries to participate in programming with some success. She is extremely anxious about discharge as her mother did not call her or come to visit. She was sick in her stomach and nauseated today.  Principal Problem: Major depressive disorder, recurrent, severe with psychotic features Diagnosis:   Patient Active Problem List   Diagnosis Date Noted  . Tobacco use disorder [Z72.0] 05/10/2015  . Cannabis use disorder, moderate, dependence [F12.20] 05/10/2015  . Major depressive disorder, recurrent, severe with psychotic features [F33.3] 05/09/2015  . Complicated grief [E08.14] 05/09/2015   Total Time spent with patient: 20 minutes   Past Medical History:  Past Medical History  Diagnosis Date  . Chronic headaches   . Hernia   . Hypertension   . Sinusitis   . Bell palsy   . Pancreatitis, acute   . Wrist fracture     Past Surgical History  Procedure Laterality Date  . Cesarean section    . Abdominal hysterectomy     Family History:  Family History  Problem Relation Age of Onset  . Cancer Father   . Diabetes Other    Social History:  History  Alcohol Use No     History  Drug Use  . Yes  . Special: Marijuana    Social History   Social History  . Marital Status: Legally Separated    Spouse Name: N/A  . Number of Children: N/A  . Years of Education: N/A   Social History Main Topics  . Smoking status: Current Every Day Smoker -- 1.00 packs/day for 20 years    Types: Cigarettes  . Smokeless tobacco: Never Used  . Alcohol Use: No  . Drug Use: Yes    Special: Marijuana  . Sexual Activity: No   Other Topics Concern  . None   Social History Narrative   Additional History:    Sleep: Fair  Appetite:  Fair   Assessment:    Musculoskeletal: Strength & Muscle Tone: within normal limits Gait & Station: normal Patient leans: N/A   Psychiatric Specialty Exam: Physical Exam  Nursing note and vitals reviewed.   Review of Systems  Gastrointestinal: Positive for nausea and vomiting.  All other systems reviewed and are negative.   Blood pressure 136/99, pulse 139, temperature 98.3 F (36.8 C), temperature source Oral, resp. rate 20, height 5\' 6"  (1.676 m), weight 55.339 kg (122 lb), SpO2 98 %.Body mass index is 19.7 kg/(m^2).  General Appearance: Casual  Eye Contact::  Fair  Speech:  Clear and Coherent  Volume:  Decreased  Mood:  Anxious, Depressed and Hopeless  Affect:  Tearful  Thought Process:  Goal Directed  Orientation:  Full (Time, Place, and Person)  Thought Content:  WDL  Suicidal Thoughts:  No  Homicidal Thoughts:  No  Memory:  Immediate;   Fair Recent;   Fair Remote;   Fair  Judgement:  Fair  Insight:  Fair  Psychomotor Activity:  Decreased  Concentration:  Fair  Recall:  [employer] of Knowledge:Fair  Language: Fair  Akathisia:  No  Handed:  Right  AIMS (if indicated):     Assets:  Communication Skills Desire for Improvement Physical Health Resilience  ADL's:  Intact  Cognition: WNL  Sleep:  Number of Hours: 4.3  Current Medications: Current Facility-Administered Medications  Medication Dose Route Frequency Provider Last Rate Last Dose  . alum & mag hydroxide-simeth (MAALOX/MYLANTA) 200-200-20 MG/5ML suspension 30 mL  30 mL Oral Q4H PRN Gonzella Lex, MD      . hydrochlorothiazide (HYDRODIURIL) tablet 25 mg  25 mg Oral Daily Jolanta B Pucilowska, MD   25 mg at 05/12/15 1003  . magnesium hydroxide (MILK OF MAGNESIA) suspension 30 mL  30 mL Oral Daily PRN Gonzella Lex, MD      . mirtazapine (REMERON) tablet 15 mg  15 mg Oral QHS Jolanta B Pucilowska, MD      . nicotine (NICODERM CQ - dosed in mg/24 hours) patch 21 mg  21 mg Transdermal Daily Jolanta B Pucilowska, MD   21  mg at 05/12/15 1002  . promethazine (PHENERGAN) tablet 25 mg  25 mg Oral Q6H PRN Clovis Fredrickson, MD   25 mg at 05/12/15 1241  . SUMAtriptan (IMITREX) tablet 50 mg  50 mg Oral Q2H PRN Jolanta B Pucilowska, MD   50 mg at 05/12/15 1003  . topiramate (TOPAMAX) tablet 50 mg  50 mg Oral QHS Clovis Fredrickson, MD   50 mg at 05/11/15 2132  . traZODone (DESYREL) tablet 100 mg  100 mg Oral QHS Clovis Fredrickson, MD   100 mg at 05/11/15 2132    Lab Results: No results found for this or any previous visit (from the past 48 hour(s)).  Physical Findings: AIMS: Facial and Oral Movements Muscles of Facial Expression: None, normal Lips and Perioral Area: None, normal Jaw: None, normal Tongue: None, normal,Extremity Movements Upper (arms, wrists, hands, fingers): None, normal Lower (legs, knees, ankles, toes): None, normal, Trunk Movements Neck, shoulders, hips: None, normal, Overall Severity Severity of abnormal movements (highest score from questions above): None, normal Incapacitation due to abnormal movements: None, normal Patient's awareness of abnormal movements (rate only patient's report): No Awareness, Dental Status Current problems with teeth and/or dentures?: No Does patient usually wear dentures?: No  CIWA:    COWS:     Treatment Plan Summary: Daily contact with patient to assess and evaluate symptoms and progress in treatment and Medication management   Medical Decision Making:  Established Problem, Stable/Improving (1), Review of Psycho-Social Stressors (1), Review or order clinical lab tests (1), Review of Medication Regimen & Side Effects (2) and Review of New Medication or Change in Dosage (2)   Ms. Telford is a 49 year old female with a history of depression admitted for suicidal ideation in the context of major loss and severe social stressors.  1. Suicidal ideation. The patient is able to contract for safety in the hospital.  2. Mood. We discontinued Celexa and start  Remeron.  3. Migraine headaches. We will offer Imitrex as needed and Topamax for headache prevention.  4. Hypertension. We started hydrochlorothiazide.   5. Smoking. Nicotine patch is available.  6. Insomnia. We'll offer trazodone.   7. Nausea. Likely due to Celexa. Celexa was discontinued. She was offered family.  8. Disposition. She will be discharged to home with her mother. She will follow up with [facility].     [name] 05/12/2015, 2:06 PM

## 2015-05-12 NOTE — BHH Group Notes (Signed)
[facility] Group Notes:  (Nursing/MHT/Case Management/Adjunct)  Date:  05/12/2015  Time:  3:39 AM  Type of Therapy:  Group Therapy  Participation Level:  Active  Participation Quality:  Appropriate  Affect:  Appropriate  Cognitive:  Appropriate  Insight:  Good  Engagement in Group:  Engaged  Modes of Intervention:  n/a  Summary of Progress/Problems:  [name] 05/12/2015, 3:39 AM

## 2015-05-12 NOTE — BHH Group Notes (Signed)
[place] Group Notes:  (Nursing/MHT/Case Management/Adjunct)  Date:  05/12/2015  Time:  9:41 PM  Type of Therapy:  Evening Wrap-up Group  Participation Level:  Did Not Attend  Participation Quality:  Did Not Attend  Affect:  N/A  Cognitive:  N/A  Insight:  None  Engagement in Group:  Did Not Attend  Modes of Intervention:  Discussion  Summary of Progress/Problems:  [name] 05/12/2015, 9:41 PM

## 2015-05-12 NOTE — Progress Notes (Signed)
Client presents this shift with frequent c/o of upper left arm discomfort; having a mild headache; continues with having depression; and feeling sad; became tearfull when talking about her son; and with stating that; she does not have any supports; denies having any suicidal or homicidal thoughts; v/s= 98.6; 20; 118; 135/90.

## 2015-05-12 NOTE — BHH Group Notes (Signed)
[facility] LCSW Group Therapy  05/12/2015 3:56 PM  Type of Therapy:  Group Therapy  Participation Level:  Minimal  Participation Quality:  Attentive  Affect:  Depressed  Cognitive:  Alert  Insight:  Limited  Engagement in Therapy:  Limited  Modes of Intervention:  Discussion, Education, Socialization and Support  Summary of Progress/Problems: Balance in life: Patients will discuss the concept of balance and how it looks and feels to be unbalanced. Pt will identify areas in their life that is unbalanced and ways to become more balanced. Pt attended group and stayed the entire time. She sat quietly and listened to other group members.   [place] MSW, [place]  05/12/2015, 3:56 PM

## 2015-05-13 ENCOUNTER — Observation Stay: Payer: Medicaid Other

## 2015-05-13 ENCOUNTER — Observation Stay
Admission: RE | Admit: 2015-05-13 | Discharge: 2015-05-16 | Payer: Medicaid Other | Source: Ambulatory Visit | Attending: Internal Medicine | Admitting: Internal Medicine

## 2015-05-13 ENCOUNTER — Encounter: Payer: Self-pay | Admitting: Internal Medicine

## 2015-05-13 DIAGNOSIS — F17210 Nicotine dependence, cigarettes, uncomplicated: Secondary | ICD-10-CM | POA: Insufficient documentation

## 2015-05-13 DIAGNOSIS — R29810 Facial weakness: Secondary | ICD-10-CM | POA: Diagnosis present

## 2015-05-13 DIAGNOSIS — J329 Chronic sinusitis, unspecified: Secondary | ICD-10-CM | POA: Insufficient documentation

## 2015-05-13 DIAGNOSIS — R51 Headache: Secondary | ICD-10-CM | POA: Insufficient documentation

## 2015-05-13 DIAGNOSIS — E785 Hyperlipidemia, unspecified: Secondary | ICD-10-CM | POA: Insufficient documentation

## 2015-05-13 DIAGNOSIS — F329 Major depressive disorder, single episode, unspecified: Secondary | ICD-10-CM | POA: Insufficient documentation

## 2015-05-13 DIAGNOSIS — I639 Cerebral infarction, unspecified: Secondary | ICD-10-CM

## 2015-05-13 DIAGNOSIS — E876 Hypokalemia: Secondary | ICD-10-CM | POA: Insufficient documentation

## 2015-05-13 DIAGNOSIS — N179 Acute kidney failure, unspecified: Secondary | ICD-10-CM | POA: Insufficient documentation

## 2015-05-13 DIAGNOSIS — I160 Hypertensive urgency: Secondary | ICD-10-CM | POA: Diagnosis present

## 2015-05-13 DIAGNOSIS — R1013 Epigastric pain: Secondary | ICD-10-CM | POA: Insufficient documentation

## 2015-05-13 DIAGNOSIS — Z809 Family history of malignant neoplasm, unspecified: Secondary | ICD-10-CM | POA: Insufficient documentation

## 2015-05-13 DIAGNOSIS — I10 Essential (primary) hypertension: Principal | ICD-10-CM | POA: Insufficient documentation

## 2015-05-13 DIAGNOSIS — Z90710 Acquired absence of both cervix and uterus: Secondary | ICD-10-CM | POA: Insufficient documentation

## 2015-05-13 DIAGNOSIS — G459 Transient cerebral ischemic attack, unspecified: Secondary | ICD-10-CM | POA: Diagnosis present

## 2015-05-13 DIAGNOSIS — G510 Bell's palsy: Secondary | ICD-10-CM | POA: Insufficient documentation

## 2015-05-13 DIAGNOSIS — Z833 Family history of diabetes mellitus: Secondary | ICD-10-CM | POA: Insufficient documentation

## 2015-05-13 DIAGNOSIS — Z8711 Personal history of peptic ulcer disease: Secondary | ICD-10-CM | POA: Insufficient documentation

## 2015-05-13 DIAGNOSIS — K2900 Acute gastritis without bleeding: Secondary | ICD-10-CM

## 2015-05-13 MED ORDER — SODIUM CHLORIDE 0.9 % IV SOLN
INTRAVENOUS | Status: DC
  Administered 2015-05-13 – 2015-05-16 (×4): via INTRAVENOUS

## 2015-05-13 MED ORDER — HYDRALAZINE HCL 20 MG/ML IJ SOLN
10.0000 mg | INTRAMUSCULAR | Status: DC | PRN
  Administered 2015-05-13 – 2015-05-14 (×2): 10 mg via INTRAVENOUS
  Filled 2015-05-13 (×2): qty 1

## 2015-05-13 MED ORDER — ONDANSETRON HCL 4 MG PO TABS: 4.0000 mg | ORAL_TABLET | ORAL | Status: DC | PRN

## 2015-05-13 MED ORDER — ACETAMINOPHEN 325 MG PO TABS
650.0000 mg | ORAL_TABLET | Freq: Four times a day (QID) | ORAL | Status: DC | PRN
  Administered 2015-05-15 – 2015-05-16 (×2): 650 mg via ORAL
  Filled 2015-05-13 (×2): qty 2

## 2015-05-13 MED ORDER — ASPIRIN 81 MG PO CHEW: 324.0000 mg | CHEWABLE_TABLET | Freq: Once | ORAL | Status: DC

## 2015-05-13 MED ORDER — ASPIRIN EC 81 MG PO TBEC
81.0000 mg | DELAYED_RELEASE_TABLET | Freq: Every day | ORAL | Status: DC
  Administered 2015-05-14 – 2015-05-16 (×3): 81 mg via ORAL
  Filled 2015-05-13 (×3): qty 1

## 2015-05-13 MED ORDER — ATORVASTATIN CALCIUM 20 MG PO TABS: 20.0000 mg | ORAL_TABLET | Freq: Every day | ORAL | Status: DC

## 2015-05-13 MED ORDER — MORPHINE SULFATE (PF) 2 MG/ML IV SOLN
2.0000 mg | INTRAVENOUS | Status: DC | PRN
  Administered 2015-05-13 – 2015-05-15 (×6): 2 mg via INTRAVENOUS
  Filled 2015-05-13 (×6): qty 1

## 2015-05-13 MED ORDER — ACETAMINOPHEN 650 MG RE SUPP: 650.0000 mg | Freq: Four times a day (QID) | RECTAL | Status: DC | PRN

## 2015-05-13 MED ORDER — HEPARIN SODIUM (PORCINE) 5000 UNIT/ML IJ SOLN
5000.0000 [IU] | Freq: Three times a day (TID) | INTRAMUSCULAR | Status: DC
  Administered 2015-05-13 – 2015-05-16 (×9): 5000 [IU] via SUBCUTANEOUS
  Filled 2015-05-13 (×8): qty 1

## 2015-05-13 MED ORDER — OXYCODONE HCL 5 MG PO TABS
5.0000 mg | ORAL_TABLET | ORAL | Status: DC | PRN
  Administered 2015-05-14: 5 mg via ORAL
  Filled 2015-05-13: qty 1

## 2015-05-13 MED ORDER — ONDANSETRON HCL 4 MG/2ML IJ SOLN
4.0000 mg | INTRAMUSCULAR | Status: DC | PRN
  Administered 2015-05-13 – 2015-05-14 (×3): 4 mg via INTRAVENOUS
  Filled 2015-05-13 (×4): qty 2

## 2015-05-13 MED ORDER — SODIUM CHLORIDE 0.9 % IJ SOLN
3.0000 mL | Freq: Two times a day (BID) | INTRAMUSCULAR | Status: DC
  Administered 2015-05-13: 3 mL via INTRAVENOUS

## 2015-05-13 MED ORDER — ONDANSETRON HCL 4 MG/2ML IJ SOLN
4.0000 mg | Freq: Once | INTRAMUSCULAR | Status: DC
  Administered 2015-05-13: 4 mg via INTRAMUSCULAR

## 2015-05-13 NOTE — [facility] Group Notes (Signed)
[facility] LCSW Group Therapy  05/13/2015 3:09 PM  Type of Therapy:  Group Therapy  Participation Level:  Did Not Attend  Modes of Intervention:  Discussion, Education, Socialization and Support  Summary of Progress/Problems: Communications: Patients identify how individuals communicate with one another appropriately and inappropriately. Patients will be guided to discuss their thoughts, feelings, and behaviors related to barriers when communicating. The group will process together ways to execute positive and appropriate communications   Candace L Hyatt MSW, LCSWA  05/13/2015, 3:09 PM 

## 2015-05-13 NOTE — Progress Notes (Signed)
Pt began c/o numbness in right side of face, blurred vision in right eye and drooping smile on right side. Pt does have a hx of Bells Palsy ... Pt continues to have persistent NV with clear fluid, mucous and cranberry juice. Pt denies chest pain at this time. Writer informed Conservation officer, historic buildings, paged on call MD again and called for a rapid response. Pt is A&OX4, but continues to be hypertensive and tachycardic. Pt being monitored continuously.

## 2015-05-13 NOTE — Progress Notes (Addendum)
[facility] MD Progress Note  05/13/2015 10:49 PM [name]  MRN:  [id number]  Subjective:  [name] complains of nausea this morning but did not complain of vomiting. Her headache was not severe. [name] is still dpressed and anxious but not suicidal. [name] did not participate in programing. As [name] was nauseated yesterday we discontinued Celexa and started Remeron last night. This morning BP was 133/96.  Principal Problem: Major depressive disorder, recurrent, severe with psychotic features Diagnosis:   Patient Active Problem List   Diagnosis Date Noted  . Tobacco use disorder [Z72.0] 05/10/2015  . Cannabis use disorder, moderate, dependence [F12.20] 05/10/2015  . Major depressive disorder, recurrent, severe with psychotic features [F33.3] 05/09/2015  . 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 Chronic headaches   . Hernia   . Hypertension   . Sinusitis   . Bell palsy   . Pancreatitis, acute   . Wrist fracture     Past Surgical History  Procedure Laterality Date  . Cesarean section    . Abdominal hysterectomy     Family History:  Family History  Problem Relation Age of Onset  . Cancer Father   . Diabetes Other    Social History:  History  Alcohol Use No     History  Drug Use  . Yes  . Special: Marijuana    Social History   Social History  . Marital Status: Legally Separated    Spouse Name: N/A  . Number of Children: N/A  . Years of Education: N/A   Social History Main Topics  . Smoking status: Current Every Day Smoker -- 1.00 packs/day for 20 years    Types: Cigarettes  . Smokeless tobacco: Never Used  . Alcohol Use: No  . Drug Use: Yes    Special: Marijuana  . Sexual Activity: No   Other Topics Concern  . None   Social History Narrative   Additional History:    Sleep: Fair  Appetite:  Poor   Assessment:   Musculoskeletal: Strength &  Muscle Tone: within normal limits Gait & Station: normal Patient leans: N/A   Psychiatric Specialty Exam: Physical Exam  Nursing note and vitals reviewed.   Review of Systems  Gastrointestinal: Positive for nausea.  Neurological: Positive for headaches.  All other systems reviewed and are negative.   Blood pressure 148/113, pulse 112, temperature 98.5 F (36.9 C), temperature source Oral, resp. rate 20, height 5\' 6"  (1.676 m), weight 55.339 kg (122 lb), SpO2 95 %.Body mass index is 19.7 kg/(m^2).  General Appearance: Casual  Eye Contact::  Good  Speech:  Clear and Coherent  Volume:  Normal  Mood:  Depressed  Affect:  Flat  Thought Process:  Goal Directed  Orientation:  Full (Time, Place, and Person)  Thought Content:  WDL  Suicidal Thoughts:  No  Homicidal Thoughts:  No  Memory:  Immediate;   Fair Recent;   Fair Remote;   Fair  Judgement:  Fair  Insight:  Fair  Psychomotor Activity:  Decreased  Concentration:  Fair  Recall:  [employer] of Knowledge:Fair  Language: Fair  Akathisia:  No  Handed:  Right  AIMS (if indicated):     Assets:  Communication Skills Desire for Improvement Physical Health Resilience  ADL's:  Intact  Cognition: WNL  Sleep:  Number of Hours: 6.75     Current Medications: Current Facility-Administered Medications  Medication Dose Route Frequency Provider Last Rate Last Dose  . alum & mag hydroxide-simeth (MAALOX/MYLANTA) 200-200-20 MG/5ML suspension 30 mL  30 mL Oral Q4H PRN Gonzella Lex, MD      . hydrochlorothiazide (HYDRODIURIL) tablet 25 mg  25 mg Oral Daily Jolanta B Pucilowska, MD   25 mg at 05/13/15 0959  . magnesium hydroxide (MILK OF MAGNESIA) suspension 30 mL  30 mL Oral Daily PRN Gonzella Lex, MD      . mirtazapine (REMERON) tablet 15 mg  15 mg Oral QHS Clovis Fredrickson, MD   15 mg at 05/12/15 2151  . nicotine (NICODERM CQ - dosed in mg/24 hours) patch 21 mg  21 mg Transdermal Daily Jolanta B Pucilowska, MD   21 mg at  05/12/15 1002  . promethazine (PHENERGAN) tablet 25 mg  25 mg Oral Q6H PRN Clovis Fredrickson, MD   25 mg at 05/13/15 1553  . SUMAtriptan (IMITREX) tablet 50 mg  50 mg Oral Q2H PRN Clovis Fredrickson, MD   50 mg at 05/13/15 1553  . topiramate (TOPAMAX) tablet 50 mg  50 mg Oral QHS Clovis Fredrickson, MD   50 mg at 05/12/15 2151  . traZODone (DESYREL) tablet 100 mg  100 mg Oral QHS Clovis Fredrickson, MD   100 mg at 05/12/15 2151    Lab Results: No results found for this or any previous visit (from the past 93 hour(s)).  Physical Findings: AIMS: Facial and Oral Movements Muscles of Facial Expression: None, normal Lips and Perioral Area: None, normal Jaw: None, normal Tongue: None, normal,Extremity Movements Upper (arms, wrists, hands, fingers): None, normal Lower (legs, knees, ankles, toes): None, normal, Trunk Movements Neck, shoulders, hips: None, normal, Overall Severity Severity of abnormal movements (highest score from questions above): None, normal Incapacitation due to abnormal movements: None, normal Patient's awareness of abnormal movements (rate only patient's report): No Awareness, Dental Status Current problems with teeth and/or dentures?: No Does patient usually wear dentures?: No  CIWA:    COWS:     Treatment Plan Summary: Daily contact with patient to assess and evaluate symptoms and progress in treatment and Medication management      Medical Decision Making:  New problem, with additional work up planned, Review of Psycho-Social Stressors (1), Review or order clinical lab tests (1), Review of Medication Regimen & Side Effects (2) and Review of New Medication or Change in Dosage (2)   [name] is a 49 year old female with a history of depression admitted for suicidal ideation in the context of major loss and severe social stressors.  1. Suicidal ideation. The patient is able to contract for safety in the hospital.  2. Mood. We discontinued Celexa and  started Remeron.  3. Migraine headaches. We will offer Imitrex as needed and Topamax for headache prevention.  4. Hypertension. We started hydrochlorothiazide.   5. Smoking. Nicotine patch is available.  6. Insomnia. We'll offer trazodone.   7. Nausea. Likely due to Celexa. Celexa was discontinued.   8. Disposition. [name] will be discharged to home with her mother. [name] will follow up with [facility].       [name] 05/13/2015, 10:49 PM

## 2015-05-13 NOTE — Plan of Care (Signed)
Problem: Alteration in mood Goal: LTG-Patient reports reduction in suicidal thoughts (Patient reports reduction in suicidal thoughts and is able to verbalize a safety plan for whenever patient is feeling suicidal)  Outcome: Progressing Pt denies SI     

## 2015-05-13 NOTE — [facility] Suicide Risk Assessment (Signed)
[facility] Discharge Suicide Risk Assessment   Demographic Factors:  Caucasian, Low socioeconomic status and Unemployed  Total Time spent with patient: 20 minutes  Musculoskeletal: Strength & Muscle Tone: within normal limits Gait & Station: normal Patient leans: N/A  Psychiatric Specialty Exam: Physical Exam  Nursing note and vitals reviewed.   Review of Systems  Eyes: Positive for blurred vision.  Gastrointestinal: Positive for nausea and vomiting.  Neurological: Positive for headaches.  All other systems reviewed and are negative.   Blood pressure 148/113, pulse 112, temperature 98.5 F (36.9 C), temperature source Oral, resp. rate 20, height 5\' 6"  (1.676 m), weight 55.339 kg (122 lb), SpO2 95 %.Body mass index is 19.7 kg/(m^2).  General Appearance: Casual  Eye Contact::  Good  Speech:  Clear and [id number]  Volume:  Normal  Mood:  Depressed  Affect:  Flat  Thought Process:  Goal Directed  Orientation:  Full (Time, Place, and Person)  Thought Content:  WDL  Suicidal Thoughts:  No  Homicidal Thoughts:  No  Memory:  Immediate;   Fair Recent;   Fair Remote;   Fair  Judgement:  Fair  Insight:  Fair  Psychomotor Activity:  Decreased  Concentration:  Fair  Recall:  [employer] of Knowledge:Fair  Language: Fair  Akathisia:  No  Handed:  Right  AIMS (if indicated):     Assets:  Communication Skills Desire for Improvement Resilience  Sleep:  Number of Hours: 6.75  Cognition: WNL  ADL's:  Intact   Have you used any form of tobacco in the last 30 days? (Cigarettes, Smokeless Tobacco, Cigars, and/or Pipes): Yes  Has this patient used any form of tobacco in the last 30 days? (Cigarettes, Smokeless Tobacco, Cigars, and/or Pipes) Yes, A prescription for an FDA-approved tobacco cessation medication was offered at discharge and the patient refused  Mental Status Per Nursing Assessment::   On Admission:  NA  Current Mental Status by Physician: NA  Loss Factors: Loss of  significant relationship and Financial problems/change in socioeconomic status  Historical Factors: NA  Risk Reduction Factors:   Sense of responsibility to family and Living with another person, especially a relative  Continued Clinical Symptoms:  Depression:   Severe  Cognitive Features That Contribute To Risk:  None    Suicide Risk:  Minimal: No identifiable suicidal ideation.  Patients presenting with no risk factors but with morbid ruminations; may be classified as minimal risk based on the severity of the depressive symptoms  Principal Problem: Major depressive disorder, recurrent, severe with psychotic features Discharge Diagnoses:  Patient Active Problem List   Diagnosis Date Noted  . Hypertensive urgency [I10] 05/13/2015  . Tobacco use disorder [Z72.0] 05/10/2015  . Cannabis use disorder, moderate, dependence [F12.20] 05/10/2015  . Major depressive disorder, recurrent, severe with psychotic features [F33.3] 05/09/2015  . Complicated grief [X72.62] 05/09/2015      Plan Of Care/Follow-up recommendations:  Activity:  as tolerated. Diet:  regular. Other:  keep follow up appointments.  Is patient on multiple antipsychotic therapies at discharge:  No   Has Patient had three or more failed trials of antipsychotic monotherapy by history:  No  Recommended Plan for Multiple Antipsychotic Therapies: NA    [name] 05/13/2015, 11:09 PM

## 2015-05-13 NOTE — Progress Notes (Signed)
Patient is alert and oriented, she complains of nausea and vomiting, she is prescribed phenergan which doesn't appear to be working.  Patient has had N/V after medications.  Patient has attended groups today and interacts well with peers.  Patient denies SI/HI/AVH at this time. Will continue to monitor for safety.
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
## 2015-05-13 NOTE — H&P (Addendum)
[facility] [place] NAME: [name]    MR#:  [id number]  DATE OF BIRTH:  November 15, 1965   DATE OF ADMISSION:  05/13/2015  PRIMARY CARE PHYSICIAN: No PCP Per Patient   REQUESTING/REFERRING PHYSICIAN: Psychiatry  CHIEF COMPLAINT:  No chief complaint on file.  nausea/vomiting, right-sided facial droop  HISTORY OF PRESENT ILLNESS:  [name]  is a 49 y.o. female with a known history of depression, essential hypertension Bell's palsy she was originally admitted on 05/10/2015 to the psychiatry service for depression. She's been experiencing nausea and vomiting for 1 day in duration nonbloody nonbilious emesis noted to have a headache described only as painful, no worsening or relieving factors, she also noted having some numbness and tingling to the right side of her face. She was evaluated by these nursing staff noted to have a right-sided facial droop prompting a rapid response to be called I was subsequently called by the psychiatry doctor on call for further evaluation. She still complains of nausea and vomiting headache somewhat improved at this time.  PAST MEDICAL HISTORY:   Past Medical History  Diagnosis Date  . Chronic headaches   . Hernia   . Hypertension   . Sinusitis   . Bell palsy   . Pancreatitis, acute   . Wrist fracture     PAST SURGICAL HISTORY:   Past Surgical History  Procedure Laterality Date  . Cesarean section    . Abdominal hysterectomy      SOCIAL HISTORY:   Social History  Substance Use Topics  . Smoking status: Current Every Day Smoker -- 1.00 packs/day for 20 years    Types: Cigarettes  . Smokeless tobacco: Never Used  . Alcohol Use: No    FAMILY HISTORY:   Family History  Problem Relation Age of Onset  . Cancer Father   . Diabetes Other     DRUG ALLERGIES:  No Known Allergies  REVIEW OF SYSTEMS:  REVIEW OF SYSTEMS:  CONSTITUTIONAL: Denies fevers, chills, fatigue, weakness.   EYES: Positive blurred vision right eye, denies double vision, or eye pain.  EARS, NOSE, THROAT: Denies tinnitus, ear pain, hearing loss.  RESPIRATORY: denies cough, shortness of breath, wheezing  CARDIOVASCULAR: Denies chest pain, palpitations, edema.  GASTROINTESTINAL: Positive nausea, vomiting, denies diarrhea, abdominal pain.  GENITOURINARY: Denies dysuria, hematuria.  ENDOCRINE: Denies nocturia or thyroid problems. HEMATOLOGIC AND LYMPHATIC: Denies easy bruising or bleeding.  SKIN: Denies rash or lesions.  MUSCULOSKELETAL: Denies pain in neck, back, shoulder, knees, hips, or further arthritic symptoms.  NEUROLOGIC: Denies paralysis, positive paresthesias right face.  PSYCHIATRIC: Denies anxiety positive depressive symptoms. Denies suicidal ideation Otherwise full review of systems performed by me is negative.   MEDICATIONS AT HOME:   Prior to Admission medications   Not on File      VITAL SIGNS:  There were no vitals taken for this visit.  PHYSICAL EXAMINATION:  VITAL SIGNS:There were no vitals filed for this visit. GENERAL:49 y.o.female currently in minimal acute distress given active nausea/vomiting.  HEAD: Normocephalic, atraumatic.  EYES: Pupils equal, round, reactive to light. Extraocular muscles intact. No scleral icterus.  MOUTH: Moist mucosal membrane. Dentition intact. No abscess noted.  EAR, NOSE, THROAT: Clear without exudates. No external lesions.  NECK: Supple. No thyromegaly. No nodules. No JVD.  PULMONARY: Clear to ascultation, without wheeze rails or rhonci. No use of accessory muscles, Good respiratory effort. good air entry bilaterally CHEST: Nontender to palpation.  CARDIOVASCULAR: S1 and S2.  Regular rate and rhythm. No murmurs, rubs, or gallops. No edema. Pedal pulses 2+ bilaterally.  GASTROINTESTINAL: Soft, nontender, nondistended. No masses. Positive bowel sounds. No hepatosplenomegaly.  MUSCULOSKELETAL: No swelling, clubbing, or edema. Range of  motion full in all extremities.  NEUROLOGIC: Prominent right-sided facial droop involving mouth and eye remainder of cranial nerves within normal limits pronator drift within normal limits strength 5/5 in all extremities including proximal/distal flexion and extension SKIN: No ulceration, lesions, rashes, or cyanosis. Skin warm and dry. Turgor intact.  PSYCHIATRIC: Mood, affect within normal limits. The patient is awake, alert and oriented x 3. Insight, judgment intact.    LABORATORY PANEL:   CBC  Recent Labs Lab 05/09/15 1414  WBC 8.9  HGB 14.8  HCT 44.0  PLT 232   ------------------------------------------------------------------------------------------------------------------  Chemistries   Recent Labs Lab 05/09/15 1414  NA 142  K 4.3  CL 110  CO2 24  GLUCOSE 110*  BUN 22*  CREATININE 0.65  CALCIUM 9.3  AST 17  ALT 8*  ALKPHOS 50  BILITOT 1.0   ------------------------------------------------------------------------------------------------------------------  Cardiac Enzymes No results for input(s): TROPONINI in the last 168 hours. ------------------------------------------------------------------------------------------------------------------  RADIOLOGY:  No results found.  EKG:   Orders placed or performed in visit on 09/18/14  . EKG 12-Lead    IMPRESSION AND PLAN:   49 year old Caucasian female history of depression who is originally admitted on 05/10/2015 for the same now complaining of persistent nausea/vomiting with associated right-sided facial droop.  1. Hypertensive urgency: Noted elevated blood pressures with diastolic greater than 454 we'll place on telemetry add when necessary hydralazine is likely that she did vomit her hydrochlorothiazide earlier in the day and subsequently did not get that dose. 2. Facial Droop: Initiate aspirin/statin therapy, check CT head noncontrast, given that the involvement includes upper and lower components facial  nerve I suspect this is most likely related to Bell's palsy however will continue to be thorough until stroke is ruled out. 3. Persistent nausea: Has only received a dose of Phenergan oral numerous hours ago will add when necessary Zofran IV, check labs including electrolytes and lipase 4. Depression no specified: Consult psychiatry for continuation of care 5. Venous thromboembolism prophylactic: Heparin subcutaneous    All the records are reviewed and case discussed with ED provider. Management plans discussed with the patient, family and they are in agreement.  CODE STATUS: Full  TOTAL TIME TAKING CARE OF THIS PATIENT: 35 minutes.    Hower,  Karenann Cai.D on 05/13/2015 at 11:10 PM  Between 7am to 6pm - Pager - [phone]  After 6pm: House Pager: - [phone]  [facility]  Office  [phone]  CC: Primary care physician; No PCP Per Patient

## 2015-05-13 NOTE — Progress Notes (Signed)
D: Pt is asleep in bed this evening. Pt mood is depressed and her affect is sad/flat. Pt denies SI/HI and AVH at this time, although she is withdrawn and has minimal interaction with staff or peers. She is pleasant and cooperative with staff.  A: Writer provided emotional support and administered medications as prescribed.  R: Pt behavior is appropriate on the unit, but she does isolate to her room most of the time. Pt returned to bed following medication administration.

## 2015-05-13 NOTE — Progress Notes (Signed)
Writer administered IM Zofran and prepared for transfer to Telemetry unit. Transportation arrived at that time with a wheel chair. Pt remained alert and oriented, but needed assistance transferring to the chair. Pt left by wheel chair with blanket and basin.

## 2015-05-13 NOTE — Progress Notes (Signed)
Patient ID: CLARICE ZULAUF, female   DOB: 01/29/1966, 49 y.o.   MRN: [id number] I was informed by her nurse that Ms. Talalh started vomiting in the afternoon and could not hold down any medications or liquids. We agreed that Medicine consult would be appropriate. Later, she complained of blurred vision in the right eye and tingling in the right side of her face. Her blood pressure became elevated and rapid respose team was called. I spoke with Dr. Dorene Sorrow who recommended transfer to medical floor or observation.
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
## 2015-05-13 NOTE — Progress Notes (Signed)
Pt has persistent NV with dry heaves and stomach acids, phlem and clear fluid. Pt is unable to keep food or fluids down and c/o HA 7/10 in front and base of neck. Pt denies chest pain or other symptoms. Pt is dizzy and lightheaded when standing. She is also hypertensive w/o orthostatic hypotension. HR is elevated but regular. MD gave telephone order to change consult from STAT to URGENT. Teacher, English as a foreign language of situation. Writer provided cranberry juice and crackers to pt as tolerated. She refused ginger ale.

## 2015-05-14 DIAGNOSIS — I10 Essential (primary) hypertension: Principal | ICD-10-CM

## 2015-05-14 LAB — URINALYSIS COMPLETE WITH MICROSCOPIC (ARMC ONLY)
BACTERIA UA: NONE SEEN
BILIRUBIN URINE: NEGATIVE
Glucose, UA: 50 mg/dL — AB
Hgb urine dipstick: NEGATIVE
Leukocytes, UA: NEGATIVE
Nitrite: NEGATIVE
PH: 7 (ref 5.0–8.0)
Protein, ur: NEGATIVE mg/dL
Specific Gravity, Urine: 1.014 (ref 1.005–1.030)

## 2015-05-14 LAB — CBC
HEMATOCRIT: 50.7 % — AB (ref 35.0–47.0)
HEMOGLOBIN: 17.3 g/dL — AB (ref 12.0–16.0)
MCH: 31.7 pg (ref 26.0–34.0)
MCHC: 34.2 g/dL (ref 32.0–36.0)
MCV: 92.7 fL (ref 80.0–100.0)
Platelets: 201 10*3/uL (ref 150–440)
RBC: 5.47 MIL/uL — AB (ref 3.80–5.20)
RDW: 12.3 % (ref 11.5–14.5)
WBC: 10.4 10*3/uL (ref 3.6–11.0)

## 2015-05-14 LAB — COMPREHENSIVE METABOLIC PANEL
ALK PHOS: 55 U/L (ref 38–126)
ALT: 14 U/L (ref 14–54)
AST: 27 U/L (ref 15–41)
Albumin: 5.5 g/dL — ABNORMAL HIGH (ref 3.5–5.0)
Anion gap: 17 — ABNORMAL HIGH (ref 5–15)
BILIRUBIN TOTAL: 1.3 mg/dL — AB (ref 0.3–1.2)
BUN: 52 mg/dL — ABNORMAL HIGH (ref 6–20)
CALCIUM: 9.8 mg/dL (ref 8.9–10.3)
CO2: 24 mmol/L (ref 22–32)
CREATININE: 1.44 mg/dL — AB (ref 0.44–1.00)
Chloride: 94 mmol/L — ABNORMAL LOW (ref 101–111)
GFR, EST AFRICAN AMERICAN: 49 mL/min — AB (ref >60)
GFR, EST NON AFRICAN AMERICAN: 42 mL/min — AB (ref >60)
Glucose, Bld: 215 mg/dL — ABNORMAL HIGH (ref 65–99)
Potassium: 3.5 mmol/L (ref 3.5–5.1)
Sodium: 135 mmol/L (ref 135–145)
Total Protein: 8.4 g/dL — ABNORMAL HIGH (ref 6.5–8.1)

## 2015-05-14 LAB — LIPID PANEL
CHOLESTEROL: 341 mg/dL — AB (ref 0–200)
HDL: 30 mg/dL — ABNORMAL LOW (ref >40)
LDL Cholesterol: 241 mg/dL — ABNORMAL HIGH (ref 0–99)
Total CHOL/HDL Ratio: 11.4 RATIO
Triglycerides: 349 mg/dL — ABNORMAL HIGH (ref <150)
VLDL: 70 mg/dL — AB (ref 0–40)

## 2015-05-14 LAB — HEMOGLOBIN A1C: Hgb A1c MFr Bld: 5.4 % (ref 4.0–6.0)

## 2015-05-14 LAB — LIPASE, BLOOD: LIPASE: 13 U/L — AB (ref 22–51)

## 2015-05-14 MED ORDER — POTASSIUM CHLORIDE CRYS ER 20 MEQ PO TBCR
40.0000 meq | EXTENDED_RELEASE_TABLET | Freq: Once | ORAL | Status: DC
  Filled 2015-05-14: qty 2

## 2015-05-14 MED ORDER — METOPROLOL TARTRATE 50 MG PO TABS
50.0000 mg | ORAL_TABLET | Freq: Two times a day (BID) | ORAL | Status: DC
  Administered 2015-05-14 – 2015-05-16 (×4): 50 mg via ORAL
  Filled 2015-05-14 (×5): qty 1

## 2015-05-14 MED ORDER — PANTOPRAZOLE SODIUM 40 MG PO TBEC
40.0000 mg | DELAYED_RELEASE_TABLET | Freq: Two times a day (BID) | ORAL | Status: DC
  Administered 2015-05-14 (×2): 40 mg via ORAL
  Filled 2015-05-14 (×3): qty 1

## 2015-05-14 MED ORDER — SUCRALFATE 1 G PO TABS
1.0000 g | ORAL_TABLET | Freq: Three times a day (TID) | ORAL | Status: DC
  Administered 2015-05-14 – 2015-05-16 (×8): 1 g via ORAL
  Filled 2015-05-14 (×8): qty 1

## 2015-05-14 MED ORDER — HYDRALAZINE HCL 25 MG PO TABS
25.0000 mg | ORAL_TABLET | Freq: Three times a day (TID) | ORAL | Status: DC
  Administered 2015-05-14 – 2015-05-16 (×5): 25 mg via ORAL
  Filled 2015-05-14 (×5): qty 1

## 2015-05-14 MED ORDER — ATORVASTATIN CALCIUM 20 MG PO TABS
40.0000 mg | ORAL_TABLET | Freq: Every day | ORAL | Status: DC
  Administered 2015-05-14 – 2015-05-15 (×2): 40 mg via ORAL
  Filled 2015-05-14 (×2): qty 2

## 2015-05-14 NOTE — Progress Notes (Signed)
Talked with Dr. Bonney Roussel by phone regarding concern for patients blood pressure/condition and the need for her to contact the hospitalist so that we can quickly treat patient. Dr. Lavetta Nielsen information given to Dr. Bonney Roussel.

## 2015-05-14 NOTE — Progress Notes (Signed)
Pt nauseated and c/o headache dring day. Med for both. Pt unable to take afternoon meds due to this. ivf's infusing. Voiding inbac

## 2015-05-14 NOTE — Progress Notes (Signed)
Recreation Therapy Notes  INPATIENT RECREATION TR PLAN  Patient Details Name: [name] MRN: [id number] DOB: [birth year] Today's Date: 05/14/2015  Rec Therapy Plan Is patient appropriate for Therapeutic Recreation?: Yes Treatment times per week: At least once a week TR Treatment/Interventions: 1:1 session, Group participation (Comment) (Appropriate pariticipation in daily recreation therapy tx)  Discharge Criteria Pt will be discharged from therapy if:: Discharged Treatment plan/goals/alternatives discussed and agreed upon by:: Patient/family  Discharge Summary Short term goals set: See Care Plan Short term goals met: Adequate for discharge Progress toward goals comments: One-to-one attended One-to-one attended: Self-esteem, time management Reason goals not met: Patient d/c from hospital before goals could be met Therapeutic equipment acquired: None Reason patient discharged from therapy: Discharge from hospital Pt/family agrees with progress & goals achieved: Yes Date patient discharged from therapy: 05/13/15   Leonette Monarch, LRT/CTRS 05/14/2015, 2:45 PM

## 2015-05-14 NOTE — Consult Note (Signed)
  Psychiatry: This is a follow-up consult note for this 49 year old woman with a history of severe depression. I saw her last week in the emergency room and admitted her to the psychiatry ward. She was transferred to the internal medicine service because of sustained very high blood pressure. It appears that her blood pressure has been treated well and is down to close to the normal range, but now she is complaining of several other medical problems. On interview today the patient complains of nausea and vomiting. Says she has not been able to keep down any food or drink today. She is still feeling depressed and sad but denies any suicidal ideation. Denies any hallucinations. On mental status this is a sickly appearing woman in bed holding an emesis bag. Minimal eye contact. Speech quiet and decreased in amount. Affect flat. Mood stated as depressed. Thoughts lucid but slow. No evidence of delusions. Denies auditory or visual hallucinations. Denies suicidal or homicidal ideation or intent. Alert and oriented 4. Judgment and insight are reasonably intact.  Continue current medication. I had anticipated possibly planning for transfer back to psychiatry when I saw that her blood pressure was improved but now that she is having more medical problems I will defer to the internal medicine service. Patient will be seen again daily and we will facilitate transfer back to psychiatry as soon as they feel she is medically stable.  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

## 2015-05-14 NOTE — Progress Notes (Deleted)
Talked with Dr. Bonney Roussel by phone regarding concern for patients blood pressure/condition and the need for her to contact the hospitalist so that we can quickly treat patient. Dr. Lavetta Nielsen information given to Dr. Bonney Roussel.

## 2015-05-14 NOTE — Care Management (Signed)
Patient admitted under observation to 2A from behavior med after a rapid response called for facial droop and hypertensive urgency. patient does have history of bells palsy.  Has received one dose of IV hydralazine since admission.  Head CT is negative.  When blood pressure stabilizes would anticipated patient to be readmitted to beh med for continued treatment of severe depression

## 2015-05-14 NOTE — Progress Notes (Signed)
[place] [facility] [place] NAME: [name]    MR#:  [id number]  DATE OF BIRTH:  [birth year]  SUBJECTIVE:  CHIEF COMPLAINT:  No chief complaint on file.  - complains of significant abdominal pain, epigastric - admits to taking a lot of BC powders as outpatient - has headache as well. BP elevated  REVIEW OF SYSTEMS:  Review of Systems  Constitutional: Negative for fever and chills.  HENT: Negative for congestion, ear discharge, ear pain and nosebleeds.   Respiratory: Negative for cough, shortness of breath and wheezing.   Cardiovascular: Negative for chest pain, palpitations, leg swelling and PND.  Gastrointestinal: Positive for nausea and abdominal pain. Negative for vomiting, diarrhea and constipation.  Genitourinary: Negative for dysuria.  Neurological: Positive for headaches. Negative for dizziness, tingling, tremors, sensory change and seizures.  Psychiatric/Behavioral: Negative for depression and substance abuse. The patient is not nervous/anxious.     DRUG ALLERGIES:  No Known Allergies  VITALS:  Blood pressure 143/85, pulse 118, temperature 99.2 F (37.3 C), temperature source Oral, resp. rate 18, height 5\' 6"  (1.676 m), weight 50.894 kg (112 lb 3.2 oz), SpO2 99 %.  PHYSICAL EXAMINATION:  Physical Exam  GENERAL:  49 y.o.-year-old patient lying in the bed with no acute distress.  EYES: Pupils equal, round, reactive to light and accommodation. No scleral icterus. Extraocular muscles intact.  HEENT: Head atraumatic, normocephalic. Oropharynx and nasopharynx clear. Slight right facial droop noted NECK:  Supple, no jugular venous distention. No thyroid enlargement, no tenderness.  LUNGS: Normal breath sounds bilaterally, no wheezing, rales,rhonchi or crepitation. No use of accessory muscles of respiration.  CARDIOVASCULAR: S1, S2 normal. No murmurs, rubs, or gallops.  ABDOMEN: Soft, epigastric tenderness, nondistended. Bowel  sounds present. No organomegaly or mass.  EXTREMITIES: No pedal edema, cyanosis, or clubbing.  NEUROLOGIC: Cranial nerves II through XII are intact. Muscle strength 5/5 in all extremities. Sensation intact. Gait not checked.  PSYCHIATRIC: The patient is alert and oriented x 3.  SKIN: No obvious rash, lesion, or ulcer.    LABORATORY PANEL:   CBC  Recent Labs Lab 05/14/15 0438  WBC 10.4  HGB 17.3*  HCT 50.7*  PLT 201   ------------------------------------------------------------------------------------------------------------------  Chemistries   Recent Labs Lab 05/14/15 0438  NA 135  K 3.5  CL 94*  CO2 24  GLUCOSE 215*  BUN 52*  CREATININE 1.44*  CALCIUM 9.8  AST 27  ALT 14  ALKPHOS 55  BILITOT 1.3*   ------------------------------------------------------------------------------------------------------------------  Cardiac Enzymes No results for input(s): TROPONINI in the last 168 hours. ------------------------------------------------------------------------------------------------------------------  RADIOLOGY:  Ct Head Wo Contrast  05/14/2015   CLINICAL DATA:  Initial evaluation for acute right facial numbness, blurry right vision. History of Bell's palsy.  EXAM: CT HEAD WITHOUT CONTRAST  TECHNIQUE: Contiguous axial images were obtained from the base of the skull through the vertex without intravenous contrast.  COMPARISON:  Prior CT from 04/01/2013.  FINDINGS: There is no acute intracranial hemorrhage or infarct. No mass lesion or midline shift. Gray-white matter differentiation is well maintained. Ventricles are normal in size without evidence of hydrocephalus. CSF containing spaces are within normal limits. No extra-axial fluid collection.  The calvarium is intact.  Orbital soft tissues are within normal limits.  Scattered frothy secretions present within the bilateral frontal sinuses, right greater than left. Visualized paranasal sinuses are otherwise clear.  Minimal opacity within the posterior inferior right mastoid air cells. Left mastoid air cells clear. Middle ear cavities clear.  Scalp soft tissues are unremarkable.  IMPRESSION: 1. Negative head CT with no acute intracranial process identified. 2. Mild frontal sinus disease. 3. Small right mastoid effusion.   Electronically Signed   By: Jeannine Boga M.D.   On: 05/14/2015 00:05    EKG:   Orders placed or performed in visit on 09/18/14  . EKG 12-Lead    ASSESSMENT AND PLAN:   49 year old female with history of depression, hypertension, Bell's palsy is admitted to behavioral medicine for treatment of depression. However readmitted to medical floor for hypertensive urgency.  #1 hypertensive urgency-with headache and neurological symptoms. -No history of hypertension but not taking any medications at home. -Started on by mouth metoprolol and by mouth hydralazine. Continue IV hydralazine when necessary for now.  #2 abdominal pain-likely has gastritis with history of gastric ulcers -Takes BC powders at home. -Started on Protonix twice a day, Carafate and pain medications as needed. -Started on clear liquid diet for now. If does not improve we'll consult GI.  #3 acute renal failure-likely dehydration, prerenal. -IV fluids -Follow up in a.m. avoid nephrotoxins  #4 depression-patient will be discharged back to behavioral medicine unit once medically clear  #5 tobacco use disorder-will start on Nicotrol.  #6 hyperlipidemia-increase her statin dose  #7 hypokalemia-being replaced  #8 DVT prophylaxis-subcutaneous heparin  All the records are reviewed and case discussed with Care Management/Social Workerr. Management plans discussed with the patient, family and they are in agreement.  CODE STATUS: Full code  TOTAL TIME TAKING CARE OF THIS PATIENT: 36  minutes.   POSSIBLE D/C IN 1-2 DAYS, DEPENDING ON CLINICAL CONDITION.   Gladstone Lighter M.D on 05/14/2015 at 4:20  PM  Between 7am to 6pm - Pager - [phone]  After 6pm go to [email or web address] - password [email or web address] [facility]  [facility]  Office  [phone]  CC: Primary care physician; No PCP Per Patient

## 2015-05-14 NOTE — Progress Notes (Signed)
   05/14/15 1015  Clinical Encounter Type  Visited With Patient  Visit Type Initial;Psychological support;Spiritual support;Social support  Consult/Referral To Chaplain  Spiritual Encounters  Spiritual Needs Sacred text;Prayer;Emotional;Grief support  Stress Factors  Patient Stress Factors Exhausted;Family relationships;Loss  Met w/patient during routine rounding. Patient shared her grief due to family loss. Provided pastoral care, readings, and prayer. Chap. Danny G. [place]

## 2015-05-14 NOTE — Progress Notes (Signed)
Received information regarding patients condition by Zandra Abts. I paged Dr.Pucilawska.  I called Dr. Bonney Roussel on her cell phone and left a detailed message of of patients condition, my concern, and need to call me or unit back.

## 2015-05-15 LAB — BASIC METABOLIC PANEL
ANION GAP: 6 (ref 5–15)
BUN: 35 mg/dL — ABNORMAL HIGH (ref 6–20)
CALCIUM: 8.8 mg/dL — AB (ref 8.9–10.3)
CO2: 25 mmol/L (ref 22–32)
Chloride: 104 mmol/L (ref 101–111)
Creatinine, Ser: 0.67 mg/dL (ref 0.44–1.00)
GFR calc Af Amer: >60 mL/min (ref >60)
GFR calc non Af Amer: >60 mL/min (ref >60)
Glucose, Bld: 112 mg/dL — ABNORMAL HIGH (ref 65–99)
POTASSIUM: 3.8 mmol/L (ref 3.5–5.1)
Sodium: 135 mmol/L (ref 135–145)

## 2015-05-15 MED ORDER — NICOTINE 21 MG/24HR TD PT24
21.0000 mg | MEDICATED_PATCH | Freq: Every day | TRANSDERMAL | Status: DC
  Administered 2015-05-15 – 2015-05-16 (×2): 21 mg via TRANSDERMAL
  Filled 2015-05-15 (×2): qty 1

## 2015-05-15 MED ORDER — NON FORMULARY
40.0000 mg | Freq: Two times a day (BID) | Status: DC
Start: 2015-05-15 — End: 2015-05-15

## 2015-05-15 MED ORDER — SODIUM CHLORIDE 0.9 % IJ SOLN: 3.0000 mL | INTRAMUSCULAR | Status: DC | PRN

## 2015-05-15 MED ORDER — OMEPRAZOLE 20 MG PO CPDR
20.0000 mg | DELAYED_RELEASE_CAPSULE | Freq: Two times a day (BID) | ORAL | Status: DC
  Administered 2015-05-15 – 2015-05-16 (×2): 20 mg via ORAL
  Filled 2015-05-15 (×4): qty 1

## 2015-05-15 MED ORDER — ZOLPIDEM TARTRATE 5 MG PO TABS
5.0000 mg | ORAL_TABLET | Freq: Every evening | ORAL | Status: DC | PRN
  Administered 2015-05-15: 5 mg via ORAL
  Filled 2015-05-15: qty 1

## 2015-05-15 NOTE — Progress Notes (Signed)
[facility] [place] NAME: [name]    MR#:  [id number]  DATE OF BIRTH:  1966-01-12  SUBJECTIVE:  CHIEF COMPLAINT:  No chief complaint on file.  - Nausea and vomiting have improved. Much improved abdominal pain. Requesting to upgrade diet. -Blood pressure is better controlled at this time.  REVIEW OF SYSTEMS:  Review of Systems  Constitutional: Negative for fever and chills.  HENT: Negative for congestion, ear discharge, ear pain and nosebleeds.   Respiratory: Negative for cough, shortness of breath and wheezing.   Cardiovascular: Negative for chest pain, palpitations, leg swelling and PND.  Gastrointestinal: Positive for nausea and abdominal pain. Negative for vomiting, diarrhea and constipation.  Genitourinary: Negative for dysuria.  Neurological: Positive for headaches. Negative for dizziness, tingling, tremors, sensory change and seizures.  Psychiatric/Behavioral: Negative for depression and substance abuse. The patient is not nervous/anxious.     DRUG ALLERGIES:  No Known Allergies  VITALS:  Blood pressure 110/69, pulse 73, temperature 99.1 F (37.3 C), temperature source Oral, resp. rate 18, height 5\' 6"  (1.676 m), weight 50.894 kg (112 lb 3.2 oz), SpO2 99 %.  PHYSICAL EXAMINATION:  Physical Exam  GENERAL:  49 y.o.-year-old patient lying in the bed with no acute distress.  EYES: Pupils equal, round, reactive to light and accommodation. No scleral icterus. Extraocular muscles intact.  HEENT: Head atraumatic, normocephalic. Oropharynx and nasopharynx clear. Slight right facial droop noted NECK:  Supple, no jugular venous distention. No thyroid enlargement, no tenderness.  LUNGS: Normal breath sounds bilaterally, no wheezing, rales,rhonchi or crepitation. No use of accessory muscles of respiration.  CARDIOVASCULAR: S1, S2 normal. No murmurs, rubs, or gallops.  ABDOMEN: Soft, epigastric tenderness, nondistended. Bowel  sounds present. No organomegaly or mass.  EXTREMITIES: No pedal edema, cyanosis, or clubbing.  NEUROLOGIC: Cranial nerves II through XII are intact. Muscle strength 5/5 in all extremities. Sensation intact. Gait not checked.  PSYCHIATRIC: The patient is alert and oriented x 3.  SKIN: No obvious rash, lesion, or ulcer.    LABORATORY PANEL:   CBC  Recent Labs Lab 05/14/15 0438  WBC 10.4  HGB 17.3*  HCT 50.7*  PLT 201   ------------------------------------------------------------------------------------------------------------------  Chemistries   Recent Labs Lab 05/14/15 0438 05/15/15 0750  NA 135 135  K 3.5 3.8  CL 94* 104  CO2 24 25  GLUCOSE 215* 112*  BUN 52* 35*  CREATININE 1.44* 0.67  CALCIUM 9.8 8.8*  AST 27  --   ALT 14  --   ALKPHOS 55  --   BILITOT 1.3*  --    ------------------------------------------------------------------------------------------------------------------  Cardiac Enzymes No results for input(s): TROPONINI in the last 168 hours. ------------------------------------------------------------------------------------------------------------------  RADIOLOGY:  Ct Head Wo Contrast  05/14/2015   CLINICAL DATA:  Initial evaluation for acute right facial numbness, blurry right vision. History of Bell's palsy.  EXAM: CT HEAD WITHOUT CONTRAST  TECHNIQUE: Contiguous axial images were obtained from the base of the skull through the vertex without intravenous contrast.  COMPARISON:  Prior CT from 04/01/2013.  FINDINGS: There is no acute intracranial hemorrhage or infarct. No mass lesion or midline shift. Gray-white matter differentiation is well maintained. Ventricles are normal in size without evidence of hydrocephalus. CSF containing spaces are within normal limits. No extra-axial fluid collection.  The calvarium is intact.  Orbital soft tissues are within normal limits.  Scattered frothy secretions present within the bilateral frontal sinuses, right  greater than left. Visualized paranasal sinuses are otherwise clear. Minimal  opacity within the posterior inferior right mastoid air cells. Left mastoid air cells clear. Middle ear cavities clear.  Scalp soft tissues are unremarkable.  IMPRESSION: 1. Negative head CT with no acute intracranial process identified. 2. Mild frontal sinus disease. 3. Small right mastoid effusion.   Electronically Signed   By: Jeannine Boga M.D.   On: 05/14/2015 00:05    EKG:   Orders placed or performed in visit on 09/18/14  . EKG 12-Lead    ASSESSMENT AND PLAN:   49 year old female with history of depression, hypertension, Bell's palsy is admitted to behavioral medicine for treatment of depression. However readmitted to medical floor for hypertensive urgency.  #1 hypertensive urgency-with headache and neurological symptoms. -No history of hypertension but not taking any medications at home. -On metoprolol and by mouth hydralazine. Much improved blood pressures today. -Continue IV hydralazine when necessary for now.  #2 abdominal pain-likely has gastritis with history of gastric ulcers -Takes BC powders at home. -Patient says she cannot tolerate Protonix. We'll change it to Nexium nonformulary if available. -Continue Carafate and pain medications as needed. -Advance her diet today   #3 acute renal failure-likely dehydration, prerenal. -IV fluids -Improving. Once her oral intake improves, can discontinue IV fluids   #4 depression-patient will be discharged back to behavioral medicine unit tomorrow likely -Appreciate psych consult  #5 tobacco use-  nicotine patch will be started.  #6 hyperlipidemia-increase her statin dose  #7 hypokalemia- replaced  #8 DVT prophylaxis-subcutaneous heparin  All the records are reviewed and case discussed with Care Management/Social Workerr. Management plans discussed with the patient, family and they are in agreement.  CODE STATUS: Full code  TOTAL TIME  TAKING CARE OF THIS PATIENT: 36  minutes.   POSSIBLE D/C TOMORROW, DEPENDING ON CLINICAL CONDITION.   KALISETTI,RADHIKA M.D on 05/15/2015 at 1:50 PM  Between 7am to 6pm - Pager - [phone]  After 6pm go to [email or web address] - password [email or web address] [facility]  [facility]  Office  [phone]  CC: Primary care physician; No PCP Per Patient

## 2015-05-15 NOTE — Progress Notes (Signed)
Patient alert and oriented x4, no complaints at this time. vss at this time. Patient NSR on telemetry. Will continue to assess. Kanoa Phillippi R Mansfield  

## 2015-05-15 NOTE — Care Management (Signed)
Anticipate discharge back to beh med unit 9/28 if blood pressure remains stable

## 2015-05-15 NOTE — Plan of Care (Signed)
Problem: Consults Goal: General Medical Patient Education See Patient Education Module for specific education.  Outcome: Completed/Met Date Met:  05/15/15 Hypertension handout given  Problem: Phase I Progression Outcomes Goal: OOB as tolerated unless otherwise ordered Outcome: Completed/Met Date Met:  05/15/15 Up to bedside commode with standby assistance.

## 2015-05-16 ENCOUNTER — Inpatient Hospital Stay
Admit: 2015-05-16 | Discharge: 2015-05-18 | DRG: 885 | Disposition: A | Discharge status: Home / Self Care | Payer: No Typology Code available for payment source | Attending: Psychiatry | Admitting: Psychiatry

## 2015-05-16 ENCOUNTER — Encounter: Payer: Self-pay | Admitting: Psychiatry

## 2015-05-16 DIAGNOSIS — Z79899 Other long term (current) drug therapy: Secondary | ICD-10-CM | POA: Diagnosis not present

## 2015-05-16 DIAGNOSIS — G510 Bell's palsy: Secondary | ICD-10-CM | POA: Diagnosis present

## 2015-05-16 DIAGNOSIS — E785 Hyperlipidemia, unspecified: Secondary | ICD-10-CM | POA: Diagnosis present

## 2015-05-16 DIAGNOSIS — Z809 Family history of malignant neoplasm, unspecified: Secondary | ICD-10-CM

## 2015-05-16 DIAGNOSIS — Z90710 Acquired absence of both cervix and uterus: Secondary | ICD-10-CM | POA: Diagnosis not present

## 2015-05-16 DIAGNOSIS — F332 Major depressive disorder, recurrent severe without psychotic features: Principal | ICD-10-CM | POA: Diagnosis present

## 2015-05-16 DIAGNOSIS — Z833 Family history of diabetes mellitus: Secondary | ICD-10-CM

## 2015-05-16 DIAGNOSIS — K2900 Acute gastritis without bleeding: Secondary | ICD-10-CM

## 2015-05-16 DIAGNOSIS — G4700 Insomnia, unspecified: Secondary | ICD-10-CM | POA: Diagnosis present

## 2015-05-16 DIAGNOSIS — Z888 Allergy status to other drugs, medicaments and biological substances status: Secondary | ICD-10-CM | POA: Diagnosis not present

## 2015-05-16 DIAGNOSIS — Z630 Problems in relationship with spouse or partner: Secondary | ICD-10-CM | POA: Diagnosis not present

## 2015-05-16 DIAGNOSIS — K219 Gastro-esophageal reflux disease without esophagitis: Secondary | ICD-10-CM | POA: Diagnosis present

## 2015-05-16 DIAGNOSIS — F17200 Nicotine dependence, unspecified, uncomplicated: Secondary | ICD-10-CM | POA: Diagnosis present

## 2015-05-16 DIAGNOSIS — I10 Essential (primary) hypertension: Secondary | ICD-10-CM | POA: Diagnosis present

## 2015-05-16 DIAGNOSIS — F1220 Cannabis dependence, uncomplicated: Secondary | ICD-10-CM | POA: Diagnosis present

## 2015-05-16 DIAGNOSIS — F17210 Nicotine dependence, cigarettes, uncomplicated: Secondary | ICD-10-CM | POA: Diagnosis present

## 2015-05-16 DIAGNOSIS — F333 Major depressive disorder, recurrent, severe with psychotic symptoms: Secondary | ICD-10-CM | POA: Insufficient documentation

## 2015-05-16 DIAGNOSIS — Z818 Family history of other mental and behavioral disorders: Secondary | ICD-10-CM | POA: Diagnosis not present

## 2015-05-16 DIAGNOSIS — R45851 Suicidal ideations: Secondary | ICD-10-CM | POA: Diagnosis present

## 2015-05-16 MED ORDER — MIRTAZAPINE 15 MG PO TABS
15.0000 mg | ORAL_TABLET | Freq: Every day | ORAL | Status: DC
  Administered 2015-05-16 – 2015-05-17 (×2): 15 mg via ORAL
  Filled 2015-05-16 (×2): qty 1

## 2015-05-16 MED ORDER — NICOTINE 21 MG/24HR TD PT24: 21.0000 mg | MEDICATED_PATCH | Freq: Every day | TRANSDERMAL | Status: DC

## 2015-05-16 MED ORDER — SUCRALFATE 1 G PO TABS
1.0000 g | ORAL_TABLET | Freq: Three times a day (TID) | ORAL | Status: DC
  Administered 2015-05-16 – 2015-05-18 (×6): 1 g via ORAL
  Filled 2015-05-16 (×13): qty 1

## 2015-05-16 MED ORDER — OMEPRAZOLE 20 MG PO CPDR
20.0000 mg | DELAYED_RELEASE_CAPSULE | Freq: Two times a day (BID) | ORAL | Status: DC
  Administered 2015-05-16 – 2015-05-18 (×3): 20 mg via ORAL
  Filled 2015-05-16 (×8): qty 1

## 2015-05-16 MED ORDER — ZOLPIDEM TARTRATE 5 MG PO TABS: 5.0000 mg | ORAL_TABLET | Freq: Every evening | ORAL | Status: DC | PRN

## 2015-05-16 MED ORDER — SUCRALFATE 1 G PO TABS: 1.0000 g | ORAL_TABLET | Freq: Three times a day (TID) | ORAL | Status: DC

## 2015-05-16 MED ORDER — DIPHENHYDRAMINE HCL 50 MG PO CAPS: 50.0000 mg | ORAL_CAPSULE | Freq: Every evening | ORAL | Status: DC | PRN

## 2015-05-16 MED ORDER — MAGNESIUM HYDROXIDE 400 MG/5ML PO SUSP: 30.0000 mL | Freq: Every day | ORAL | Status: DC | PRN

## 2015-05-16 MED ORDER — NICOTINE 21 MG/24HR TD PT24
21.0000 mg | MEDICATED_PATCH | Freq: Every day | TRANSDERMAL | Status: DC
  Administered 2015-05-17: 21 mg via TRANSDERMAL
  Filled 2015-05-16 (×2): qty 1

## 2015-05-16 MED ORDER — DIPHENHYDRAMINE HCL 25 MG PO CAPS
50.0000 mg | ORAL_CAPSULE | Freq: Every evening | ORAL | Status: DC | PRN
Start: 2015-05-16 — End: 2015-05-16
  Administered 2015-05-16: 50 mg via ORAL
  Filled 2015-05-16: qty 2

## 2015-05-16 MED ORDER — OMEPRAZOLE 20 MG PO CPDR: 20.0000 mg | DELAYED_RELEASE_CAPSULE | Freq: Two times a day (BID) | ORAL | Status: DC

## 2015-05-16 MED ORDER — METOPROLOL TARTRATE 25 MG PO TABS
50.0000 mg | ORAL_TABLET | Freq: Two times a day (BID) | ORAL | Status: DC
  Administered 2015-05-16 – 2015-05-18 (×4): 50 mg via ORAL
  Filled 2015-05-16 (×4): qty 2

## 2015-05-16 MED ORDER — NON FORMULARY
20.0000 mg | Freq: Two times a day (BID) | Status: DC
Start: 2015-05-16 — End: 2015-05-16

## 2015-05-16 MED ORDER — ZOLPIDEM TARTRATE 5 MG PO TABS
5.0000 mg | ORAL_TABLET | Freq: Every evening | ORAL | Status: DC | PRN
  Administered 2015-05-16: 5 mg via ORAL
  Filled 2015-05-16: qty 1

## 2015-05-16 MED ORDER — ATORVASTATIN CALCIUM 40 MG PO TABS: 40.0000 mg | ORAL_TABLET | Freq: Every day | ORAL | Status: DC

## 2015-05-16 MED ORDER — ATORVASTATIN CALCIUM 20 MG PO TABS
40.0000 mg | ORAL_TABLET | Freq: Every day | ORAL | Status: DC
  Administered 2015-05-17: 40 mg via ORAL
  Filled 2015-05-16: qty 2

## 2015-05-16 MED ORDER — METOPROLOL TARTRATE 50 MG PO TABS: 50.0000 mg | ORAL_TABLET | Freq: Two times a day (BID) | ORAL | Status: DC

## 2015-05-16 MED ORDER — ALUM & MAG HYDROXIDE-SIMETH 200-200-20 MG/5ML PO SUSP: 30.0000 mL | ORAL | Status: DC | PRN

## 2015-05-16 NOTE — Discharge Summary (Signed)
[facility] at [place] NAME: [name]    MR#:  [id number]  DATE OF BIRTH:  1966-02-27  DATE OF ADMISSION:  05/13/2015 ADMITTING PHYSICIAN: Lytle Butte, MD  DATE OF DISCHARGE: 05/16/2015  PRIMARY CARE PHYSICIAN: No PCP Per Patient    ADMISSION DIAGNOSIS:  Htn  DISCHARGE DIAGNOSIS:  Principal Problem:   Hypertensive urgency Active Problems:   Facial droop   Acute gastritis   SECONDARY DIAGNOSIS:   Past Medical History  Diagnosis Date  . Chronic headaches   . Hernia   . Hypertension   . Sinusitis   . Bell palsy   . Pancreatitis, acute   . Wrist fracture     HOSPITAL COURSE:   49 year old female with history of depression, hypertension, Bell's palsy is admitted to behavioral medicine for treatment of depression. However readmitted to medical floor for hypertensive urgency.  #1 Hypertensive urgency-resolved now. -Discontinue oral hydralazine as blood pressure on low normal side. Discharge on metoprolol twice a day.  #2 acute gastritis-with history of gastric ulcers -Takes BC powders at home. -Allergic to Protonix, started on Prilosec twice a day. Continue that even at discharge. -Continue Carafate and pain medications as needed. -Tolerating soft diet well.  #3 Acute renal failure-likely dehydration, prerenal. -Improved with IV fluids. Normalized kidney function. Discontinue IV fluids today. -Encouraged oral hydration.  #4 depression-patient will be discharged back to behavioral medicine unit today. -Appreciate psych consult  #5 tobacco use- nicotine patch restarted.  #6 hyperlipidemia-increased her statin dose  #7 hypokalemia- replaced  Patient is medically stable, will be discharged to behavioral medicine unit today.  DISCHARGE CONDITIONS:   Stable  CONSULTS OBTAINED:  Treatment Team:  Gonzella Lex, MD  DRUG ALLERGIES:   Allergies  Allergen Reactions  . Pantoprazole Sodium Other (See  Comments)    Makes patient feel sick all over (per patient who spoke with MD)    DISCHARGE MEDICATIONS:   Current Discharge Medication List    START taking these medications   Details  atorvastatin (LIPITOR) 40 MG tablet Take 1 tablet (40 mg total) by mouth daily at 6 PM. Qty: 30 tablet, Refills: 2    diphenhydrAMINE (BENADRYL) 50 MG capsule Take 1 capsule (50 mg total) by mouth at bedtime as needed for sleep. Qty: 30 capsule, Refills: 0    metoprolol (LOPRESSOR) 50 MG tablet Take 1 tablet (50 mg total) by mouth 2 (two) times daily. Qty: 60 tablet, Refills: 2    nicotine (NICODERM CQ - DOSED IN MG/24 HOURS) 21 mg/24hr patch Place 1 patch (21 mg total) onto the skin daily. Qty: 28 patch, Refills: 0    omeprazole (PRILOSEC) 20 MG capsule Take 1 capsule (20 mg total) by mouth 2 (two) times daily before a meal. Qty: 60 capsule, Refills: 2    sucralfate (CARAFATE) 1 G tablet Take 1 tablet (1 g total) by mouth 4 (four) times daily -  with meals and at bedtime. Qty: 90 tablet, Refills: 2    zolpidem (AMBIEN) 5 MG tablet Take 1 tablet (5 mg total) by mouth at bedtime as needed for sleep. Qty: 30 tablet, Refills: 0         DISCHARGE INSTRUCTIONS:   Patient will be discharged to behavioral medicine unit  If you experience worsening of your admission symptoms, develop shortness of breath, life threatening emergency, suicidal or homicidal thoughts you must seek medical attention immediately by calling 911 or calling your MD immediately  if symptoms less  severe.  You Must read complete instructions/literature along with all the possible adverse reactions/side effects for all the Medicines you take and that have been prescribed to you. Take any new Medicines after you have completely understood and accept all the possible adverse reactions/side effects.   Please note  You were cared for by a hospitalist during your hospital stay. If you have any questions about your discharge  medications or the care you received while you were in the hospital after you are discharged, you can call the unit and asked to speak with the hospitalist on call if the hospitalist that took care of you is not available. Once you are discharged, your primary care physician will handle any further medical issues. Please note that NO REFILLS for any discharge medications will be authorized once you are discharged, as it is imperative that you return to your primary care physician (or establish a relationship with a primary care physician if you do not have one) for your aftercare needs so that they can reassess your need for medications and monitor your lab values.    Today   CHIEF COMPLAINT:  No chief complaint on file.   VITAL SIGNS:  Blood pressure 128/72, pulse 59, temperature 98.1 F (36.7 C), temperature source Oral, resp. rate 20, height 5\' 6"  (1.676 m), weight 50.894 kg (112 lb 3.2 oz), SpO2 96 %.  I/O:   Intake/Output Summary (Last 24 hours) at 05/16/15 0938 Last data filed at 05/16/15 0523  Gross per 24 hour  Intake 4446.67 ml  Output   1050 ml  Net 3396.67 ml    PHYSICAL EXAMINATION:   Physical Exam  GENERAL:  49 y.o.-year-old patient lying in the bed with no acute distress.  EYES: Pupils equal, round, reactive to light and accommodation. No scleral icterus. Extraocular muscles intact.  HEENT: Head atraumatic, normocephalic. Oropharynx and nasopharynx clear.  NECK:  Supple, no jugular venous distention. No thyroid enlargement, no tenderness.  LUNGS: Normal breath sounds bilaterally, no wheezing, rales,rhonchi or crepitation. No use of accessory muscles of respiration.  CARDIOVASCULAR: S1, S2 normal. No murmurs, rubs, or gallops.  ABDOMEN: Soft, non-tender, non-distended. Bowel sounds present. No organomegaly or mass.  EXTREMITIES: No pedal edema, cyanosis, or clubbing.  NEUROLOGIC: Cranial nerves II through XII are intact. Muscle strength 5/5 in all extremities.  Sensation intact. Gait not checked.  PSYCHIATRIC: The patient is alert and oriented x 3. No suicidal ideation. Mood is good SKIN: No obvious rash, lesion, or ulcer.   DATA REVIEW:   CBC  Recent Labs Lab 05/14/15 0438  WBC 10.4  HGB 17.3*  HCT 50.7*  PLT 201    Chemistries   Recent Labs Lab 05/14/15 0438 05/15/15 0750  NA 135 135  K 3.5 3.8  CL 94* 104  CO2 24 25  GLUCOSE 215* 112*  BUN 52* 35*  CREATININE 1.44* 0.67  CALCIUM 9.8 8.8*  AST 27  --   ALT 14  --   ALKPHOS 55  --   BILITOT 1.3*  --     Cardiac Enzymes No results for input(s): TROPONINI in the last 168 hours.  Microbiology Results  Results for orders placed or performed during the hospital encounter of 08/17/10  Culture, routine-abscess     Status: None   Collection Time: 08/17/10  2:30 PM  Result Value Ref Range Status   Specimen Description ABSCESS PELVIS  Final   Special Requests PATIENT ON FOLLOWING CLEOCIN,RECEPHIN  Final   Gram Stain   Final  ABUNDANT WBC PRESENT,BOTH PMN AND MONONUCLEAR NO SQUAMOUS EPITHELIAL CELLS SEEN FEW GRAM NEGATIVE RODS   Culture MODERATE ESCHERICHIA COLI  Final   Report Status 08/20/2010 FINAL  Final   Organism ID, Bacteria ESCHERICHIA COLI  Final      Susceptibility   Escherichia coli - MIC    AMPICILLIN >=32 Resistant     AMPICILLIN/SULBACTAM 16 Intermediate     CEFAZOLIN <=4 Sensitive     CEFEPIME <=1 Sensitive     CEFTAZIDIME <=1 Sensitive     CEFTRIAXONE <=1 Sensitive     CIPROFLOXACIN >=4 Resistant     GENTAMICIN <=1 Sensitive     IMIPENEM <=1 Sensitive     TOBRAMYCIN <=1 Sensitive     TRIMETH/SULFA <=20 Sensitive     CEFOXITIN <=4 Sensitive     RADIOLOGY:  No results found.  EKG:   Orders placed or performed in visit on 09/18/14  . EKG 12-Lead      Management plans discussed with the patient, family and they are in agreement.  CODE STATUS:     Code Status Orders        Start     Ordered   05/13/15 2300  Full code   Continuous      05/13/15 2301      TOTAL TIME TAKING CARE OF THIS PATIENT: 36 minutes.    Gladstone Lighter M.D on 05/16/2015 at 9:38 AM  Between 7am to 6pm - Pager - [phone]  After 6pm go to [email or web address] - password [email or web address]  [facility]  Office  [phone]  CC: Primary care physician; No PCP Per Patient

## 2015-05-16 NOTE — Progress Notes (Signed)
Pharmacy reports Prilosec has been ordered and that pharmacy only carries Protonix (patient is allergic). Patient states "she may have a family member bring her some Prilosec.

## 2015-05-16 NOTE — Tx Team (Signed)
Initial Interdisciplinary Treatment Plan   PATIENT STRESSORS: Financial difficulties Marital or family conflict Traumatic event   PATIENT STRENGTHS: Capable of independent living Communication skills Supportive family/friends   PROBLEM LIST: Problem List/Patient Goals Date to be addressed Date deferred Reason deferred Estimated date of resolution  Depression  05/16/15           Anxiety  05/16/15                                          DISCHARGE CRITERIA:  Adequate post-discharge living arrangements Improved stabilization in mood, thinking, and/or behavior  PRELIMINARY DISCHARGE PLAN: Outpatient therapy Return to previous living arrangement  PATIENT/FAMIILY INVOLVEMENT: This treatment plan has been presented to and reviewed with the patient, [name], and/or family member, .  The patient and family have been given the opportunity to ask questions and make suggestions.  Raul Del 05/16/2015, 5:04 PM

## 2015-05-16 NOTE — BHH Counselor (Signed)
Pt. is to be admitted to [facility] by Dr. Bary Leriche. Attending Physician will be Dr. Bary Leriche.  Pt. has been assigned to room 312, by [place].  Intake Paper Work has been Visual merchandiser and given to Camera operator. Pt RN (Brandy) and Pt Access have been made aware of admission.

## 2015-05-16 NOTE — Progress Notes (Signed)
Patient with sad affect and cooperative behavior with admission interview and assessment. Denies SI/HI at this time. Patient able to verbalize her stressors as loss of her 49 year old son by suicide and marital stress. Patient reports she has good support from her Mom,Cousin  and some friends. Patient wearing right arm brace rt recent fall at a swimming pool. Small bruise to right wrist from brace noted. No wounds or further bruises noted during skin check. No contraband found during skin check or belonging check. Patient to dayroom to sit with other patients. Safety maintained.

## 2015-05-16 NOTE — Plan of Care (Signed)
Problem: Alteration in mood Goal: LTG-Patient reports reduction in suicidal thoughts (Patient reports reduction in suicidal thoughts and is able to verbalize a safety plan for whenever patient is feeling suicidal)  Outcome: Progressing Denies SI at this time     

## 2015-05-16 NOTE — Progress Notes (Signed)
Patient d/c'd to behavioral health. Report called to [facility]. Education provided, no questions at this time. Telemetry removed. Wilnette Kales

## 2015-05-17 DIAGNOSIS — F333 Major depressive disorder, recurrent, severe with psychotic symptoms: Secondary | ICD-10-CM

## 2015-05-17 MED ORDER — OMEPRAZOLE 20 MG PO CPDR: 20.0000 mg | DELAYED_RELEASE_CAPSULE | Freq: Two times a day (BID) | ORAL | Status: DC

## 2015-05-17 MED ORDER — MIRTAZAPINE 15 MG PO TABS: 15.0000 mg | ORAL_TABLET | Freq: Every day | ORAL | Status: DC

## 2015-05-17 MED ORDER — TRAZODONE HCL 100 MG PO TABS: 100.0000 mg | ORAL_TABLET | Freq: Every day | ORAL | Status: DC

## 2015-05-17 MED ORDER — ATORVASTATIN CALCIUM 40 MG PO TABS: 40.0000 mg | ORAL_TABLET | Freq: Every day | ORAL | Status: DC

## 2015-05-17 MED ORDER — TRAZODONE HCL 100 MG PO TABS
100.0000 mg | ORAL_TABLET | Freq: Every day | ORAL | Status: DC
  Administered 2015-05-17: 100 mg via ORAL
  Filled 2015-05-17: qty 1

## 2015-05-17 MED ORDER — METOPROLOL TARTRATE 50 MG PO TABS: 50.0000 mg | ORAL_TABLET | Freq: Two times a day (BID) | ORAL | Status: DC

## 2015-05-17 MED ORDER — DIPHENHYDRAMINE HCL 25 MG PO CAPS
50.0000 mg | ORAL_CAPSULE | Freq: Four times a day (QID) | ORAL | Status: DC | PRN
  Administered 2015-05-17 – 2015-05-18 (×3): 50 mg via ORAL
  Filled 2015-05-17 (×3): qty 2

## 2015-05-17 MED ORDER — SUCRALFATE 1 G PO TABS: 1.0000 g | ORAL_TABLET | Freq: Three times a day (TID) | ORAL | Status: DC

## 2015-05-17 MED ORDER — TRAZODONE HCL 100 MG PO TABS
100.0000 mg | ORAL_TABLET | Freq: Every day | ORAL | Status: DC
Start: 2015-05-17 — End: 2015-05-17

## 2015-05-17 NOTE — Progress Notes (Signed)
Patient with sad affect, cooperative behavior with meals, meds and plan of care. No SI/HI at this time. Attends therapy groups. Good adls, good appetite. Safety maintained.

## 2015-05-17 NOTE — H&P (Addendum)
Psychiatric Admission Assessment Adult  Patient Identification: [name] MRN:  [id number] Date of Evaluation:  05/17/2015 Chief Complaint:  Depression Principal Diagnosis: Major depressive disorder, recurrent, severe with psychotic features Diagnosis:   Patient Active Problem List   Diagnosis Date Noted  . Acute gastritis [K29.00] 05/16/2015  . 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 Hypertensive urgency [I10] 05/13/2015  . Facial droop [R29.810] 05/13/2015  . Tobacco use disorder [Z72.0] 05/10/2015  . Cannabis use disorder, moderate, dependence [F12.20] 05/10/2015  . Major depressive disorder, recurrent, severe with psychotic features [F33.3] 05/09/2015  . 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 [name] is a 49 year old female with history of depression.  Chief complaint. "I feel so much better."  History of present illness.  Information was obtained from the patient and the chart.  The patient was admitted to psychiatry week or so ago for worsening of depression and suicidal ideation in the context of major loss and marital problems. Her son committed suicide in January. While on the unit she developed gastrointestinal symptoms and elevated blood pressure was transferred to medical floor. She was diagnosed with accelerated hypertension and gastritis. She was transferred back to psychiatry to treat depression once medically stable. The patient reports much improvement in her mood. She no longer cries all the time. She was able to have a conversation on the phone with her husband although it was noted friendly one. She spoke with our chaplain he felt much support at [facility]. She denies suicidal ideation or excessive anxiety. There are no psychotic symptoms. There are no symptoms suggestive of bipolar mania. No alcohol or substances are involved  Past psychiatric history. 20 or so years  ago she had depressive episode. She does not remember medications given. She only took it briefly. She denies ever attempting suicide.   Family psychiatric history. Sister with depression and anxiety. Her son suffered depression and committed suicide.  Social history. She has been married for 17 years. She stated home as the husband was very controlling. He now asked her to leave. She has no income or insurance. She stays with her mother. She has no chance of employment as her right wrist is broken and required surgery. It will be no sooner than December that she will be treated at [facility].  Total Time spent with patient: 1 hour  Past Psychiatric History:   Risk to Self: Is patient at risk for suicide?: No (denies at this time ) Risk to Others:   Prior Inpatient Therapy:   Prior Outpatient Therapy:    Alcohol Screening: 1. How often do you have a drink containing alcohol?: Never 9. Have you or someone else been injured as a result of your drinking?: No 10. Has a relative or friend or a doctor or another health worker been concerned about your drinking or suggested you cut down?: No Alcohol Use Disorder Identification Test Final Score (AUDIT): 0 Brief Intervention: AUDIT score less than 7 or less-screening does not suggest unhealthy drinking-brief intervention not indicated Substance Abuse History in the last 12 months:  No. Consequences of Substance Abuse: NA Previous Psychotropic Medications: No  Psychological Evaluations: Yes  Past Medical History:  Past Medical History  Diagnosis Date  . Chronic headaches   . Hernia   . Hypertension   . Sinusitis   . Bell palsy   . Pancreatitis, acute   . Wrist fracture     Past Surgical History  Procedure  Laterality Date  . Cesarean section    . Abdominal hysterectomy     Family History:  Family History  Problem Relation Age of Onset  . Cancer Father   . Diabetes Other    Family Psychiatric  History: Her son committed suicide. Social  History:  History  Alcohol Use No     History  Drug Use  . Yes  . Special: Marijuana    Social History   Social History  . Marital Status: Legally Separated    Spouse Name: N/A  . Number of Children: N/A  . Years of Education: N/A   Social History Main Topics  . Smoking status: Current Every Day Smoker -- 1.00 packs/day for 20 years    Types: Cigarettes  . Smokeless tobacco: Never Used  . Alcohol Use: No  . Drug Use: Yes    Special: Marijuana  . Sexual Activity: No   Other Topics Concern  . None   Social History Narrative   Additional Social History:    Pain Medications: None Reported Prescriptions: None Reported Over the Counter: None Reported Longest period of sobriety (when/how long): N/A (na) Negative Consequences of Use:  (na) Withdrawal Symptoms:  (na)                    Allergies:   Allergies  Allergen Reactions  . Pantoprazole Sodium Other (See Comments)    Makes patient feel sick all over (per patient who spoke with MD)   Lab Results: No results found for this or any previous visit (from the past 44 hour(s)).  Metabolic Disorder Labs:  Lab Results  Component Value Date   HGBA1C 5.4 05/14/2015   Lab Results  Component Value Date   PROLACTIN  06/17/2010    5.2 (NOTE)     Reference Ranges:                 Female:                       2.1 -  17.1 ng/ml                 Female:   Pregnant          9.7 - 208.5 ng/mL                           Non Pregnant      2.8 -  29.2 ng/mL                           Post  Menopausal   1.8 -  20.3 ng/mL                     Lab Results  Component Value Date   CHOL 341* 05/14/2015   TRIG 349* 05/14/2015   HDL 30* 05/14/2015   CHOLHDL 11.4 05/14/2015   VLDL 70* 05/14/2015   LDLCALC 241* 05/14/2015    Current Medications: Current Facility-Administered Medications  Medication Dose Route Frequency Provider Last Rate Last Dose  . alum & mag hydroxide-simeth (MAALOX/MYLANTA) 200-200-20 MG/5ML suspension 30  mL  30 mL Oral Q4H PRN Rishab Stoudt B Alleyah Twombly, MD      . atorvastatin (LIPITOR) tablet 40 mg  40 mg Oral q1800 Larrell Rapozo B Nicollette Wilhelmi, MD      . diphenhydrAMINE (BENADRYL) capsule 50 mg  50 mg Oral Q6H PRN Harley Mccartney B Alejandrina Raimer,  MD   50 mg at 05/17/15 1153  . magnesium hydroxide (MILK OF MAGNESIA) suspension 30 mL  30 mL Oral Daily PRN Terry Abila B Susann Lawhorne, MD      . metoprolol tartrate (LOPRESSOR) tablet 50 mg  50 mg Oral BID Clovis Fredrickson, MD   50 mg at 05/17/15 0849  . mirtazapine (REMERON) tablet 15 mg  15 mg Oral QHS Clovis Fredrickson, MD   15 mg at 05/16/15 2130  . nicotine (NICODERM CQ - dosed in mg/24 hours) patch 21 mg  21 mg Transdermal Daily Kerington Hildebrant B Camrin Lapre, MD   21 mg at 05/17/15 0850  . omeprazole (PRILOSEC) capsule 20 mg  20 mg Oral BID Clovis Fredrickson, MD   20 mg at 05/16/15 2155  . sucralfate (CARAFATE) tablet 1 g  1 g Oral TID WC & HS Younique Casad B Alexcia Schools, MD   1 g at 05/16/15 2155  . zolpidem (AMBIEN) tablet 5 mg  5 mg Oral QHS PRN Clovis Fredrickson, MD   5 mg at 05/16/15 2158   PTA Medications: Prescriptions prior to admission  Medication Sig Dispense Refill Last Dose  . atorvastatin (LIPITOR) 40 MG tablet Take 1 tablet (40 mg total) by mouth daily at 6 PM. 30 tablet 2   . diphenhydrAMINE (BENADRYL) 50 MG capsule Take 1 capsule (50 mg total) by mouth at bedtime as needed for sleep. 30 capsule 0   . metoprolol (LOPRESSOR) 50 MG tablet Take 1 tablet (50 mg total) by mouth 2 (two) times daily. 60 tablet 2   . nicotine (NICODERM CQ - DOSED IN MG/24 HOURS) 21 mg/24hr patch Place 1 patch (21 mg total) onto the skin daily. 28 patch 0   . omeprazole (PRILOSEC) 20 MG capsule Take 1 capsule (20 mg total) by mouth 2 (two) times daily before a meal. 60 capsule 2   . sucralfate (CARAFATE) 1 G tablet Take 1 tablet (1 g total) by mouth 4 (four) times daily -  with meals and at bedtime. 90 tablet 2   . zolpidem (AMBIEN) 5 MG tablet Take 1 tablet (5 mg total) by mouth at  bedtime as needed for sleep. 30 tablet 0     Musculoskeletal: Strength & Muscle Tone: within normal limits Gait & Station: normal Patient leans: N/A  Psychiatric Specialty Exam: Physical Exam  Nursing note and vitals reviewed.   Review of Systems  Neurological: Positive for headaches.    Blood pressure 136/96, pulse 65, temperature 98.3 F (36.8 C), temperature source Oral, resp. rate 18, height 5\' 6"  (1.676 m), weight 54.432 kg (120 lb), SpO2 100 %.Body mass index is 19.38 kg/(m^2).  See SRA.                                                  Sleep:  Number of Hours: 6     Treatment Plan Summary: Daily contact with patient to assess and evaluate symptoms and progress in treatment and Medication management   [name] is a 49 year old female with a history of depression admitted for suicidal ideation.  1. Suicidal ideation. This has resolved. The patient is able to contract for safety.  2. Mood. She is on Remeron for depression.  3. Hypertension. She is on metoprolol.  4. Dyslipidemia. She is on Lipitor.  5. Recent acute gastritis. We will continue Prilosec and Carafate.  6. Smoking. Nicotine patch is available.  7. Insomnia. She did not respond well to Ambien will offer trazodone.  8. Disposition. She will be discharged to home with her mother. She will follow up with [facility].    Observation Level/Precautions:  15 minute checks  Laboratory:  CBC Chemistry Profile UDS UA  Psychotherapy:    Medications:    Consultations:    Discharge Concerns:    Estimated LOS:  Other:     I certify that inpatient services furnished can reasonably be expected to improve the patient's condition.   Mardell Suttles 9/29/20161:32 PM

## 2015-05-17 NOTE — BHH Group Notes (Signed)
[place] Group Notes:  (Nursing/MHT/Case Management/Adjunct)  Date:  05/17/2015  Time:  9:28 PM  Type of Therapy:  Group Therapy    Participation Level:  Active  Participation Quality:  Appropriate  Affect:  Appropriate  Cognitive:  Appropriate  Insight:  Appropriate  Engagement in Group:  Engaged  Modes of Intervention:  Discussion  Summary of Progress/Problems:  [name] 05/17/2015, 9:28 PM

## 2015-05-17 NOTE — Plan of Care (Signed)
Problem: Consults Goal: Suicide Risk Patient Education (See Patient Education module for education specifics)  Outcome: Progressing Patient meets with pastor today for 1:1.

## 2015-05-17 NOTE — Progress Notes (Signed)
Patient with sad affect, cooperative behavior with meals, meds and plan of care. Good adls and good appetite. No SI/HI at this time. Encouraged to attend therapy groups to learn and initiate coping skills for management of stressors and diagnosis. Safety maintained. Meet with pastor for 1:1 today.

## 2015-05-17 NOTE — Progress Notes (Signed)
Calm and cooperative. Pt rates her depression level as 5. Med compliant. Attends group. No voiced thoughts of hurttng herself. No c/o pain/discomfort noted.

## 2015-05-17 NOTE — Plan of Care (Signed)
Problem: Diagnosis: Increased Risk For Suicide Attempt Goal: STG-Patient Will Report Suicidal Feelings to Staff Outcome: Not Met (add Reason) Calm and cooperative. No voiced thoughts of hurting herself. No injuries noted. q 15 min checks maintained for safety.

## 2015-05-17 NOTE — BHH Group Notes (Signed)
[place] Group Notes:  (Nursing/MHT/Case Management/Adjunct)  Date:  05/17/2015  Time:  1:36 PM  Type of Therapy:  Psychoeducational Skills  Participation Level:  Active  Participation Quality:  Appropriate  Affect:  Appropriate  Cognitive:  Appropriate  Insight:  Appropriate  Engagement in Group:  Engaged  Modes of Intervention:  Discussion, Education and Support  Summary of Progress/Problems:  [name] 05/17/2015, 1:36 PM

## 2015-05-17 NOTE — Progress Notes (Signed)
   05/17/15 1300  Clinical Encounter Type  Visited With Patient  Visit Type Initial  Referral From Nurse  Consult/Referral To Chaplain  Patient declined chaplain visit.  Said she is "doing fine now".  Pt also said she would call if she needs [place].  [place] [phone]

## 2015-05-17 NOTE — Progress Notes (Signed)
Patient with depressed affect and cooperative behavior with meals, meds and plan of care. No SI/HI at this time. Patient with good adls, fair appetite. Encouraged to attend therapy groups to learn and initiate coping skills for management of stressors and diagnosis. Safety maintained.

## 2015-05-17 NOTE — BHH Suicide Risk Assessment (Signed)
[facility] Admission Suicide Risk Assessment   Nursing information obtained from:    Demographic factors:    Current Mental Status:    Loss Factors:    Historical Factors:    Risk Reduction Factors:    Total Time spent with patient: 1 hour Principal Problem: Major depressive disorder, recurrent, severe with psychotic features Diagnosis:   Patient Active Problem List   Diagnosis Date Noted  . Acute gastritis [K29.00] 05/16/2015  . 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 Hypertensive urgency [I10] 05/13/2015  . Facial droop [R29.810] 05/13/2015  . Tobacco use disorder [Z72.0] 05/10/2015  . Cannabis use disorder, moderate, dependence [F12.20] 05/10/2015  . Major depressive disorder, recurrent, severe with psychotic features [F33.3] 05/09/2015  . Complicated grief [X32.44] 05/09/2015     Continued Clinical Symptoms:  Alcohol Use Disorder Identification Test Final Score (AUDIT): 0 The "Alcohol Use Disorders Identification Test", Guidelines for Use in Primary Care, Second Edition.  World Pharmacologist [facility]). Score between 0-7:  no or low risk or alcohol related problems. Score between 8-15:  moderate risk of alcohol related problems. Score between 16-19:  high risk of alcohol related problems. Score 20 or above:  warrants further diagnostic evaluation for alcohol dependence and treatment.   CLINICAL FACTORS:   Depression:   Severe   Musculoskeletal: Strength & Muscle Tone: within normal limits Gait & Station: normal Patient leans: N/A  Psychiatric Specialty Exam: I reviewed physical exam performed on the medical floor and agree with the findings Physical Exam  Nursing note and vitals reviewed.   Review of Systems  Neurological: Positive for headaches.  All other systems reviewed and are negative.   Blood pressure 136/96, pulse 65, temperature 98.3 F (36.8 C), temperature source Oral, resp. rate 18, height 5\' 6"  (1.676 m),  weight 54.432 kg (120 lb), SpO2 100 %.Body mass index is 19.38 kg/(m^2).  General Appearance: Casual  Eye Contact::  Good  Speech:  Clear and Coherent  Volume:  Normal  Mood:  Depressed  Affect:  Flat  Thought Process:  Goal Directed  Orientation:  Full (Time, Place, and Person)  Thought Content:  WDL  Suicidal Thoughts:  No  Homicidal Thoughts:  No  Memory:  Immediate;   Fair Recent;   Fair Remote;   Fair  Judgement:  Fair  Insight:  Fair  Psychomotor Activity:  Normal  Concentration:  Fair  Recall:  [employer] [place]  Language: Fair  Akathisia:  No  Handed:  Right  AIMS (if indicated):     Assets:  Communication Skills Desire for Improvement Physical Health Resilience Social Support  Sleep:  Number of Hours: 6  Cognition: WNL  ADL's:  Intact     COGNITIVE FEATURES THAT CONTRIBUTE TO RISK:  None    SUICIDE RISK:   Minimal: No identifiable suicidal ideation.  Patients presenting with no risk factors but with morbid ruminations; may be classified as minimal risk based on the severity of the depressive symptoms  PLAN OF CARE: Hospital admission, medication management, discharge planning.  Medical Decision Making:  Established Problem, Stable/Improving (1), Review of Psycho-Social Stressors (1), Review or order clinical lab tests (1), Review of Medication Regimen & Side Effects (2) and Review of New Medication or Change in Dosage (2)   [name] is a 49 year old female with a history of depression admitted for suicidal ideation.  1. Suicidal ideation. This has resolved. The patient is able to contract for safety.  2. Mood. She is  on Remeron for depression.  3. Hypertension. She is on metoprolol.  4. Dyslipidemia. She is on Lipitor.  5. Recent acute gastritis. We will continue Prilosec and Carafate.  6. Smoking. Nicotine patch is available.  7. Insomnia. She did not respond well to Ambien will offer trazodone.  8. Disposition. She will be  discharged to home with her mother. She will follow up with [facility].   I certify that inpatient services furnished can reasonably be expected to improve the patient's condition.   [name] 05/17/2015, 1:27 PM

## 2015-05-18 DIAGNOSIS — F333 Major depressive disorder, recurrent, severe with psychotic symptoms: Secondary | ICD-10-CM | POA: Insufficient documentation

## 2015-05-18 MED ORDER — CITALOPRAM HYDROBROMIDE 20 MG PO TABS: 20.0000 mg | ORAL_TABLET | Freq: Every day | ORAL | Status: DC

## 2015-05-18 NOTE — BHH Suicide Risk Assessment (Signed)
[facility] INPATIENT:  Family/Significant Other Suicide Prevention Education  Suicide Prevention Education:  Education Completed; Zebedee Iba (mother) [phone] has been identified by the patient as the family member/significant other with whom the patient will be residing, and identified as the person(s) who will aid the patient in the event of a mental health crisis (suicidal ideations/suicide attempt).  With written consent from the patient, the family member/significant other has been provided the following suicide prevention education, prior to the and/or following the discharge of the patient.  The suicide prevention education provided includes the following:  Suicide risk factors  Suicide prevention and interventions  National Suicide Hotline telephone number  [facility] assessment telephone number  [facility] Emergency Assistance [place] and/or Residential Mobile Crisis Unit telephone number  Request made of family/significant other to:  Remove weapons (e.g., guns, rifles, knives), all items previously/currently identified as safety concern.    Remove drugs/medications (over-the-counter, prescriptions, illicit drugs), all items previously/currently identified as a safety concern.  The family member/significant other verbalizes understanding of the suicide prevention education information provided.  The family member/significant other agrees to remove the items of safety concern listed above.  Keene Breath, MSW, LCSWA 05/18/2015, 11:48 AM

## 2015-05-18 NOTE — Progress Notes (Signed)
D: Pt is awake and active in the milieu this evening. Pt mood is euthymic and her affect is appropriate. Pt denies SI/HI and AVH at this time. Pt is expressing gratitude for care received at time of transfer to medical floor. Pt states she is feeling much better and is looking forward to discharge.   A: Writer provided emotional support and administered medications as prescribed.  R: Pt behavior is appropriate on the unit and she is pleasant and cooperative with staff. Pt went to bed following medication administration. She did has some restlessness in her legs during the night, and her sleep is suffering somewhat.

## 2015-05-18 NOTE — Progress Notes (Signed)
  [facility] Adult Case Management Discharge Plan :  Will you be returning to the same living situation after discharge:  Yes,  home with mom At discharge, do you have transportation home?: Yes,  patient's mother will pick up at discharge Do you have the ability to pay for your medications: No. Patient is referred to [facility] [phone]  Release of information consent forms completed and in the chart;  Patient's signature needed at discharge.  Patient to Follow up at: Follow-up Information    Follow up with [facility]. Go on 05/29/2015.   Why:  For follow-up care appt Tuesday 05/29/15 at 2:00pm   Contact information:   [place], [place] Ph [phone] Fax [phone]      Follow up with [facility] - Dr. Jamse Arn. Go on 05/30/2015.   Why:  For follow-up care appt Wednesday 05/30/15 at 1:20pm   Contact information:   [place], [place] Fax [phone]      Patient denies SI/HI: Yes,  patient denies SI/HI    Safety Planning and Suicide Prevention discussed: Yes,  SPE discussed with patient and her mother Zebedee Iba [phone]  Have you used any form of tobacco in the last 30 days? (Cigarettes, Smokeless Tobacco, Cigars, and/or Pipes): Yes  Has patient been referred to the Quitline?: Patient refused referral  Keene Breath, MSW, LCSWA 05/18/2015, 11:51 AM

## 2015-05-18 NOTE — Discharge Summary (Signed)
Physician Discharge Summary Note  Patient:  [name] is an 49 y.o., female MRN:  [id number] DOB:  06-Oct-1965 Patient phone:  [phone] (home)  Patient address:   [place] [address],  Total Time spent with patient: 30 minutes  Date of Admission:  05/16/2015 Date of Discharge: 05/18/2015  Reason for Admission:  Suicidal ideation.  Identifying data. [name] is a 49 year old female with history of depression.  Chief complaint. "I feel so much better."  History of present illness. Information was obtained from the patient and the chart. The patient was admitted to psychiatry week or so ago for worsening of depression and suicidal ideation in the context of major loss and marital problems. Her son committed suicide in January. While on the unit she developed gastrointestinal symptoms and elevated blood pressure was transferred to medical floor. She was diagnosed with accelerated hypertension and gastritis. She was transferred back to psychiatry to treat depression once medically stable. The patient reports much improvement in her mood. She no longer cries all the time. She was able to have a conversation on the phone with her husband although it was noted friendly one. She spoke with our chaplain he felt much support at [facility]. She denies suicidal ideation or excessive anxiety. There are no psychotic symptoms. There are no symptoms suggestive of bipolar mania. No alcohol or substances are involved  Past psychiatric history. 20 or so years ago she had depressive episode. She does not remember medications given. She only took it briefly. She denies ever attempting suicide.   Family psychiatric history. Sister with depression and anxiety. Her son suffered depression and committed suicide.  Social history. She has been married for 17 years. She stated home as the husband was very controlling. He now asked her to leave. She has no income or insurance. She stays with her  mother. She has no chance of employment as her right wrist is broken and required surgery. It will be no sooner than December that she will be treated at [facility].  Principal Problem: Major depressive disorder, recurrent severe without psychotic features Discharge Diagnoses: Patient Active Problem List   Diagnosis Date Noted  . Acute gastritis [K29.00] 05/16/2015  . Hypertensive urgency [I10] 05/13/2015  . Facial droop [R29.810] 05/13/2015  . Tobacco use disorder [Z72.0] 05/10/2015  . Cannabis use disorder, moderate, dependence [F12.20] 05/10/2015  . Major depressive disorder, recurrent severe without psychotic features [F33.2] 05/09/2015  . Complicated grief [R67.89] 05/09/2015    Musculoskeletal: Strength & Muscle Tone: within normal limits Gait & Station: normal Patient leans: N/A  Psychiatric Specialty Exam: Physical Exam  Nursing note and vitals reviewed.   Review of Systems  All other systems reviewed and are negative.   Blood pressure 119/78, pulse 68, temperature 98.8 F (37.1 C), temperature source Oral, resp. rate 18, height 5\' 6"  (1.676 m), weight 54.432 kg (120 lb), SpO2 100 %.Body mass index is 19.38 kg/(m^2).  See SRA.                                                  Sleep:  Number of Hours: 5   Have you used any form of tobacco in the last 30 days? (Cigarettes, Smokeless Tobacco, Cigars, and/or Pipes): Yes  Has this patient used any form of tobacco in the last 30 days? (Cigarettes, Smokeless Tobacco,  Cigars, and/or Pipes) Yes, A prescription for an FDA-approved tobacco cessation medication was offered at discharge and the patient refused  Past Medical History:  Past Medical History  Diagnosis Date  . Chronic headaches   . Hernia   . Hypertension   . Sinusitis   . Bell palsy   . Pancreatitis, acute   . Wrist fracture     Past Surgical History  Procedure Laterality Date  . Cesarean section    . Abdominal hysterectomy     Family  History:  Family History  Problem Relation Age of Onset  . Cancer Father   . Diabetes Other    Social History:  History  Alcohol Use No     History  Drug Use  . Yes  . Special: Marijuana    Social History   Social History  . Marital Status: Legally Separated    Spouse Name: N/A  . Number of Children: N/A  . Years of Education: N/A   Social History Main Topics  . Smoking status: Current Every Day Smoker -- 1.00 packs/day for 20 years    Types: Cigarettes  . Smokeless tobacco: Never Used  . Alcohol Use: No  . Drug Use: Yes    Special: Marijuana  . Sexual Activity: No   Other Topics Concern  . None   Social History Narrative    Past Psychiatric History: Hospitalizations:  Outpatient Care:  Substance Abuse Care:  Self-Mutilation:  Suicidal Attempts:  Violent Behaviors:   Risk to Self: Is patient at risk for suicide?: No (denies at this time ) Risk to Others:   Prior Inpatient Therapy:   Prior Outpatient Therapy:    Level of Care:  OP  Hospital Course:    [name] is a 49 year old female with a history of depression admitted for suicidal ideation.  1. Suicidal ideation. This has resolved. The patient is able to contract for safety.  2. Mood. She is on Remeron for depression.  3. Hypertension. She is on metoprolol.  4. Dyslipidemia. She is on Lipitor.  5. Recent acute gastritis. She will continue Prilosec and Carafate.  6. Smoking. Nicotine patch was available.  7. Insomnia. She responded well to trazodone.  8. Disposition. She was discharged to home with her mother. She will follow up with [facility].   Consults:  None  Significant Diagnostic Studies:  None  Discharge Vitals:   Blood pressure 119/78, pulse 68, temperature 98.8 F (37.1 C), temperature source Oral, resp. rate 18, height 5\' 6"  (1.676 m), weight 54.432 kg (120 lb), SpO2 100 %. Body mass index is 19.38 kg/(m^2). Lab Results:   No results found for this or any previous visit (from  the past 72 hour(s)).  Physical Findings: AIMS: Facial and Oral Movements Muscles of Facial Expression: None, normal Lips and Perioral Area: None, normal Jaw: None, normal Tongue: None, normal,Extremity Movements Upper (arms, wrists, hands, fingers): None, normal Lower (legs, knees, ankles, toes): None, normal, Trunk Movements Neck, shoulders, hips: None, normal, Overall Severity Severity of abnormal movements (highest score from questions above): None, normal Incapacitation due to abnormal movements: None, normal Patient's awareness of abnormal movements (rate only patient's report): Aware, no distress, Dental Status Current problems with teeth and/or dentures?: No Does patient usually wear dentures?: No  CIWA:    COWS:      See Psychiatric Specialty Exam and Suicide Risk Assessment completed by Attending Physician prior to discharge.  Discharge destination:  Home  Is patient on multiple antipsychotic therapies at discharge:  No  Has Patient had three or more failed trials of antipsychotic monotherapy by history:  No    Recommended Plan for Multiple Antipsychotic Therapies: NA  Discharge Instructions    Diet - low sodium heart healthy    Complete by:  As directed      Increase activity slowly    Complete by:  As directed             Medication List    STOP taking these medications        zolpidem 5 MG tablet  Commonly known as:  AMBIEN      TAKE these medications      Indication   atorvastatin 40 MG tablet  Commonly known as:  LIPITOR  Take 1 tablet (40 mg total) by mouth daily at 6 PM.   Indication:  High Amount of Triglycerides in the Blood     diphenhydrAMINE 50 MG capsule  Commonly known as:  BENADRYL  Take 1 capsule (50 mg total) by mouth at bedtime as needed for sleep.      metoprolol 50 MG tablet  Commonly known as:  LOPRESSOR  Take 1 tablet (50 mg total) by mouth 2 (two) times daily.   Indication:  High Blood Pressure     mirtazapine 15 MG  tablet  Commonly known as:  REMERON  Take 1 tablet (15 mg total) by mouth at bedtime.   Indication:  Major Depressive Disorder     nicotine 21 mg/24hr patch  Commonly known as:  NICODERM CQ - dosed in mg/24 hours  Place 1 patch (21 mg total) onto the skin daily.      omeprazole 20 MG capsule  Commonly known as:  PRILOSEC  Take 1 capsule (20 mg total) by mouth 2 (two) times daily before a meal.   Indication:  Gastroesophageal Reflux Disease     sucralfate 1 G tablet  Commonly known as:  CARAFATE  Take 1 tablet (1 g total) by mouth 4 (four) times daily -  with meals and at bedtime.   Indication:  Gastroesophageal Reflux Disease     traZODone 100 MG tablet  Commonly known as:  DESYREL  Take 1 tablet (100 mg total) by mouth at bedtime.   Indication:  Trouble Sleeping         Follow-up recommendations:  Activity:  As tolerated. Diet:  Low sodium heart healthy Other:  Keep follow-up appointments  Comments:    Total Discharge Time: 35 min.  Signed: [name] 05/18/2015, 10:50 AM

## 2015-05-18 NOTE — BHH Suicide Risk Assessment (Signed)
[facility] Discharge Suicide Risk Assessment   Demographic Factors:  Divorced or widowed, Caucasian, Low socioeconomic status and Unemployed  Total Time spent with patient: 30 minutes  Musculoskeletal: Strength & Muscle Tone: within normal limits Gait & Station: normal Patient leans: N/A  Psychiatric Specialty Exam: Physical Exam  Nursing note and vitals reviewed.   Review of Systems  All other systems reviewed and are negative.   Blood pressure 124/81, pulse 59, temperature 98.8 F (37.1 C), temperature source Oral, resp. rate 18, height 5\' 6"  (1.676 m), weight 54.432 kg (120 lb), SpO2 100 %.Body mass index is 19.38 kg/(m^2).  General Appearance: Casual  Eye Contact::  Good  Speech:  Clear and [id number]  Volume:  Normal  Mood:  Euthymic  Affect:  Appropriate  Thought Process:  Goal Directed  Orientation:  Full (Time, Place, and Person)  Thought Content:  WDL  Suicidal Thoughts:  No  Homicidal Thoughts:  No  Memory:  Immediate;   Fair Recent;   Fair Remote;   Fair  Judgement:  Fair  Insight:  Fair  Psychomotor Activity:  Normal  Concentration:  Fair  Recall:  [employer] of [place]  Language: Fair  Akathisia:  No  Handed:  Right  AIMS (if indicated):     Assets:  Communication Skills Desire for Improvement Physical Health Resilience Social Support  Sleep:  Number of Hours: 5  Cognition: WNL  ADL's:  Intact   Have you used any form of tobacco in the last 30 days? (Cigarettes, Smokeless Tobacco, Cigars, and/or Pipes): Yes  Has this patient used any form of tobacco in the last 30 days? (Cigarettes, Smokeless Tobacco, Cigars, and/or Pipes) Yes, A prescription for an FDA-approved tobacco cessation medication was offered at discharge and the patient refused  Mental Status Per Nursing Assessment::   On Admission:     Current Mental Status by Physician: NA  Loss Factors: Loss of significant relationship and Financial problems/change in socioeconomic  status  Historical Factors: NA  Risk Reduction Factors:   Sense of responsibility to family, Living with another person, especially a relative and Positive social support  Continued Clinical Symptoms:  Depression:   Severe  Cognitive Features That Contribute To Risk:  None    Suicide Risk:  Minimal: No identifiable suicidal ideation.  Patients presenting with no risk factors but with morbid ruminations; may be classified as minimal risk based on the severity of the depressive symptoms  Principal Problem: Major depressive disorder, recurrent, severe with psychotic features Discharge Diagnoses:  Patient Active Problem List   Diagnosis Date Noted  . Acute gastritis [K29.00] 05/16/2015  . 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 Hypertensive urgency [I10] 05/13/2015  . Facial droop [R29.810] 05/13/2015  . Tobacco use disorder [Z72.0] 05/10/2015  . Cannabis use disorder, moderate, dependence [F12.20] 05/10/2015  . Major depressive disorder, recurrent, severe with psychotic features [F33.3] 05/09/2015  . Complicated grief [T61.44] 05/09/2015      Plan Of Care/Follow-up recommendations:  Activity:  as tolerated. Diet:  low sodium heart healthy. Other:  kep follow up appointments.  Is patient on multiple antipsychotic therapies at discharge:  No   Has Patient had three or more failed trials of antipsychotic monotherapy by history:  No  Recommended Plan for Multiple Antipsychotic Therapies: NA    [name] 05/18/2015, 7:49 AM

## 2015-05-18 NOTE — Plan of Care (Signed)
Problem: Diagnosis: Increased Risk For Suicide Attempt Goal: LTG-Patient Will Report Improved Mood and Deny Suicidal LTG (by discharge) Patient will report improved mood and deny suicidal ideation.  Outcome: Completed/Met Date Met:  05/18/15 Pt reports feeling much better, denies SI and is looking forward to discharge.

## 2015-05-18 NOTE — BHH Group Notes (Signed)
[place] Group Notes:  (Nursing/MHT/Case Management/Adjunct)  Date:  05/18/2015  Time:  12:10 PM  Type of Therapy:  Psychoeducational Skills  Participation Level:  Active  Participation Quality:  Appropriate  Affect:  Appropriate  Cognitive:  Appropriate  Insight:  Appropriate  Engagement in Group:  Engaged  Modes of Intervention:  Discussion and Education    Summary of Progress/Problems:  [name] 05/18/2015, 12:10 PM

## 2015-05-18 NOTE — Tx Team (Signed)
Interdisciplinary Treatment Plan Update (Adult)  Date:  05/18/2015 Time Reviewed:  11:53 AM  Progress in Treatment: Attending groups: Yes. Participating in groups:  Yes. Taking medication as prescribed:  Yes. Tolerating medication:  Yes. Family/Significant othe contact made:  Yes, individual(s) contacted:  patient's mother Zebedee Iba [phone] Patient understands diagnosis:  Yes. Discussing patient identified problems/goals with staff:  Yes. Medical problems stabilized or resolved:  Yes. Denies suicidal/homicidal ideation: Yes. Issues/concerns per patient self-inventory:  No. Other:  New problem(s) identified: No, Describe:  none reported  Discharge Plan or Barriers: Patient will discharge home with mental health followup at [facility] application.   Reason for Continuation of Hospitalization: Depression Suicidal ideation  Comments:  Estimated length of stay: up to 1 day expected discharge Friday 05/18/15  New goal(s):  Review of initial/current patient goals per problem list:   1.  Goal(s): decrease depression  Met:  No  Target date: 05/18/15  2.  Goal (s):eliminate SI  Met:  Yes  Target date: 05/18/15  As evidenced by: patients report of no SI    Attendees: Physician:  Orson Slick, MD 9/30/201611:53 AM  Nursing:   Elige Radon, RN 9/30/201611:53 AM  Other:  Carmell Austria, LCSWA 9/30/201611:53 AM  Other:   9/30/201611:53 AM  Other:   9/30/201611:53 AM  Other:  9/30/201611:53 AM  Other:  9/30/201611:53 AM  Other:  9/30/201611:53 AM  Other:  9/30/201611:53 AM  Other:  9/30/201611:53 AM  Other:  9/30/201611:53 AM  Other:   9/30/201611:53 AM   Scribe for Treatment Team:   Keene Breath, MSW, LCSWA  05/18/2015, 11:53 AM

## 2015-05-18 NOTE — Progress Notes (Signed)
D:Patient aware of discharge this shift . Patient returning home . Patient received all belonging locked up . Patient denies  Suicidal  And homicidal ideations  .  A: Writer instructed on discharge criteria  . Informed of 7 day supply  Of medication  . Aware  Of follow up appointment . R: Patient left unit with no questions  Or concerns  Mother  Picked patient up.

## 2015-05-30 ENCOUNTER — Ambulatory Visit: Payer: Medicaid Other

## 2016-10-20 ENCOUNTER — Ambulatory Visit: Payer: Self-pay | Admitting: Pharmacy Technician

## 2016-10-20 NOTE — Progress Notes (Signed)
Patient scheduled for assistance with completing paperwork to become recertified at [facility].  Patient did not show for the appointment on 10/20/16 at 9:00a.m.  Patient did not reschedule eligibility appointment.  [facility] will be unable to provide ongoing medication assistance until financial information for 2018 is provided.  [place] [facility]

## 2017-04-23 ENCOUNTER — Telehealth: Payer: Self-pay | Admitting: Pharmacy Technician

## 2017-04-23 NOTE — Telephone Encounter (Signed)
Patient failed to provide poi for 2018.  No additional medication assistance will be provided by [facility] without the required proof of income documentation.  Patient notified by letter.  [place] [facility]

## 2017-08-06 ENCOUNTER — Emergency Department: Payer: Self-pay

## 2017-08-06 ENCOUNTER — Emergency Department
Admission: EM | Admit: 2017-08-06 | Discharge: 2017-08-06 | Disposition: A | Discharge status: Home / Self Care | Payer: Self-pay | Attending: Emergency Medicine | Admitting: Emergency Medicine

## 2017-08-06 ENCOUNTER — Other Ambulatory Visit: Payer: Self-pay

## 2017-08-06 ENCOUNTER — Encounter: Payer: Self-pay | Admitting: Emergency Medicine

## 2017-08-06 DIAGNOSIS — F17210 Nicotine dependence, cigarettes, uncomplicated: Secondary | ICD-10-CM | POA: Insufficient documentation

## 2017-08-06 DIAGNOSIS — Z79899 Other long term (current) drug therapy: Secondary | ICD-10-CM | POA: Insufficient documentation

## 2017-08-06 DIAGNOSIS — R1013 Epigastric pain: Secondary | ICD-10-CM | POA: Insufficient documentation

## 2017-08-06 DIAGNOSIS — I10 Essential (primary) hypertension: Secondary | ICD-10-CM | POA: Insufficient documentation

## 2017-08-06 LAB — COMPREHENSIVE METABOLIC PANEL
ALBUMIN: 4.5 g/dL (ref 3.5–5.0)
ALK PHOS: 51 U/L (ref 38–126)
ALT: 20 U/L (ref 14–54)
ANION GAP: 10 (ref 5–15)
AST: 19 U/L (ref 15–41)
BILIRUBIN TOTAL: 1.4 mg/dL — AB (ref 0.3–1.2)
BUN: 24 mg/dL — ABNORMAL HIGH (ref 6–20)
CALCIUM: 8.8 mg/dL — AB (ref 8.9–10.3)
CO2: 21 mmol/L — ABNORMAL LOW (ref 22–32)
Chloride: 108 mmol/L (ref 101–111)
Creatinine, Ser: 0.71 mg/dL (ref 0.44–1.00)
GLUCOSE: 128 mg/dL — AB (ref 65–99)
POTASSIUM: 3.5 mmol/L (ref 3.5–5.1)
Sodium: 139 mmol/L (ref 135–145)
TOTAL PROTEIN: 6.8 g/dL (ref 6.5–8.1)

## 2017-08-06 LAB — CBC WITH DIFFERENTIAL/PLATELET
Basophils Absolute: 0.1 10*3/uL (ref 0–0.1)
Basophils Relative: 1 %
Eosinophils Absolute: 0 10*3/uL (ref 0–0.7)
Eosinophils Relative: 0 %
HEMATOCRIT: 46.6 % (ref 35.0–47.0)
Hemoglobin: 16.1 g/dL — ABNORMAL HIGH (ref 12.0–16.0)
LYMPHS ABS: 1.3 10*3/uL (ref 1.0–3.6)
Lymphocytes Relative: 14 %
MCH: 32 pg (ref 26.0–34.0)
MCHC: 34.5 g/dL (ref 32.0–36.0)
MCV: 92.7 fL (ref 80.0–100.0)
MONO ABS: 0.3 10*3/uL (ref 0.2–0.9)
MONOS PCT: 3 %
NEUTROS ABS: 7.9 10*3/uL — AB (ref 1.4–6.5)
Neutrophils Relative %: 82 %
Platelets: 225 10*3/uL (ref 150–440)
RBC: 5.02 MIL/uL (ref 3.80–5.20)
RDW: 13.1 % (ref 11.5–14.5)
WBC: 9.6 10*3/uL (ref 3.6–11.0)

## 2017-08-06 LAB — LIPASE, BLOOD: LIPASE: 20 U/L (ref 11–51)

## 2017-08-06 MED ORDER — FAMOTIDINE 40 MG PO TABS: 40.0000 mg | ORAL_TABLET | Freq: Every evening | ORAL | 1 refills | Status: DC

## 2017-08-06 MED ORDER — SODIUM CHLORIDE 0.9 % IV BOLUS (SEPSIS)
1000.0000 mL | Freq: Once | INTRAVENOUS | Status: AC
  Administered 2017-08-06: 1000 mL via INTRAVENOUS

## 2017-08-06 MED ORDER — SUCRALFATE 1 G PO TABS: 1.0000 g | ORAL_TABLET | Freq: Four times a day (QID) | ORAL | 0 refills | Status: DC

## 2017-08-06 MED ORDER — PROCHLORPERAZINE EDISYLATE 5 MG/ML IJ SOLN
10.0000 mg | Freq: Once | INTRAMUSCULAR | Status: AC
  Administered 2017-08-06: 10 mg via INTRAVENOUS
  Filled 2017-08-06: qty 2

## 2017-08-06 MED ORDER — BUTALBITAL-APAP-CAFFEINE 50-325-40 MG PO TABS
1.0000 | ORAL_TABLET | Freq: Once | ORAL | Status: AC
  Administered 2017-08-06: 1 via ORAL

## 2017-08-06 MED ORDER — BUTALBITAL-APAP-CAFFEINE 50-325-40 MG PO TABS
ORAL_TABLET | ORAL | Status: AC
  Filled 2017-08-06: qty 1

## 2017-08-06 NOTE — Discharge Instructions (Signed)
Please seek medical attention for any high fevers, chest pain, shortness of breath, change in behavior, persistent vomiting, bloody stool or any other new or concerning symptoms.  

## 2017-08-06 NOTE — ED Provider Notes (Signed)
[facility] Emergency Department Provider Note   ____________________________________________   I have reviewed the triage vital signs and the nursing notes.   HISTORY  Chief Complaint Emesis   History limited by: Not Limited   HPI [name] is a 51 y.o. female who presents to the emergency department today because of vomiting and abdominal pain.   LOCATION:epigastric DURATION:2 days TIMING: constant SEVERITY: severe QUALITY: sharp CONTEXT: patient states for the past two days she has been having profound vomiting. States she has not been able to keep anything down. The pain has been in her epigastric region. States she has had gastritis in the past and that it reminds her of that. MODIFYING FACTORS: none identified ASSOCIATED SYMPTOMS: headache. Subjective fever. Denies any diarrhea.   Per medical record review patient has a history of pancreatitis and gastritis.   Past Medical History:  Diagnosis Date  . Bell palsy   . Chronic headaches   . Hernia   . Hypertension   . Pancreatitis, acute   . Sinusitis   . Wrist fracture     Patient Active Problem List   Diagnosis Date Noted  . Major depressive disorder, recurrent, severe with psychotic features ([place])   . Acute gastritis 05/16/2015  . Hypertensive urgency 05/13/2015  . Facial droop 05/13/2015  . Tobacco use disorder 05/10/2015  . Cannabis use disorder, moderate, dependence ([place]) 05/10/2015  . Major depressive disorder, recurrent severe without psychotic features ([place]) 05/09/2015  . Complicated grief 81/27/5170    Past Surgical History:  Procedure Laterality Date  . ABDOMINAL HYSTERECTOMY    . CESAREAN SECTION      Prior to Admission medications   Medication Sig Start Date End Date Taking? Authorizing Provider  atorvastatin (LIPITOR) 40 MG tablet Take 1 tablet (40 mg total) by mouth daily at 6 PM. 05/17/15   Pucilowska, Jolanta B, MD  citalopram (CELEXA) 20 MG tablet Take 1  tablet (20 mg total) by mouth daily. 05/18/15   Pucilowska, Jolanta B, MD  diphenhydrAMINE (BENADRYL) 50 MG capsule Take 1 capsule (50 mg total) by mouth at bedtime as needed for sleep. 05/16/15   Gladstone Lighter, MD  metoprolol (LOPRESSOR) 50 MG tablet Take 1 tablet (50 mg total) by mouth 2 (two) times daily. 05/17/15   Pucilowska, Jolanta B, MD  nicotine (NICODERM CQ - DOSED IN MG/24 HOURS) 21 mg/24hr patch Place 1 patch (21 mg total) onto the skin daily. 05/16/15   Gladstone Lighter, MD  omeprazole (PRILOSEC) 20 MG capsule Take 1 capsule (20 mg total) by mouth 2 (two) times daily before a meal. 05/17/15   Pucilowska, Jolanta B, MD  sucralfate (CARAFATE) 1 G tablet Take 1 tablet (1 g total) by mouth 4 (four) times daily -  with meals and at bedtime. 05/17/15   Pucilowska, Herma Ard B, MD  traZODone (DESYREL) 100 MG tablet Take 1 tablet (100 mg total) by mouth at bedtime. 05/17/15   Pucilowska, Wardell Honour, MD    Allergies Pantoprazole sodium  Family History  Problem Relation Age of Onset  . Cancer Father   . Diabetes Other     Social History Social History   Tobacco Use  . Smoking status: Current Every Day Smoker    Packs/day: 1.00    Years: 20.00    Pack years: 20.00    Types: Cigarettes  . Smokeless tobacco: Never Used  Substance Use Topics  . Alcohol use: No  . Drug use: Yes    Types: Marijuana  Review of Systems Constitutional: Positive for subjective fever. Eyes: No visual changes. ENT: No sore throat. Cardiovascular: Denies chest pain. Respiratory: Denies shortness of breath. Gastrointestinal: Positive for epigastric pain. Positive for nausea and vomiting.  Genitourinary: Negative for dysuria. Musculoskeletal: Negative for back pain. Skin: Negative for rash. Neurological: Positive for headache.   ____________________________________________   PHYSICAL EXAM:  VITAL SIGNS: ED Triage Vitals  Enc Vitals Group     BP 08/06/17 0754 (!) 159/101     Pulse Rate  08/06/17 0754 (!) 110     Resp 08/06/17 0754 20     Temp 08/06/17 0754 98.9 F (37.2 C)     Temp Source 08/06/17 0754 Oral     SpO2 08/06/17 0754 96 %     Weight 08/06/17 0753 140 lb (63.5 kg)     Height 08/06/17 0753 5\' 6"  (1.676 m)     Head Circumference --      Peak Flow --      Pain Score 08/06/17 0803 10   Constitutional: Alert and oriented. Well appearing and in no distress. Eyes: Conjunctivae are normal.  ENT   Head: Normocephalic and atraumatic.   Nose: No congestion/rhinnorhea.   Mouth/Throat: Mucous membranes are moist.   Neck: No stridor. Hematological/Lymphatic/Immunilogical: No cervical lymphadenopathy. Cardiovascular: Normal rate, regular rhythm.  No murmurs, rubs, or gallops. Respiratory: Normal respiratory effort without tachypnea nor retractions. Breath sounds are clear and equal bilaterally. No wheezes/rales/rhonchi. Gastrointestinal: Soft and tender to palpation in the epigastric region. No rebound. No guarding.  Genitourinary: Deferred Musculoskeletal: Normal range of motion in all extremities. No lower extremity edema. Neurologic:  Normal speech and language. No gross focal neurologic deficits are appreciated.  Skin:  Skin is warm, dry and intact. No rash noted. Psychiatric: Mood and affect are normal. Speech and behavior are normal. Patient exhibits appropriate insight and judgment.  ____________________________________________    LABS (pertinent positives/negatives)  Lipase 20 CMP glu 128, cr 0.71 CBC wbc 9.6, hgb 16.1, plt 225  ____________________________________________   EKG  None  ____________________________________________    RADIOLOGY  [place] RUQ Gallbladder sludge  ____________________________________________   PROCEDURES  Procedures  ____________________________________________   INITIAL IMPRESSION / ASSESSMENT AND PLAN / ED COURSE  Pertinent labs & imaging results that were available during my care of the  patient were reviewed by me and considered in my medical decision making (see chart for details).  Patient presented to the emergency department today because of concern for epigastric pain. The ddx would include pancreatitis, gallstones, hepatitis, gastritis amongst other etiologies. [place] did show some GB sludge but no stones. No leukocytosis or concerning elevation of LFTs. Slight bili elevation likely secondary to poor PO intake. Will plan on treating for gastritis. Discussed results and plan with patient.   ____________________________________________   FINAL CLINICAL IMPRESSION(S) / ED DIAGNOSES  Final diagnoses:  Epigastric abdominal pain     Note: This dictation was prepared with Dragon dictation. Any transcriptional errors that result from this process are unintentional     Nance Pear, MD 08/06/17 1212

## 2017-08-06 NOTE — ED Notes (Signed)
Patient transported to Ultrasound 

## 2017-08-06 NOTE — ED Triage Notes (Signed)
Pt here for vomiting X 2 days. No diarrhea or fevers. No abdominal pain, just sore from vomiting. Ambulatory. VSS.  Also c/o headache as well.

## 2017-08-06 NOTE — ED Notes (Signed)
Pt reports headache unchanged and requesting something else for headache. Dr. Archie Balboa notified and orders received.

## 2018-10-21 ENCOUNTER — Encounter: Payer: Self-pay | Admitting: *Deleted

## 2018-10-27 ENCOUNTER — Encounter: Payer: Self-pay | Admitting: *Deleted

## 2018-10-27 ENCOUNTER — Other Ambulatory Visit: Payer: Self-pay

## 2018-10-27 ENCOUNTER — Ambulatory Visit: Admitting: Certified Registered"

## 2018-10-27 ENCOUNTER — Encounter
Admission: RE | Disposition: A | Discharge status: Home / Self Care | Payer: Self-pay | Source: Home / Self Care | Attending: Ophthalmology

## 2018-10-27 ENCOUNTER — Ambulatory Visit
Admission: RE | Admit: 2018-10-27 | Discharge: 2018-10-27 | Disposition: A | Discharge status: Home / Self Care | Attending: Ophthalmology | Admitting: Ophthalmology

## 2018-10-27 DIAGNOSIS — F17200 Nicotine dependence, unspecified, uncomplicated: Secondary | ICD-10-CM | POA: Diagnosis not present

## 2018-10-27 DIAGNOSIS — H2511 Age-related nuclear cataract, right eye: Secondary | ICD-10-CM | POA: Diagnosis present

## 2018-10-27 DIAGNOSIS — I10 Essential (primary) hypertension: Secondary | ICD-10-CM | POA: Insufficient documentation

## 2018-10-27 DIAGNOSIS — Z888 Allergy status to other drugs, medicaments and biological substances status: Secondary | ICD-10-CM | POA: Insufficient documentation

## 2018-10-27 DIAGNOSIS — Z8544 Personal history of malignant neoplasm of other female genital organs: Secondary | ICD-10-CM | POA: Insufficient documentation

## 2018-10-27 HISTORY — DX: Anxiety disorder, unspecified: F41.9

## 2018-10-27 HISTORY — DX: Major depressive disorder, single episode, unspecified: F32.9

## 2018-10-27 HISTORY — DX: Depression, unspecified: F32.A

## 2018-10-27 HISTORY — DX: Malignant (primary) neoplasm, unspecified: C80.1

## 2018-10-27 HISTORY — PX: CATARACT EXTRACTION W/PHACO: SHX586

## 2018-10-27 LAB — URINE DRUG SCREEN, QUALITATIVE (ARMC ONLY)
Amphetamines, Ur Screen: NOT DETECTED
BENZODIAZEPINE, UR SCRN: POSITIVE — AB
Barbiturates, Ur Screen: NOT DETECTED
Cannabinoid 50 Ng, Ur ~~LOC~~: POSITIVE — AB
Cocaine Metabolite,Ur ~~LOC~~: NOT DETECTED
MDMA (Ecstasy)Ur Screen: NOT DETECTED
METHADONE SCREEN, URINE: NOT DETECTED
Opiate, Ur Screen: NOT DETECTED
Phencyclidine (PCP) Ur S: NOT DETECTED
Tricyclic, Ur Screen: NOT DETECTED

## 2018-10-27 SURGERY — PHACOEMULSIFICATION, CATARACT, WITH IOL INSERTION
Anesthesia: Monitor Anesthesia Care | Site: Eye | Laterality: Right

## 2018-10-27 MED ORDER — POVIDONE-IODINE 5 % OP SOLN
OPHTHALMIC | Status: AC
  Filled 2018-10-27: qty 30

## 2018-10-27 MED ORDER — ARMC OPHTHALMIC DILATING DROPS
OPHTHALMIC | Status: AC
  Administered 2018-10-27: 1 via OPHTHALMIC
  Filled 2018-10-27: qty 0.5

## 2018-10-27 MED ORDER — POVIDONE-IODINE 5 % OP SOLN
OPHTHALMIC | Status: DC | PRN
  Administered 2018-10-27: 1 via OPHTHALMIC

## 2018-10-27 MED ORDER — MOXIFLOXACIN HCL 0.5 % OP SOLN: 1.0000 [drp] | OPHTHALMIC | Status: DC | PRN

## 2018-10-27 MED ORDER — TETRACAINE HCL 0.5 % OP SOLN
OPHTHALMIC | Status: AC
  Administered 2018-10-27: 1 [drp] via OPHTHALMIC
  Filled 2018-10-27: qty 4

## 2018-10-27 MED ORDER — FENTANYL CITRATE (PF) 100 MCG/2ML IJ SOLN
INTRAMUSCULAR | Status: AC
  Filled 2018-10-27: qty 2

## 2018-10-27 MED ORDER — EPINEPHRINE PF 1 MG/ML IJ SOLN
INTRAOCULAR | Status: DC | PRN
  Administered 2018-10-27: 1 mL via OPHTHALMIC

## 2018-10-27 MED ORDER — TETRACAINE HCL 0.5 % OP SOLN
1.0000 [drp] | OPHTHALMIC | Status: AC | PRN
  Administered 2018-10-27 (×2): 1 [drp] via OPHTHALMIC

## 2018-10-27 MED ORDER — MIDAZOLAM HCL 2 MG/2ML IJ SOLN
INTRAMUSCULAR | Status: DC | PRN
  Administered 2018-10-27: 1 mg via INTRAVENOUS
  Administered 2018-10-27 (×2): 0.5 mg via INTRAVENOUS

## 2018-10-27 MED ORDER — TRYPAN BLUE 0.06 % OP SOLN
OPHTHALMIC | Status: AC
  Filled 2018-10-27: qty 0.5

## 2018-10-27 MED ORDER — LIDOCAINE HCL (PF) 4 % IJ SOLN
INTRAMUSCULAR | Status: AC
  Filled 2018-10-27: qty 5

## 2018-10-27 MED ORDER — NA HYALUR & NA CHOND-NA HYALUR 0.55-0.5 ML IO KIT
PACK | INTRAOCULAR | Status: AC
  Filled 2018-10-27: qty 1.05

## 2018-10-27 MED ORDER — EPINEPHRINE PF 1 MG/ML IJ SOLN
INTRAMUSCULAR | Status: AC
  Filled 2018-10-27: qty 1

## 2018-10-27 MED ORDER — MOXIFLOXACIN HCL 0.5 % OP SOLN
OPHTHALMIC | Status: AC
  Administered 2018-10-27: 1 [drp] via OPHTHALMIC
  Filled 2018-10-27: qty 3

## 2018-10-27 MED ORDER — BUPIVACAINE HCL (PF) 0.75 % IJ SOLN
INTRAMUSCULAR | Status: AC
  Filled 2018-10-27: qty 10

## 2018-10-27 MED ORDER — TRYPAN BLUE 0.06 % OP SOLN
OPHTHALMIC | Status: DC | PRN
  Administered 2018-10-27: .5 mL via INTRAOCULAR

## 2018-10-27 MED ORDER — MIDAZOLAM HCL 2 MG/2ML IJ SOLN
INTRAMUSCULAR | Status: AC
  Filled 2018-10-27: qty 2

## 2018-10-27 MED ORDER — NA HYALUR & NA CHOND-NA HYALUR 0.4-0.35 ML IO KIT
PACK | INTRAOCULAR | Status: DC | PRN
  Administered 2018-10-27: 1 mL via INTRAOCULAR

## 2018-10-27 MED ORDER — SODIUM HYALURONATE 23 MG/ML IO SOLN
INTRAOCULAR | Status: DC | PRN
  Administered 2018-10-27: .5 mL via INTRAOCULAR

## 2018-10-27 MED ORDER — SODIUM CHLORIDE 0.9 % IV SOLN
INTRAVENOUS | Status: DC
  Administered 2018-10-27: 07:00:00 via INTRAVENOUS

## 2018-10-27 MED ORDER — NEOMYCIN-POLYMYXIN-DEXAMETH 3.5-10000-0.1 OP OINT
TOPICAL_OINTMENT | OPHTHALMIC | Status: AC
  Filled 2018-10-27: qty 3.5

## 2018-10-27 MED ORDER — NA CHONDROIT SULF-NA HYALURON 40-30 MG/ML IO SOLN
INTRAOCULAR | Status: AC
  Filled 2018-10-27: qty 0.5

## 2018-10-27 MED ORDER — MOXIFLOXACIN HCL 0.5 % OP SOLN
1.0000 [drp] | OPHTHALMIC | Status: DC | PRN
  Administered 2018-10-27: 1 [drp] via OPHTHALMIC

## 2018-10-27 MED ORDER — FENTANYL CITRATE (PF) 100 MCG/2ML IJ SOLN
INTRAMUSCULAR | Status: DC | PRN
  Administered 2018-10-27: 50 ug via INTRAVENOUS
  Administered 2018-10-27 (×2): 25 ug via INTRAVENOUS

## 2018-10-27 MED ORDER — LIDOCAINE HCL (PF) 4 % IJ SOLN
INTRAOCULAR | Status: DC | PRN
  Administered 2018-10-27: 2 mL via OPHTHALMIC

## 2018-10-27 MED ORDER — NEOMYCIN-POLYMYXIN-DEXAMETH 0.1 % OP OINT
TOPICAL_OINTMENT | OPHTHALMIC | Status: DC | PRN
  Administered 2018-10-27: 1 via OPHTHALMIC

## 2018-10-27 MED ORDER — CARBACHOL 0.01 % IO SOLN
INTRAOCULAR | Status: DC | PRN
  Administered 2018-10-27: .5 mL via INTRAOCULAR

## 2018-10-27 MED ORDER — ARMC OPHTHALMIC DILATING DROPS
1.0000 "application " | OPHTHALMIC | Status: AC
  Administered 2018-10-27 (×3): 1 via OPHTHALMIC

## 2018-10-27 SURGICAL SUPPLY — 18 items
GLOVE BIO SURGEON STRL SZ8 (GLOVE) ×3 IMPLANT
GLOVE BIOGEL M 6.5 STRL (GLOVE) ×3 IMPLANT
GLOVE SURG LX 7.5 STRW (GLOVE) ×2
GLOVE SURG LX STRL 7.5 STRW (GLOVE) ×1 IMPLANT
GOWN STRL REUS W/ TWL LRG LVL3 (GOWN DISPOSABLE) ×2 IMPLANT
GOWN STRL REUS W/TWL LRG LVL3 (GOWN DISPOSABLE) ×4
LABEL CATARACT MEDS ST (LABEL) ×3 IMPLANT
LENS IOL TECNIS ITEC 20.5 (Intraocular Lens) ×2 IMPLANT
NDL HPO THNWL 1X22GA REG BVL (NEEDLE) ×1 IMPLANT
NEEDLE SAFETY 22GX1 (NEEDLE) ×2
PACK CATARACT (MISCELLANEOUS) ×3 IMPLANT
PACK CATARACT BRASINGTON LX (MISCELLANEOUS) ×3 IMPLANT
PACK EYE AFTER SURG (MISCELLANEOUS) ×3 IMPLANT
SOL BSS BAG (MISCELLANEOUS) ×3
SOLUTION BSS BAG (MISCELLANEOUS) ×1 IMPLANT
SYR 5ML LL (SYRINGE) ×3 IMPLANT
WATER STERILE IRR 250ML POUR (IV SOLUTION) ×3 IMPLANT
WIPE NON LINTING 3.25X3.25 (MISCELLANEOUS) ×3 IMPLANT

## 2018-10-27 NOTE — Anesthesia Postprocedure Evaluation (Signed)
Anesthesia Post Note  Patient: [name]  Procedure(s) Performed: CATARACT EXTRACTION PHACO AND INTRAOCULAR LENS PLACEMENT (IOC)-RIGHT 1ST CASE PER POSTING SHEET (Right Eye)  Patient location during evaluation: Phase II Anesthesia Type: MAC Level of consciousness: awake, awake and alert and oriented Pain management: pain level controlled Vital Signs Assessment: post-procedure vital signs reviewed and stable Respiratory status: spontaneous breathing Cardiovascular status: blood pressure returned to baseline Anesthetic complications: no     Last Vitals:  Vitals:   10/27/18 0620  BP: (!) 140/100  Pulse: 82  Resp: 20  Temp: 36.6 C  SpO2: 95%    Last Pain:  Vitals:   10/27/18 0620  TempSrc: Tympanic  PainSc: [place]a Willig

## 2018-10-27 NOTE — Anesthesia Post-op Follow-up Note (Signed)
Anesthesia QCDR form completed.        

## 2018-10-27 NOTE — Op Note (Signed)
OPERATIVE NOTE  [name] [id number] 10/27/2018   PREOPERATIVE DIAGNOSIS:  nuclear sclerotic cataract right eye            Mature (Total) Cataract Right Eye H25.89  0  POSTOPERATIVE DIAGNOSIS: nuclear sclerotic mature(total) cataract right eye          PROCEDURE:  Phacoemusification with posterior chamber intraocular lens placement of the right eye .  Vision Blue dye was used to stain the lens capsule.  LENS:   Implant Name Type Inv. Item Serial No. Manufacturer Lot No. LRB No. Used  LENS IOL DIOP 20.5 - [id number] Intraocular Lens LENS IOL DIOP 20.5 [id number] 1910 AMO  Right 1       ULTRASOUND TIME: 12 of 1 minutes 43 seconds, CDE 16.6  SURGEON:  Wyonia Hough, MD   ANESTHESIA:  Topical with tetracaine drops and 2% Xylocaine jelly, augmented with 1% preservative-free intracameral lidocaine.   COMPLICATIONS:  None.   DESCRIPTION OF PROCEDURE:  The patient was identified in the holding room and transported to the operating room and placed in the supine position under the operating microscope. Theright eye was identified as the operative eye and it was prepped and draped in the usual sterile ophthalmic fashion.  A 1 millimeter clear-corneal paracentesis was made at the 12:00 position.  0.5 ml of preservative-free 1% lidocaine was injected into the anterior chamber. The anterior chamber was filled with Healon 5 viscoelastic.  A 2.4 millimeter keratome was used to make a near-clear corneal incision at the 9:00 position.  The anterior chamber was filled with Healon 5 viscoelastic.  Vision Blue dye was then injected under the viscoelastic to stain the lens capsule.  BSS was then used to wash the dye out.  Additional Healon 5 was placed into the anterior chamber.  A curvilinear capsulorrhexis was made with a cystotome and capsulorrhexis forceps.  Balanced salt solution was used to hydrodissect and hydrodelineate the nucleus.  Viscoat was then placed in the anterior chamber.    Phacoemulsification was then used in stop and chop fashion to remove the lens nucleus and epinucleus.  The remaining cortex was then removed using the irrigation and aspiration handpiece. Provisc was then placed into the capsular bag to distend it for lens placement.  A 20.5 -diopter lens was then injected into the capsular bag.  The remaining viscoelastic was aspirated.   Wounds were hydrated with balanced salt solution.  The anterior chamber was inflated to a physiologic pressure with balanced salt solution. Vigamox 0.2 ml of a 1mg  per ml solution was injected into the anterior chamber for a dose of 0.2 mg of intracameral antibiotic at the completion of the case. Miostat was placed into the anterior chamber to constrict the pupil.  No wound leaks were noted.  Topical Vigamox drops and Maxitrol ointment were applied to the eye.  The patient was taken to the recovery room in stable condition without complications of anesthesia or surgery.  [name] 10/27/2018, 8:10 AM

## 2018-10-27 NOTE — H&P (Signed)

## 2018-10-27 NOTE — Transfer of Care (Signed)
Immediate Anesthesia Transfer of Care Note  Patient: [name]  Procedure(s) Performed: CATARACT EXTRACTION PHACO AND INTRAOCULAR LENS PLACEMENT (IOC)-RIGHT 1ST CASE PER POSTING SHEET (Right Eye)  Patient Location: PACU  Anesthesia Type:MAC  Level of Consciousness: awake, alert  and oriented  Airway & Oxygen Therapy: Patient Spontanous Breathing  Post-op Assessment: Report given to RN and Post -op Vital signs reviewed and stable  Post vital signs: Reviewed and stable  Last Vitals:  Vitals Value Taken Time  BP    Temp    Pulse    Resp    SpO2      Last Pain:  Vitals:   10/27/18 0620  TempSrc: Tympanic  PainSc: 4          Complications: No apparent anesthesia complications

## 2018-10-27 NOTE — Discharge Instructions (Signed)
Eye Surgery Discharge Instructions    Expect mild scratchy sensation or mild soreness. DO NOT RUB YOUR EYE!  The day of surgery:  Minimal physical activity, but bed rest is not required  No reading, computer work, or close hand work  No bending, lifting, or straining.  May watch TV  For 24 hours:  No driving, legal decisions, or alcoholic beverages  Safety precautions  Eat anything you prefer: It is better to start with liquids, then soup then solid foods.  _____ Eye patch should be worn until postoperative exam tomorrow.  ____ Solar shield eyeglasses should be worn for comfort in the sunlight/patch while sleeping  Resume all regular medications including aspirin or Coumadin if these were discontinued prior to surgery. You may shower, bathe, shave, or wash your hair. Tylenol may be taken for mild discomfort.  Call your doctor if you experience significant pain, nausea, or vomiting, fever > 101 or other signs of infection. [phone] or [phone] Specific instructions:  Follow-up Information    Leandrew Koyanagi, MD Follow up.   Specialty:  Ophthalmology Why:  thursday 3-12 at [place] information: [address]   [place] [address] [phone]

## 2018-10-27 NOTE — Anesthesia Preprocedure Evaluation (Addendum)
Anesthesia Evaluation  Patient identified by MRN, date of birth, ID band Patient awake    Reviewed: Allergy & Precautions, NPO status , Patient's Chart, lab work & pertinent test results  Airway Mallampati: III  TM Distance: <3 FB     Dental  (+) Poor Dentition   Pulmonary Current Smoker,    Pulmonary exam normal        Cardiovascular hypertension, Pt. on medications Normal cardiovascular exam     Neuro/Psych  Headaches, PSYCHIATRIC DISORDERS Anxiety Depression  Neuromuscular disease    GI/Hepatic Neg liver ROS,   Endo/Other  negative endocrine ROS  Renal/GU negative Renal ROS  negative genitourinary   Musculoskeletal negative musculoskeletal ROS (+)   Abdominal Normal abdominal exam  (+)   Peds negative pediatric ROS (+)  Hematology negative hematology ROS (+)   Anesthesia Other Findings   Reproductive/Obstetrics                             Anesthesia Physical Anesthesia Plan  ASA: III  Anesthesia Plan: MAC   Post-op Pain Management:    Induction: Intravenous  PONV Risk Score and Plan:   Airway Management Planned: Nasal Cannula  Additional Equipment:   Intra-op Plan:   Post-operative Plan:   Informed Consent: I have reviewed the patients History and Physical, chart, labs and discussed the procedure including the risks, benefits and alternatives for the proposed anesthesia with the patient or authorized representative who has indicated his/her understanding and acceptance.     Dental advisory given  Plan Discussed with: CRNA and Surgeon  Anesthesia Plan Comments:         Anesthesia Quick Evaluation

## 2018-10-28 ENCOUNTER — Encounter: Payer: Self-pay | Admitting: Ophthalmology

## 2018-11-20 ENCOUNTER — Other Ambulatory Visit: Payer: Self-pay

## 2018-11-20 ENCOUNTER — Emergency Department
Admission: EM | Admit: 2018-11-20 | Discharge: 2018-11-21 | Disposition: A | Discharge status: Home / Self Care | Attending: Emergency Medicine | Admitting: Emergency Medicine

## 2018-11-20 ENCOUNTER — Encounter: Payer: Self-pay | Admitting: Emergency Medicine

## 2018-11-20 DIAGNOSIS — J029 Acute pharyngitis, unspecified: Secondary | ICD-10-CM

## 2018-11-20 DIAGNOSIS — F17210 Nicotine dependence, cigarettes, uncomplicated: Secondary | ICD-10-CM | POA: Insufficient documentation

## 2018-11-20 DIAGNOSIS — Z8544 Personal history of malignant neoplasm of other female genital organs: Secondary | ICD-10-CM | POA: Insufficient documentation

## 2018-11-20 DIAGNOSIS — I10 Essential (primary) hypertension: Secondary | ICD-10-CM | POA: Insufficient documentation

## 2018-11-20 DIAGNOSIS — R05 Cough: Secondary | ICD-10-CM

## 2018-11-20 DIAGNOSIS — H6690 Otitis media, unspecified, unspecified ear: Secondary | ICD-10-CM

## 2018-11-20 DIAGNOSIS — R0981 Nasal congestion: Secondary | ICD-10-CM | POA: Insufficient documentation

## 2018-11-20 DIAGNOSIS — R059 Cough, unspecified: Secondary | ICD-10-CM

## 2018-11-20 DIAGNOSIS — F1210 Cannabis abuse, uncomplicated: Secondary | ICD-10-CM | POA: Insufficient documentation

## 2018-11-20 DIAGNOSIS — R070 Pain in throat: Secondary | ICD-10-CM | POA: Insufficient documentation

## 2018-11-20 NOTE — ED Triage Notes (Signed)
Patient with complaint of cough, sore throat and congestion times three days.

## 2018-11-21 ENCOUNTER — Emergency Department

## 2018-11-21 ENCOUNTER — Encounter: Payer: Self-pay | Admitting: Emergency Medicine

## 2018-11-21 LAB — COMPREHENSIVE METABOLIC PANEL
ALT: 16 U/L (ref 0–44)
AST: 16 U/L (ref 15–41)
Albumin: 4.5 g/dL (ref 3.5–5.0)
Alkaline Phosphatase: 68 U/L (ref 38–126)
Anion gap: 11 (ref 5–15)
BUN: 18 mg/dL (ref 6–20)
CO2: 23 mmol/L (ref 22–32)
Calcium: 8.6 mg/dL — ABNORMAL LOW (ref 8.9–10.3)
Chloride: 107 mmol/L (ref 98–111)
Creatinine, Ser: 0.62 mg/dL (ref 0.44–1.00)
GFR calc Af Amer: >60 mL/min (ref >60)
GFR calc non Af Amer: >60 mL/min (ref >60)
Glucose, Bld: 134 mg/dL — ABNORMAL HIGH (ref 70–99)
Potassium: 3.2 mmol/L — ABNORMAL LOW (ref 3.5–5.1)
Sodium: 141 mmol/L (ref 135–145)
Total Bilirubin: 0.6 mg/dL (ref 0.3–1.2)
Total Protein: 6.8 g/dL (ref 6.5–8.1)

## 2018-11-21 LAB — CBC WITH DIFFERENTIAL/PLATELET
Abs Immature Granulocytes: 0.08 10*3/uL — ABNORMAL HIGH (ref 0.00–0.07)
Basophils Absolute: 0.1 10*3/uL (ref 0.0–0.1)
Basophils Relative: 0 %
Eosinophils Absolute: 0.2 10*3/uL (ref 0.0–0.5)
Eosinophils Relative: 1 %
HCT: 44.9 % (ref 36.0–46.0)
Hemoglobin: 15.3 g/dL — ABNORMAL HIGH (ref 12.0–15.0)
Immature Granulocytes: 1 %
Lymphocytes Relative: 11 %
Lymphs Abs: 1.7 10*3/uL (ref 0.7–4.0)
MCH: 31.7 pg (ref 26.0–34.0)
MCHC: 34.1 g/dL (ref 30.0–36.0)
MCV: 93.2 fL (ref 80.0–100.0)
Monocytes Absolute: 1 10*3/uL (ref 0.1–1.0)
Monocytes Relative: 7 %
Neutro Abs: 12.5 10*3/uL — ABNORMAL HIGH (ref 1.7–7.7)
Neutrophils Relative %: 80 %
Platelets: 163 10*3/uL (ref 150–400)
RBC: 4.82 MIL/uL (ref 3.87–5.11)
RDW: 12.1 % (ref 11.5–15.5)
WBC: 15.6 10*3/uL — ABNORMAL HIGH (ref 4.0–10.5)
nRBC: 0 % (ref 0.0–0.2)

## 2018-11-21 LAB — GROUP A STREP BY PCR: Group A Strep by PCR: NOT DETECTED

## 2018-11-21 MED ORDER — MENTHOL 3 MG MT LOZG
1.0000 | LOZENGE | OROMUCOSAL | Status: DC | PRN
  Filled 2018-11-21: qty 9

## 2018-11-21 MED ORDER — AZITHROMYCIN 500 MG PO TABS
500.0000 mg | ORAL_TABLET | Freq: Once | ORAL | Status: AC
  Administered 2018-11-21: 500 mg via ORAL
  Filled 2018-11-21: qty 1

## 2018-11-21 MED ORDER — PHENOL 1.4 % MT LIQD
1.0000 | OROMUCOSAL | Status: DC | PRN
  Administered 2018-11-21: 02:00:00 1 via OROMUCOSAL
  Filled 2018-11-21: qty 177

## 2018-11-21 MED ORDER — AZITHROMYCIN 250 MG PO TABS: ORAL_TABLET | ORAL | 0 refills | Status: AC

## 2018-11-21 NOTE — Discharge Instructions (Addendum)
Please take the Zithromax as directed.  Please return for any worsening at all.  Also return or see your doctor if not a good bit better in 2 days.

## 2018-11-21 NOTE — ED Provider Notes (Addendum)
[facility] Emergency Department Provider Note   ____________________________________________   First MD Initiated Contact with Patient 11/21/18 0005     (approximate)  I have reviewed the triage vital signs and the nursing notes.   HISTORY  Chief Complaint Cough and Sore Throat   HPI Patient is a 53 year old female who comes from home.  She complains of 3 days of cough and sore throat.  She reports coughing up thick phlegm.  She is not sure of the color.  She says the congestion is gone up into her ears.  She denies running any fever.  Here she does not have a fever although she was tachycardic and wearing her coat.  Of course it is cold in the emergency room.  Sore throat is mild   Past Medical History:  Diagnosis Date  . Anxiety    PANIC ATTACKS  . Bell palsy   . 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 Chronic headaches   . Depression   . Hernia   . Hypertension   . Pancreatitis, acute   . Sinusitis   . Wrist fracture     Patient Active Problem List   Diagnosis Date Noted  . Major depressive disorder, recurrent, severe with psychotic features ([place])   . Acute gastritis 05/16/2015  . Hypertensive urgency 05/13/2015  . Facial droop 05/13/2015  . Tobacco use disorder 05/10/2015  . Cannabis use disorder, moderate, dependence ([place]) 05/10/2015  . Major depressive disorder, recurrent severe without psychotic features ([place]) 05/09/2015  . 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 ABDOMINAL HYSTERECTOMY    . CATARACT EXTRACTION W/PHACO Right 10/27/2018   Procedure: CATARACT EXTRACTION PHACO AND INTRAOCULAR LENS PLACEMENT (IOC)-RIGHT 1ST CASE PER POSTING SHEET;  Surgeon: Leandrew Koyanagi, MD;  Location: [facility] ORS;  Service: Ophthalmology;  Laterality: Right;  [place]  01:43 CDE 16.55 AP% 12.2 Fluid pack lot # [id number]  . CESAREAN SECTION      Prior to Admission medications   Medication Sig Start Date End Date  Taking? Authorizing Provider  acetaminophen (TYLENOL) 325 MG tablet Take 650 mg by mouth every 6 (six) hours as needed (for pain.).    [provider]  albuterol (PROVENTIL HFA;VENTOLIN HFA) 108 (90 Base) MCG/ACT inhaler Inhale 1-2 puffs into the lungs every 6 (six) hours as needed for wheezing or shortness of breath.    [provider]  diphenhydrAMINE (BENADRYL) 25 MG tablet Take 50 mg by mouth 2 (two) times daily.    [provider]  GOODYS EXTRA STRENGTH [phone] MG PACK Take 1 packet by mouth 3 (three) times daily as needed (pain.).    [provider]    Allergies Pantoprazole sodium  Family History  Problem Relation Age of Onset  . Cancer Father   . Diabetes Other     Social History Social History   Tobacco Use  . Smoking status: Current Every Day Smoker    Packs/day: 1.00    Years: 20.00    Pack years: 20.00    Types: Cigarettes  . Smokeless tobacco: Never Used  Substance Use Topics  . Alcohol use: No  . Drug use: Yes    Types: Marijuana    Review of Systems  Constitutional: No fever/chills Eyes: No visual changes. ENT:  sore throat. Cardiovascular: Denies chest pain. Respiratory: Denies shortness of breath. Gastrointestinal: No abdominal pain.  No nausea, no vomiting.  No diarrhea.  No constipation. Genitourinary: Negative for dysuria. Musculoskeletal:  Negative for back pain. Skin: Negative for rash. Neurological: Negative for headaches, focal weakness   ____________________________________________   PHYSICAL EXAM:  VITAL SIGNS: ED Triage Vitals [11/20/18 2338]  Enc Vitals Group     BP (!) 180/108     Pulse Rate (!) 113     Resp 18     Temp 98.4 F (36.9 C)     Temp Source Oral     SpO2 94 %     Weight 163 lb (73.9 kg)     Height 5\' 6"  (1.676 m)     Head Circumference      Peak Flow      Pain Score 10     Pain Loc      Pain Edu?      Excl. in [place]?     Constitutional: Alert and oriented. Well appearing  and in no acute distress. Eyes: Conjunctivae are norma Head: Atraumatic.  Ears right TM is clear left TM is red and bulging. Nose: No congestion/rhinnorhea. Mouth/Throat: Mucous membranes are moist.  Oropharynx mildly erythematous. Neck: No stridor.   Hematological/Lymphatic/Immunilogical: No cervical lymphadenopathy. Cardiovascular: Normal rate, regular rhythm. Grossly normal heart sounds.  Good peripheral circulation. Respiratory: Normal respiratory effort.  No retractions. Lungs CTAB. Gastrointestinal: Soft and nontender. No distention. No abdominal bruits. No CVA tenderness. Musculoskeletal: No lower extremity tenderness nor edema.  Neurologic:  Normal speech and language. No gross focal neurologic deficits are appreciated.  Skin:  Skin is warm, dry and intact. No rash noted.   ____________________________________________   LABS (all labs ordered are listed, but only abnormal results are displayed)  Labs Reviewed  COMPREHENSIVE METABOLIC PANEL - Abnormal; Notable for the following components:      Result Value   Potassium 3.2 (*)    Glucose, Bld 134 (*)    Calcium 8.6 (*)    All other components within normal limits  CBC WITH DIFFERENTIAL/PLATELET - Abnormal; Notable for the following components:   WBC 15.6 (*)    Hemoglobin 15.3 (*)    Neutro Abs 12.5 (*)    Abs Immature Granulocytes 0.08 (*)    All other components within normal limits  GROUP A STREP BY PCR   ____________________________________________  EKG  EKG read interpreted by me shows normal sinus rhythm rate of 95 normal axis biatrial enlargement nonspecific ST-T wave changes ____________________________________________  RADIOLOGY Chest x-ray read by radiology as no acute disease I reviewed the film  Official radiology report(s): Dg Chest Portable 1 View  Result Date: 11/21/2018 CLINICAL DATA:  Productive cough. EXAM: PORTABLE CHEST 1 VIEW COMPARISON:  September 18, 2014 FINDINGS: The heart size and  mediastinal contours are within normal limits. Both lungs are clear. The visualized skeletal structures are unremarkable. IMPRESSION: No active disease. Electronically Signed   By: Dorise Bullion III M.D   On: 11/21/2018 01:10    ____________________________________________   PROCEDURES  Procedure(s) performed (including Critical Care):  Procedures   ____________________________________________   INITIAL IMPRESSION / ASSESSMENT AND PLAN / ED COURSE  Patient with productive cough otitis media and a sore throat.  I will get her some Zithromax.  We will have her return if she is worse or no better in 2 days or follow-up with her doctor.  She had 1 isolated low O2 sat that was documented and followed and proceeded immediately by a normal O2 sats.  I believe this was an error.  She is not short of breath.  ____________________________________________   FINAL CLINICAL IMPRESSION(S) / ED DIAGNOSES  Final diagnoses:  Sore throat  Acute otitis media, unspecified otitis media type  Cough     ED Discharge Orders    None       Note:  This document was prepared using Dragon voice recognition software and may include unintentional dictation errors.    Nena Polio, MD 11/21/18 0121    Nena Polio, MD 12/02/18 [phone]

## 2020-05-03 ENCOUNTER — Encounter: Payer: Self-pay | Admitting: Emergency Medicine

## 2020-05-03 ENCOUNTER — Emergency Department
Admission: EM | Admit: 2020-05-03 | Discharge: 2020-05-03 | Disposition: A | Discharge status: Home / Self Care | Attending: Emergency Medicine | Admitting: Emergency Medicine

## 2020-05-03 DIAGNOSIS — W57XXXA Bitten or stung by nonvenomous insect and other nonvenomous arthropods, initial encounter: Secondary | ICD-10-CM | POA: Insufficient documentation

## 2020-05-03 DIAGNOSIS — S70362A Insect bite (nonvenomous), left thigh, initial encounter: Secondary | ICD-10-CM | POA: Insufficient documentation

## 2020-05-03 DIAGNOSIS — L03116 Cellulitis of left lower limb: Secondary | ICD-10-CM | POA: Insufficient documentation

## 2020-05-03 DIAGNOSIS — I10 Essential (primary) hypertension: Secondary | ICD-10-CM | POA: Insufficient documentation

## 2020-05-03 DIAGNOSIS — F17210 Nicotine dependence, cigarettes, uncomplicated: Secondary | ICD-10-CM | POA: Insufficient documentation

## 2020-05-03 MED ORDER — SULFAMETHOXAZOLE-TRIMETHOPRIM 800-160 MG PO TABS: 1.0000 | ORAL_TABLET | Freq: Two times a day (BID) | ORAL | 0 refills | Status: DC

## 2020-05-03 MED ORDER — SULFAMETHOXAZOLE-TRIMETHOPRIM 800-160 MG PO TABS
1.0000 | ORAL_TABLET | Freq: Once | ORAL | Status: AC
  Administered 2020-05-03: 1 via ORAL
  Filled 2020-05-03: qty 1

## 2020-05-03 NOTE — ED Notes (Signed)
Pt has insect bite to left thigh.  Sx for 3 days.  Pt has redness and itching.  Taking benadryl without relief.  Pt alert

## 2020-05-03 NOTE — ED Provider Notes (Signed)
[facility] EMERGENCY DEPARTMENT Provider Note   CSN: [id number] Arrival date & time: 05/03/20  2110     History Chief Complaint  Patient presents with  . Insect Bite    [name] is a 54 y.o. female presents to the emergency room for evaluation of left thigh insect bite.  She states she developed a area of redness and itching several days ago that has increased in pain and itching.  She has been taking some Benadryl without relief.  She denies any fevers.  She has noticed some increase in swelling.  No drainage.  No other rashes throughout her body.  She denies any other symptoms.  HPI     Past Medical History:  Diagnosis Date  . Anxiety    PANIC ATTACKS  . Bell palsy   . 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 Chronic headaches   . Depression   . Hernia   . Hypertension   . Pancreatitis, acute   . Sinusitis   . Wrist fracture     Patient Active Problem List   Diagnosis Date Noted  . Major depressive disorder, recurrent, severe with psychotic features ([place])   . Acute gastritis 05/16/2015  . Hypertensive urgency 05/13/2015  . Facial droop 05/13/2015  . Tobacco use disorder 05/10/2015  . Cannabis use disorder, moderate, dependence ([place]) 05/10/2015  . Major depressive disorder, recurrent severe without psychotic features ([place]) 05/09/2015  . Complicated grief 41/66/0630    Past Surgical History:  Procedure Laterality Date  . ABDOMINAL HYSTERECTOMY    . CATARACT EXTRACTION W/PHACO Right 10/27/2018   Procedure: CATARACT EXTRACTION PHACO AND INTRAOCULAR LENS PLACEMENT (IOC)-RIGHT 1ST CASE PER POSTING SHEET;  Surgeon: Leandrew Koyanagi, MD;  Location: [facility] ORS;  Service: Ophthalmology;  Laterality: Right;  [place]  01:43 CDE 16.55 AP% 12.2 Fluid pack lot # [id number]  . CESAREAN SECTION       OB History    Gravida  2   Para  2   Term  2   Preterm      AB      Living  2     SAB      TAB      Ectopic      Multiple      Live Births               Family History  Problem Relation Age of Onset  . Cancer Father   . Diabetes Other     Social History   Tobacco Use  . Smoking status: Current Every Day Smoker    Packs/day: 1.00    Years: 20.00    Pack years: 20.00    Types: Cigarettes  . Smokeless tobacco: Never Used  Substance Use Topics  . Alcohol use: No  . Drug use: Yes    Types: Marijuana    Home Medications Prior to Admission medications   Medication Sig Start Date End Date Taking? Authorizing Provider  acetaminophen (TYLENOL) 325 MG tablet Take 650 mg by mouth every 6 (six) hours as needed (for pain.).    [provider]  albuterol (PROVENTIL HFA;VENTOLIN HFA) 108 (90 Base) MCG/ACT inhaler Inhale 1-2 puffs into the lungs every 6 (six) hours as needed for wheezing or shortness of breath.    [provider]  diphenhydrAMINE (BENADRYL) 25 MG tablet Take 50 mg by mouth 2 (two) times daily.    [provider]  GOODYS EXTRA STRENGTH [phone] MG PACK Take 1 packet by mouth  3 (three) times daily as needed (pain.).    [provider]  sulfamethoxazole-trimethoprim (BACTRIM DS) 800-160 MG tablet Take 1 tablet by mouth 2 (two) times daily. 05/03/20   Duanne Guess, PA-C    Allergies    Pantoprazole sodium  Review of Systems   Review of Systems  Constitutional: Negative for chills and fever.  Gastrointestinal: Negative for nausea and vomiting.  Musculoskeletal: Negative for arthralgias, back pain, joint swelling and myalgias.  Skin: Positive for rash.  Neurological: Negative for headaches.    Physical Exam Updated Vital Signs BP (!) 145/96 (BP Location: Right Arm)   Pulse 97   Temp 98.7 F (37.1 C) (Oral)   Resp 20   SpO2 97%   Physical Exam Constitutional:      Appearance: She is well-developed.  HENT:     Head: Normocephalic and atraumatic.  Eyes:     Conjunctiva/sclera: Conjunctivae normal.  Cardiovascular:     Rate and Rhythm: Normal rate.  Pulmonary:      Effort: Pulmonary effort is normal. No respiratory distress.  Musculoskeletal:        General: Normal range of motion.     Cervical back: Normal range of motion.  Skin:    General: Skin is warm.     Findings: No rash.     Comments: Left anterior proximal thigh is an area of 6 cm diameter of macular erythema, well-demarcated borders.  No drainage or fluctuance.  Slight swelling and induration.    Neurological:     General: No focal deficit present.     Mental Status: She is alert and oriented to person, place, and time.  Psychiatric:        Behavior: Behavior normal.        Thought Content: Thought content normal.     ED Results / Procedures / Treatments   Labs (all labs ordered are listed, but only abnormal results are displayed) Labs Reviewed - No data to display  EKG None  Radiology No results found.  Procedures Procedures (including critical care time)  Medications Ordered in ED Medications  sulfamethoxazole-trimethoprim (BACTRIM DS) 800-160 MG per tablet 1 tablet (1 tablet Oral Given 05/03/20 2144)    ED Course  I have reviewed the triage vital signs and the nursing notes.  Pertinent labs & imaging results that were available during my care of the patient were reviewed by me and considered in my medical decision making (see chart for details).    MDM Rules/Calculators/A&P                          54 year old female with left proximal anterior thigh erythema swelling pain and itching.  Concerning for possible cellulitis.  No signs of abscess.  No other systemic symptoms or rashes.  Patient placed on oral antibiotic will continue with Benadryl for itching.  She will continue to monitor erythema for any signs of increasing pain swelling warmth redness or any fevers.  Return to the ER for any worsening symptoms. Final Clinical Impression(s) / ED Diagnoses Final diagnoses:  Insect bite of left thigh, initial encounter  Cellulitis of left lower extremity    Rx / DC  Orders ED Discharge Orders         Ordered    sulfamethoxazole-trimethoprim (BACTRIM DS) 800-160 MG tablet  2 times daily        05/03/20 2209           Duanne Guess, [place] 05/03/20  2217    Harvest Dark, MD 05/04/20 2015

## 2020-05-03 NOTE — ED Triage Notes (Signed)
Pt c/o possible insect bite to the left upper thigh x3 days ago and today swelling and increased pain/itching to area. Area is anterior thigh with redness and swelling noted. No streaking noted. Hot to the touch.

## 2020-05-03 NOTE — Discharge Instructions (Signed)
Please continue to monitor area of redness.  Take antibiotics as prescribed.  If area of redness continues to grow or if you develop increasing pain swelling or fevers return to the ER.

## 2021-02-11 ENCOUNTER — Emergency Department: Payer: Self-pay

## 2021-02-11 ENCOUNTER — Encounter: Payer: Self-pay | Admitting: Emergency Medicine

## 2021-02-11 ENCOUNTER — Emergency Department
Admission: EM | Admit: 2021-02-11 | Discharge: 2021-02-11 | Disposition: A | Discharge status: Home / Self Care | Payer: Self-pay | Attending: Emergency Medicine | Admitting: Emergency Medicine

## 2021-02-11 ENCOUNTER — Other Ambulatory Visit: Payer: Self-pay

## 2021-02-11 DIAGNOSIS — I10 Essential (primary) hypertension: Secondary | ICD-10-CM | POA: Insufficient documentation

## 2021-02-11 DIAGNOSIS — R112 Nausea with vomiting, unspecified: Secondary | ICD-10-CM | POA: Insufficient documentation

## 2021-02-11 DIAGNOSIS — F17210 Nicotine dependence, cigarettes, uncomplicated: Secondary | ICD-10-CM | POA: Insufficient documentation

## 2021-02-11 DIAGNOSIS — Z20822 Contact with and (suspected) exposure to covid-19: Secondary | ICD-10-CM | POA: Insufficient documentation

## 2021-02-11 DIAGNOSIS — Z8544 Personal history of malignant neoplasm of other female genital organs: Secondary | ICD-10-CM | POA: Insufficient documentation

## 2021-02-11 DIAGNOSIS — J0111 Acute recurrent frontal sinusitis: Secondary | ICD-10-CM | POA: Insufficient documentation

## 2021-02-11 LAB — CBC WITH DIFFERENTIAL/PLATELET
Abs Immature Granulocytes: 0.07 10*3/uL (ref 0.00–0.07)
Basophils Absolute: 0.1 10*3/uL (ref 0.0–0.1)
Basophils Relative: 1 %
Eosinophils Absolute: 0 10*3/uL (ref 0.0–0.5)
Eosinophils Relative: 0 %
HCT: 45.6 % (ref 36.0–46.0)
Hemoglobin: 16.2 g/dL — ABNORMAL HIGH (ref 12.0–15.0)
Immature Granulocytes: 1 %
Lymphocytes Relative: 13 %
Lymphs Abs: 1.7 10*3/uL (ref 0.7–4.0)
MCH: 32.5 pg (ref 26.0–34.0)
MCHC: 35.5 g/dL (ref 30.0–36.0)
MCV: 91.4 fL (ref 80.0–100.0)
Monocytes Absolute: 0.7 10*3/uL (ref 0.1–1.0)
Monocytes Relative: 5 %
Neutro Abs: 10.1 10*3/uL — ABNORMAL HIGH (ref 1.7–7.7)
Neutrophils Relative %: 80 %
Platelets: 214 10*3/uL (ref 150–400)
RBC: 4.99 MIL/uL (ref 3.87–5.11)
RDW: 11.9 % (ref 11.5–15.5)
WBC: 12.7 10*3/uL — ABNORMAL HIGH (ref 4.0–10.5)
nRBC: 0 % (ref 0.0–0.2)

## 2021-02-11 LAB — COMPREHENSIVE METABOLIC PANEL
ALT: 15 U/L (ref 0–44)
AST: 19 U/L (ref 15–41)
Albumin: 5 g/dL (ref 3.5–5.0)
Alkaline Phosphatase: 44 U/L (ref 38–126)
Anion gap: 14 (ref 5–15)
BUN: 31 mg/dL — ABNORMAL HIGH (ref 6–20)
CO2: 23 mmol/L (ref 22–32)
Calcium: 9.9 mg/dL (ref 8.9–10.3)
Chloride: 102 mmol/L (ref 98–111)
Creatinine, Ser: 0.63 mg/dL (ref 0.44–1.00)
GFR, Estimated: >60 mL/min (ref >60)
Glucose, Bld: 148 mg/dL — ABNORMAL HIGH (ref 70–99)
Potassium: 4.1 mmol/L (ref 3.5–5.1)
Sodium: 139 mmol/L (ref 135–145)
Total Bilirubin: 1.4 mg/dL — ABNORMAL HIGH (ref 0.3–1.2)
Total Protein: 7.9 g/dL (ref 6.5–8.1)

## 2021-02-11 LAB — LIPASE, BLOOD: Lipase: 41 U/L (ref 11–51)

## 2021-02-11 LAB — RESP PANEL BY RT-PCR (FLU A&B, COVID) ARPGX2
Influenza A by PCR: NEGATIVE
Influenza B by PCR: NEGATIVE
SARS Coronavirus 2 by RT PCR: NEGATIVE

## 2021-02-11 LAB — TROPONIN I (HIGH SENSITIVITY): Troponin I (High Sensitivity): 6 ng/L (ref <18)

## 2021-02-11 MED ORDER — ONDANSETRON 4 MG PO TBDP: 4.0000 mg | ORAL_TABLET | Freq: Three times a day (TID) | ORAL | 0 refills | Status: DC | PRN

## 2021-02-11 MED ORDER — AZITHROMYCIN 250 MG PO TABS: ORAL_TABLET | ORAL | 0 refills | Status: AC

## 2021-02-11 MED ORDER — ONDANSETRON HCL 4 MG/2ML IJ SOLN
4.0000 mg | Freq: Once | INTRAMUSCULAR | Status: AC
  Administered 2021-02-11: 4 mg via INTRAVENOUS
  Filled 2021-02-11: qty 2

## 2021-02-11 MED ORDER — SODIUM CHLORIDE 0.9 % IV BOLUS
1000.0000 mL | Freq: Once | INTRAVENOUS | Status: AC
  Administered 2021-02-11: 1000 mL via INTRAVENOUS

## 2021-02-11 MED ORDER — AZITHROMYCIN 250 MG PO TABS: ORAL_TABLET | ORAL | 0 refills | Status: DC

## 2021-02-11 MED ORDER — IBUPROFEN 600 MG PO TABS
600.0000 mg | ORAL_TABLET | Freq: Once | ORAL | Status: DC
  Filled 2021-02-11: qty 1

## 2021-02-11 NOTE — ED Notes (Signed)
Pt given saltine crackers and water for PO challenge

## 2021-02-11 NOTE — ED Notes (Signed)
Pt assisted to bathroom

## 2021-02-11 NOTE — ED Triage Notes (Signed)
C/O sinus infection-- sinus pressure/ pain/ drainage 'for a while', worse past two days.  Also c/o vomiting x 2 days and intermittent chest pain 'for a while'.

## 2021-02-11 NOTE — ED Notes (Signed)
Family at bedside. 

## 2021-02-11 NOTE — ED Provider Notes (Signed)
[facility] Emergency Department Provider Note   ____________________________________________   Event Date/Time   First MD Initiated Contact with Patient 02/11/21 0725     (approximate)  I have reviewed the triage vital signs and the nursing notes.   HISTORY  Chief Complaint Emesis    HPI [name] is a 55 y.o. female who presents for sinus congestion and drainage as well as nausea vomiting  LOCATION: Head, epigastric abdomen DURATION: 7 days TIMING: Worsening since onset SEVERITY: 3/10 QUALITY: Headache, congestion, vomiting CONTEXT: Patient states that she has a history of chronic sinus infections and has had worsening nasal congestion with clear rhinorrhea that has now turned to green.   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   Chronic headaches    Depression    Hernia    Hypertension    Pancreatitis, acute    Sinusitis    Wrist fracture     Patient Active Problem List   Diagnosis Date Noted   Major depressive disorder, recurrent, severe with psychotic features ([place])    Acute gastritis 05/16/2015   Hypertensive urgency 05/13/2015   Facial droop 05/13/2015   Tobacco use disorder 05/10/2015   Cannabis use disorder, moderate, dependence ([place]) 05/10/2015   Major depressive disorder, recurrent severe without psychotic features ([place]) 66/44/0347   Complicated grief 42/59/5638    Past Surgical History:  Procedure Laterality Date   ABDOMINAL HYSTERECTOMY     CATARACT EXTRACTION W/PHACO Right 10/27/2018   Procedure: CATARACT EXTRACTION PHACO AND INTRAOCULAR LENS PLACEMENT (IOC)-RIGHT 1ST CASE PER POSTING SHEET;  Surgeon: Leandrew Koyanagi, MD;  Location: [facility]  ORS;  Service: Ophthalmology;  Laterality: Right;  [place]  01:43 CDE 16.55 AP% 12.2 Fluid pack lot # [id number]   CESAREAN SECTION      Prior to Admission medications   Medication Sig Start Date End Date Taking? Authorizing Provider  azithromycin (ZITHROMAX Z-PAK) 250 MG tablet Take 2 tablets (500 mg) on  Day 1,  followed by 1 tablet (250 mg) once daily on Days 2 through 5. 02/11/21 02/16/21 Yes Jasiyah Poland, Vista Lawman, MD  ondansetron (ZOFRAN ODT) 4 MG disintegrating tablet Take 1 tablet (4 mg total) by mouth every 8 (eight) hours as needed for nausea or vomiting. 02/11/21  Yes Naaman Plummer, MD  acetaminophen (TYLENOL) 325 MG tablet Take 650 mg by mouth every 6 (six) hours as needed (for pain.).    [provider]  albuterol (PROVENTIL HFA;VENTOLIN HFA) 108 (90 Base) MCG/ACT inhaler Inhale 1-2 puffs into the lungs every 6 (six) hours as needed for wheezing or shortness of breath.    [provider]  diphenhydrAMINE (BENADRYL) 25 MG tablet Take 50 mg by mouth 2 (two) times daily.    [provider]  GOODYS EXTRA STRENGTH [phone] MG PACK Take 1 packet by mouth 3 (three) times daily as needed (pain.).    [provider]  sulfamethoxazole-trimethoprim (BACTRIM DS) 800-160 MG tablet Take 1 tablet by mouth 2 (two) times daily. 05/03/20   Duanne Guess, PA-C    Allergies Pantoprazole sodium  Family History  Problem Relation Age of Onset   Cancer Father  Diabetes Other     Social History Social History   Tobacco Use   Smoking status: Every Day    Packs/day: 1.00    Years: 20.00    Pack years: 20.00    Types: Cigarettes   Smokeless tobacco: Never  Substance Use Topics   Alcohol use: No   Drug use: Yes    Types: Marijuana    Review of Systems Constitutional: Endorses fever/chills Eyes: No visual changes. ENT: No sore throat. Cardiovascular: Denies chest pain. Respiratory: Denies shortness of breath. Gastrointestinal: Endorses midepigastric pain,  nausea, and vomiting.  No diarrhea. Genitourinary: Negative for dysuria. Musculoskeletal: Negative for acute arthralgias Skin: Negative for rash. Neurological: Endorses bilateral frontal headaches, negative for weakness/numbness/paresthesias in any extremity Psychiatric: Negative for suicidal ideation/homicidal ideation   ____________________________________________   PHYSICAL EXAM:  VITAL SIGNS: ED Triage Vitals  Enc Vitals Group     BP 02/11/21 0729 (!) 169/109     Pulse Rate 02/11/21 0729 (!) 107     Resp 02/11/21 0729 20     Temp 02/11/21 0729 98.8 F (37.1 C)     Temp Source 02/11/21 0729 Oral     SpO2 02/11/21 0729 99 %     Weight 02/11/21 0727 162 lb 14.7 oz (73.9 kg)     Height 02/11/21 0727 5\' 6"  (1.676 m)     Head Circumference --      Peak Flow --      Pain Score 02/11/21 0727 3     Pain Loc --      Pain Edu? --      Excl. in [place]? --    Constitutional: Alert and oriented. Well appearing and in no acute distress. Eyes: Conjunctivae are normal. PERRL. Head: Atraumatic.  Tenderness with palpation over the frontal sinuses Nose: No congestion/rhinnorhea. Mouth/Throat: Mucous membranes are moist. Neck: No stridor Cardiovascular: Grossly normal heart sounds.  Good peripheral circulation. Respiratory: Normal respiratory effort.  No retractions. Gastrointestinal: Soft and nontender. No distention. Musculoskeletal: No obvious deformities Neurologic:  Normal speech and language. No gross focal neurologic deficits are appreciated. Skin:  Skin is warm and dry. No rash noted. Psychiatric: Mood and affect are normal. Speech and behavior are normal.  ____________________________________________   LABS (all labs ordered are listed, but only abnormal results are displayed)  Labs Reviewed  COMPREHENSIVE METABOLIC PANEL - Abnormal; Notable for the following components:      Result Value   Glucose, Bld 148 (*)    BUN 31 (*)    Total Bilirubin 1.4 (*)    All other  components within normal limits  CBC WITH DIFFERENTIAL/PLATELET - Abnormal; Notable for the following components:   WBC 12.7 (*)    Hemoglobin 16.2 (*)    Neutro Abs 10.1 (*)    All other components within normal limits  RESP PANEL BY RT-PCR (FLU A&B, COVID) ARPGX2  LIPASE, BLOOD  TROPONIN I (HIGH SENSITIVITY)   ____________________________________________  EKG  ED ECG REPORT I, Naaman Plummer, the attending physician, personally viewed and interpreted this ECG.  Date: 02/11/2021 EKG Time: 0732 Rate: 104 Rhythm: normal sinus rhythm QRS Axis: normal Intervals: normal ST/T Wave abnormalities: normal Narrative Interpretation: no evidence of acute ischemia  ____________________________________________  RADIOLOGY  ED MD interpretation: One-view portable chest x-ray shows no evidence of acute abnormalities including no pneumonia, pneumothorax, or widened mediastinum  Official radiology report(s): DG Chest Port 1 View  Result Date: 02/11/2021 CLINICAL DATA:  Chest pain. Sinus infection. Vomiting for 2 days. Intermittent chest  pain. EXAM: PORTABLE CHEST 1 VIEW COMPARISON:  11/21/2018 FINDINGS: Heart size is normal. The lungs are free of focal consolidations and pleural effusions. No pulmonary edema. IMPRESSION: No active disease. Electronically Signed   By: Nolon Nations M.D.   On: 02/11/2021 08:01    ____________________________________________   PROCEDURES  Procedure(s) performed (including Critical Care):  .1-3 Lead EKG Interpretation  Date/Time: 02/11/2021 10:25 AM Performed by: Naaman Plummer, MD Authorized by: Naaman Plummer, MD     Interpretation: normal     ECG rate:  71   ECG rate assessment: normal     Rhythm: sinus rhythm     Ectopy: none     Conduction: normal     ____________________________________________   INITIAL IMPRESSION / ASSESSMENT AND PLAN / ED COURSE  As part of my medical decision making, I reviewed the following data within the  electronic medical record, if available:  Nursing notes reviewed and incorporated, Labs reviewed, EKG interpreted, Old chart reviewed, Radiograph reviewed and Notes from prior ED visits reviewed and incorporated      Otherwise healthy patient presenting with constellation of symptoms likely representing uncomplicated viral upper respiratory symptoms as characterized by mild pharyngitis  Unlikely PTA/RPA: no hot potato voice, no uvular deviation, Unlikely Esophageal rupture: No history of dysphagia Unlikely deep space infection/Lud[place]igs Low suspicion for CNS infection bacterial sinusitis, or pneumonia given exam and history.  Unlikely Strep or EBV as centor negative and with no pharyngeal exudate, posterior LAD, or splenomegaly. P.o. tolerant This patient has had symptoms for over 7 days and has a significant history for recurrent bacterial sinusitis, will prescribe antibiotics empirically for worsening symptoms. No respiratory distress, otherwise relatively well appearing and nontoxic. Will discuss prompt follow up with PMD and strict return precautions.      ____________________________________________   FINAL CLINICAL IMPRESSION(S) / ED DIAGNOSES  Final diagnoses:  Acute recurrent frontal sinusitis  Non-intractable vomiting with nausea, unspecified vomiting type     ED Discharge Orders          Ordered    ondansetron (ZOFRAN ODT) 4 MG disintegrating tablet  Every 8 hours PRN        02/11/21 1023    azithromycin (ZITHROMAX Z-PAK) 250 MG tablet        02/11/21 1023             Note:  This document was prepared using Dragon voice recognition software and may include unintentional dictation errors.    Naaman Plummer, MD 02/11/21 1025

## 2021-04-19 ENCOUNTER — Encounter: Payer: Self-pay | Admitting: Ophthalmology

## 2021-04-19 ENCOUNTER — Other Ambulatory Visit: Payer: Self-pay

## 2021-04-26 NOTE — Discharge Instructions (Signed)

## 2021-04-30 ENCOUNTER — Ambulatory Visit
Admission: RE | Admit: 2021-04-30 | Discharge: 2021-04-30 | Disposition: A | Discharge status: Home / Self Care | Attending: Ophthalmology | Admitting: Ophthalmology

## 2021-04-30 ENCOUNTER — Encounter: Payer: Self-pay | Admitting: Ophthalmology

## 2021-04-30 ENCOUNTER — Ambulatory Visit: Admitting: Anesthesiology

## 2021-04-30 ENCOUNTER — Encounter
Admission: RE | Disposition: A | Discharge status: Home / Self Care | Payer: Self-pay | Source: Home / Self Care | Attending: Ophthalmology

## 2021-04-30 ENCOUNTER — Other Ambulatory Visit: Payer: Self-pay

## 2021-04-30 DIAGNOSIS — Z888 Allergy status to other drugs, medicaments and biological substances status: Secondary | ICD-10-CM | POA: Insufficient documentation

## 2021-04-30 DIAGNOSIS — H2512 Age-related nuclear cataract, left eye: Secondary | ICD-10-CM | POA: Diagnosis present

## 2021-04-30 DIAGNOSIS — Z8544 Personal history of malignant neoplasm of other female genital organs: Secondary | ICD-10-CM | POA: Insufficient documentation

## 2021-04-30 DIAGNOSIS — F17210 Nicotine dependence, cigarettes, uncomplicated: Secondary | ICD-10-CM | POA: Diagnosis not present

## 2021-04-30 HISTORY — PX: CATARACT EXTRACTION W/PHACO: SHX586

## 2021-04-30 SURGERY — PHACOEMULSIFICATION, CATARACT, WITH IOL INSERTION
Anesthesia: Monitor Anesthesia Care | Site: Eye | Laterality: Left

## 2021-04-30 MED ORDER — SIGHTPATH DOSE#1 BSS IO SOLN
INTRAOCULAR | Status: DC | PRN
  Administered 2021-04-30: 15 mL

## 2021-04-30 MED ORDER — MOXIFLOXACIN HCL 0.5 % OP SOLN
OPHTHALMIC | Status: DC | PRN
  Administered 2021-04-30: 0.2 mL via OPHTHALMIC

## 2021-04-30 MED ORDER — SIGHTPATH DOSE#1 BSS IO SOLN
INTRAOCULAR | Status: DC | PRN
  Administered 2021-04-30: 1 mL via INTRAMUSCULAR

## 2021-04-30 MED ORDER — MIDAZOLAM HCL 2 MG/2ML IJ SOLN
INTRAMUSCULAR | Status: DC | PRN
  Administered 2021-04-30: 2 mg via INTRAVENOUS

## 2021-04-30 MED ORDER — SIGHTPATH DOSE#1 NA CHONDROIT SULF-NA HYALURON 40-17 MG/ML IO SOLN
INTRAOCULAR | Status: DC | PRN
  Administered 2021-04-30: 1 mL via INTRAOCULAR

## 2021-04-30 MED ORDER — BRIMONIDINE TARTRATE-TIMOLOL 0.2-0.5 % OP SOLN
OPHTHALMIC | Status: DC | PRN
  Administered 2021-04-30: 1 [drp] via OPHTHALMIC

## 2021-04-30 MED ORDER — SIGHTPATH DOSE#1 BSS IO SOLN
INTRAOCULAR | Status: DC | PRN
  Administered 2021-04-30: 57 mL via OPHTHALMIC

## 2021-04-30 MED ORDER — FENTANYL CITRATE (PF) 100 MCG/2ML IJ SOLN
INTRAMUSCULAR | Status: DC | PRN
  Administered 2021-04-30 (×2): 50 ug via INTRAVENOUS

## 2021-04-30 MED ORDER — TETRACAINE HCL 0.5 % OP SOLN
1.0000 [drp] | OPHTHALMIC | Status: DC | PRN
  Administered 2021-04-30 (×3): 1 [drp] via OPHTHALMIC

## 2021-04-30 MED ORDER — ARMC OPHTHALMIC DILATING DROPS
1.0000 "application " | OPHTHALMIC | Status: DC | PRN
  Administered 2021-04-30 (×3): 1 via OPHTHALMIC

## 2021-04-30 SURGICAL SUPPLY — 15 items
CANNULA ANT/CHMB 27GA (MISCELLANEOUS) ×4 IMPLANT
GLOVE SURG ENC TEXT LTX SZ8 (GLOVE) ×2 IMPLANT
GLOVE SURG TRIUMPH 8.0 PF LTX (GLOVE) ×2 IMPLANT
GOWN STRL REUS W/ TWL LRG LVL3 (GOWN DISPOSABLE) ×2 IMPLANT
GOWN STRL REUS W/TWL LRG LVL3 (GOWN DISPOSABLE) ×4
LENS DEVICE MILOOP (INTRAOCULAR LENS) ×2 IMPLANT
LENS IOL TECNIS EYHANCE 19.5 (Intraocular Lens) ×2 IMPLANT
MARKER SKIN DUAL TIP RULER LAB (MISCELLANEOUS) ×2 IMPLANT
NEEDLE FILTER BLUNT 18X 1/2SAF (NEEDLE) ×1
NEEDLE FILTER BLUNT 18X1 1/2 (NEEDLE) ×1 IMPLANT
PACK EYE AFTER SURG (MISCELLANEOUS) ×2 IMPLANT
SYR 3ML LL SCALE MARK (SYRINGE) ×2 IMPLANT
SYR TB 1ML LUER SLIP (SYRINGE) ×2 IMPLANT
WATER STERILE IRR 250ML POUR (IV SOLUTION) ×2 IMPLANT
WIPE NON LINTING 3.25X3.25 (MISCELLANEOUS) ×2 IMPLANT

## 2021-04-30 NOTE — Anesthesia Procedure Notes (Signed)
Procedure Name: MAC Date/Time: 04/30/2021 1:05 PM Performed by: Mayme Genta, CRNA Pre-anesthesia Checklist: Patient identified, Emergency Drugs available, Suction available, Timeout performed and Patient being monitored Patient Re-evaluated:Patient Re-evaluated prior to induction Oxygen Delivery Method: Nasal cannula Placement Confirmation: positive ETCO2

## 2021-04-30 NOTE — Anesthesia Postprocedure Evaluation (Signed)
Anesthesia Post Note  Patient: [name]  Procedure(s) Performed: CATARACT EXTRACTION PHACO AND INTRAOCULAR LENS PLACEMENT (IOC) LEFT VISION BLUE [place] (Left: Eye)     Patient location during evaluation: PACU Anesthesia Type: MAC Level of consciousness: awake and alert Pain management: pain level controlled Vital Signs Assessment: post-procedure vital signs reviewed and stable Respiratory status: spontaneous breathing Cardiovascular status: stable Anesthetic complications: no   No notable events documented.  Gillian Scarce

## 2021-04-30 NOTE — H&P (Signed)
[facility]   Primary Care Physician:  Patient, No Pcp Per (Inactive) Ophthalmologist: Dr. George Ina  Pre-Procedure History & Physical: HPI:  [name] is a 55 y.o. female here for cataract surgery.   Past Medical History:  Diagnosis Date   Anxiety    PANIC ATTACKS   Bell palsy    Cancer ([facility])    VULVAR   Chronic headaches    Depression    Hernia    Hypertension    Pancreatitis, acute    Sinusitis    Wrist fracture     Past Surgical History:  Procedure Laterality Date   ABDOMINAL HYSTERECTOMY     CATARACT EXTRACTION W/PHACO Right 10/27/2018   Procedure: CATARACT EXTRACTION PHACO AND INTRAOCULAR LENS PLACEMENT (IOC)-RIGHT 1ST CASE PER POSTING SHEET;  Surgeon: Leandrew Koyanagi, MD;  Location: [facility] ORS;  Service: Ophthalmology;  Laterality: Right;  [place]  01:43 CDE 16.55 AP% 12.2 Fluid pack lot # [id number]   CESAREAN SECTION      Prior to Admission medications   Medication Sig Start Date End Date Taking? Authorizing Provider  albuterol (PROVENTIL HFA;VENTOLIN HFA) 108 (90 Base) MCG/ACT inhaler Inhale 1-2 puffs into the lungs every 6 (six) hours as needed for wheezing or shortness of breath.   Yes [provider]  diphenhydrAMINE (BENADRYL) 25 MG tablet Take 50 mg by mouth 2 (two) times daily.   Yes [provider]  GOODYS EXTRA STRENGTH [phone] MG PACK Take 1 packet by mouth 3 (three) times daily as needed (pain.).   Yes [provider]  acetaminophen (TYLENOL) 325 MG tablet Take 650 mg by mouth every 6 (six) hours as needed (for pain.). Patient not taking: Reported on 04/19/2021    [provider]  ondansetron (ZOFRAN ODT) 4 MG disintegrating tablet Take 1 tablet (4 mg total) by mouth every 8 (eight) hours as needed for nausea or vomiting. Patient not taking: Reported on 04/19/2021 02/11/21   Naaman Plummer, MD  sulfamethoxazole-trimethoprim (BACTRIM DS) 800-160 MG tablet Take 1 tablet by mouth 2 (two) times daily. Patient not  taking: Reported on 04/17/2021 05/03/20   Duanne Guess, PA-C    Allergies as of 04/17/2021 - Review Complete 02/11/2021  Allergen Reaction Noted   Pantoprazole sodium Other (See Comments) 05/15/2015    Family History  Problem Relation Age of Onset   Cancer Father    Diabetes Other     Social History   Socioeconomic History   Marital status: Married    Spouse name: Not on file   Number of children: Not on file   Years of education: Not on file   Highest education level: Not on file  Occupational History   Not on file  Tobacco Use   Smoking status: Every Day    Packs/day: 1.00    Years: 20.00    Pack years: 20.00    Types: Cigarettes   Smokeless tobacco: Never  Substance and Sexual Activity   Alcohol use: No   Drug use: Yes    Types: Marijuana   Sexual activity: Never  Other Topics Concern   Not on file  Social History Narrative   Not on file   Social Determinants of Health   Financial Resource Strain: Not on file  Food Insecurity: Not on file  Transportation Needs: Not on file  Physical Activity: Not on file  Stress: Not on file  Social Connections: Not on file  Intimate Partner Violence: Not on file    Review of Systems: See  HPI, otherwise negative ROS  Physical Exam: BP (!) 153/97   Pulse 86   Temp 98.1 F (36.7 C) (Temporal)   Resp 20   Ht '5\' 6"'$  (1.676 m)   Wt 75.8 kg   SpO2 100%   BMI 26.95 kg/m  General:   Alert, cooperative in NAD Head:  Normocephalic and atraumatic. Respiratory:  Normal work of breathing. Cardiovascular:  RRR  Impression/Plan: [name] is here for cataract surgery.  Risks, benefits, limitations, and alternatives regarding cataract surgery have been reviewed with the patient.  Questions have been answered.  All parties agreeable.   Birder Robson, MD  04/30/2021, 12:53 PM

## 2021-04-30 NOTE — Transfer of Care (Signed)
Immediate Anesthesia Transfer of Care Note  Patient: [name]  Procedure(s) Performed: CATARACT EXTRACTION PHACO AND INTRAOCULAR LENS PLACEMENT ([place]) LEFT VISION BLUE [place] (Left: Eye)  Patient Location: PACU  Anesthesia Type: MAC  Level of Consciousness: awake, alert  and patient cooperative  Airway and Oxygen Therapy: Patient Spontanous Breathing and Patient connected to supplemental oxygen  Post-op Assessment: Post-op Vital signs reviewed, Patient's Cardiovascular Status Stable, Respiratory Function Stable, Patent Airway and No signs of Nausea or vomiting  Post-op Vital Signs: Reviewed and stable  Complications: No notable events documented.

## 2021-04-30 NOTE — Op Note (Signed)
PREOPERATIVE DIAGNOSIS:  Nuclear sclerotic cataract of the left eye.   POSTOPERATIVE DIAGNOSIS:  Nuclear sclerotic cataract of the left eye.   OPERATIVE PROCEDURE:ORPROCALL@   SURGEON:  Birder Robson, MD.   ANESTHESIA:  Anesthesiologist: Elgie Collard, MD CRNA: Mayme Genta, CRNA  1.      Managed anesthesia care. 2.     0.28m of Shugarcaine was instilled following the paracentesis   COMPLICATIONS: Vision Blue was used to stain the anterior capsule due to very poor/ no visualization of the red reflex. The MiLoop device was used to better divide the lens.   TECHNIQUE:   Stop and chop   DESCRIPTION OF PROCEDURE:  The patient was examined and consented in the preoperative holding area where the aforementioned topical anesthesia was applied to the left eye and then brought back to the Operating Room where the left eye was prepped and draped in the usual sterile ophthalmic fashion and a lid speculum was placed. A paracentesis was created with the side port blade and the anterior chamber was filled with viscoelastic. A near clear corneal incision was performed with the steel keratome. A continuous curvilinear capsulorrhexis was performed with a cystotome followed by the capsulorrhexis forceps. Hydrodissection and hydrodelineation were carried out with BSS on a blunt cannula. The lens was removed in a stop and chop  technique and the remaining cortical material was removed with the irrigation-aspiration handpiece. The capsular bag was inflated with viscoelastic and the Technis [id number] lens was placed in the capsular bag without complication. The remaining viscoelastic was removed from the eye with the irrigation-aspiration handpiece. The wounds were hydrated. The anterior chamber was flushed with BSS and the eye was inflated to physiologic pressure. 0.132mVigamox was placed in the anterior chamber. The wounds were found to be water tight. The eye was dressed with Combigan. The patient was given  protective glasses to wear throughout the day and a shield with which to sleep tonight. The patient was also given drops with which to begin a drop regimen today and will follow-up with me in one day. Implant Name Type Inv. Item Serial No. Manufacturer Lot No. LRB No. Used Action  LENS IOL TECNIS EYHANCE 19.5 - [id number] Lens LENS IOL TECNIS EYHANCE 19.5 [id number]   Left 1 Implanted    Procedure(s) with comments: CATARACT EXTRACTION PHACO AND INTRAOCULAR LENS PLACEMENT (IOC) LEFT VISION BLUE [place] (Left) - 17.19 01:14.6  Electronically signed: WiBirder Robson/13/2022 1:26 PM

## 2021-04-30 NOTE — Anesthesia Preprocedure Evaluation (Addendum)
Anesthesia Evaluation  Patient identified by MRN, date of birth, ID band Patient awake    Reviewed: Allergy & Precautions, H&P , NPO status , Patient's Chart, lab work & pertinent test results  Airway Mallampati: II  TM Distance: >3 FB Neck ROM: full    Dental no notable dental hx.    Pulmonary Current Smoker,    Pulmonary exam normal        Cardiovascular hypertension, Normal cardiovascular exam Rhythm:regular Rate:Normal     Neuro/Psych  Headaches, Anxiety    GI/Hepatic negative GI ROS, Neg liver ROS,   Endo/Other  negative endocrine ROS  Renal/GU negative Renal ROS  negative genitourinary   Musculoskeletal   Abdominal   Peds  Hematology negative hematology ROS (+)   Anesthesia Other Findings   Reproductive/Obstetrics                            Anesthesia Physical Anesthesia Plan  ASA: 2  Anesthesia Plan: MAC   Post-op Pain Management:    Induction:   PONV Risk Score and Plan: 1 and TIVA and Midazolam  Airway Management Planned:   Additional Equipment:   Intra-op Plan:   Post-operative Plan:   Informed Consent: I have reviewed the patients History and Physical, chart, labs and discussed the procedure including the risks, benefits and alternatives for the proposed anesthesia with the patient or authorized representative who has indicated his/her understanding and acceptance.       Plan Discussed with:   Anesthesia Plan Comments:         Anesthesia Quick Evaluation

## 2021-05-01 ENCOUNTER — Encounter: Payer: Self-pay | Admitting: Ophthalmology

## 2021-08-16 ENCOUNTER — Emergency Department: Payer: Self-pay

## 2021-08-16 ENCOUNTER — Inpatient Hospital Stay
Admission: EM | Admit: 2021-08-16 | Discharge: 2021-08-18 | DRG: 872 | Disposition: A | Discharge status: Home / Self Care | Payer: PRIVATE HEALTH INSURANCE | Attending: Family Medicine | Admitting: Family Medicine

## 2021-08-16 ENCOUNTER — Other Ambulatory Visit: Payer: Self-pay

## 2021-08-16 ENCOUNTER — Encounter: Payer: Self-pay | Admitting: Emergency Medicine

## 2021-08-16 DIAGNOSIS — R9431 Abnormal electrocardiogram [ECG] [EKG]: Secondary | ICD-10-CM

## 2021-08-16 DIAGNOSIS — F418 Other specified anxiety disorders: Secondary | ICD-10-CM | POA: Diagnosis present

## 2021-08-16 DIAGNOSIS — Z888 Allergy status to other drugs, medicaments and biological substances status: Secondary | ICD-10-CM

## 2021-08-16 DIAGNOSIS — Z9841 Cataract extraction status, right eye: Secondary | ICD-10-CM

## 2021-08-16 DIAGNOSIS — J101 Influenza due to other identified influenza virus with other respiratory manifestations: Secondary | ICD-10-CM | POA: Diagnosis present

## 2021-08-16 DIAGNOSIS — R7989 Other specified abnormal findings of blood chemistry: Secondary | ICD-10-CM | POA: Diagnosis present

## 2021-08-16 DIAGNOSIS — E872 Acidosis, unspecified: Secondary | ICD-10-CM | POA: Diagnosis present

## 2021-08-16 DIAGNOSIS — I10 Essential (primary) hypertension: Secondary | ICD-10-CM | POA: Diagnosis present

## 2021-08-16 DIAGNOSIS — R197 Diarrhea, unspecified: Secondary | ICD-10-CM

## 2021-08-16 DIAGNOSIS — Z79899 Other long term (current) drug therapy: Secondary | ICD-10-CM

## 2021-08-16 DIAGNOSIS — Z961 Presence of intraocular lens: Secondary | ICD-10-CM | POA: Diagnosis present

## 2021-08-16 DIAGNOSIS — K921 Melena: Secondary | ICD-10-CM

## 2021-08-16 DIAGNOSIS — R7303 Prediabetes: Secondary | ICD-10-CM | POA: Diagnosis present

## 2021-08-16 DIAGNOSIS — D3501 Benign neoplasm of right adrenal gland: Secondary | ICD-10-CM

## 2021-08-16 DIAGNOSIS — R6510 Systemic inflammatory response syndrome (SIRS) of non-infectious origin without acute organ dysfunction: Secondary | ICD-10-CM

## 2021-08-16 DIAGNOSIS — E876 Hypokalemia: Secondary | ICD-10-CM | POA: Diagnosis present

## 2021-08-16 DIAGNOSIS — K625 Hemorrhage of anus and rectum: Secondary | ICD-10-CM | POA: Diagnosis present

## 2021-08-16 DIAGNOSIS — A419 Sepsis, unspecified organism: Principal | ICD-10-CM | POA: Diagnosis present

## 2021-08-16 DIAGNOSIS — K219 Gastro-esophageal reflux disease without esophagitis: Secondary | ICD-10-CM | POA: Diagnosis present

## 2021-08-16 DIAGNOSIS — I248 Other forms of acute ischemic heart disease: Secondary | ICD-10-CM | POA: Diagnosis present

## 2021-08-16 DIAGNOSIS — E861 Hypovolemia: Secondary | ICD-10-CM | POA: Diagnosis present

## 2021-08-16 DIAGNOSIS — F410 Panic disorder [episodic paroxysmal anxiety] without agoraphobia: Secondary | ICD-10-CM | POA: Diagnosis present

## 2021-08-16 DIAGNOSIS — R079 Chest pain, unspecified: Secondary | ICD-10-CM | POA: Diagnosis not present

## 2021-08-16 DIAGNOSIS — R739 Hyperglycemia, unspecified: Secondary | ICD-10-CM | POA: Diagnosis present

## 2021-08-16 DIAGNOSIS — R778 Other specified abnormalities of plasma proteins: Secondary | ICD-10-CM

## 2021-08-16 DIAGNOSIS — Z9842 Cataract extraction status, left eye: Secondary | ICD-10-CM

## 2021-08-16 DIAGNOSIS — F17210 Nicotine dependence, cigarettes, uncomplicated: Secondary | ICD-10-CM | POA: Diagnosis present

## 2021-08-16 DIAGNOSIS — E860 Dehydration: Secondary | ICD-10-CM | POA: Diagnosis present

## 2021-08-16 DIAGNOSIS — Z8589 Personal history of malignant neoplasm of other organs and systems: Secondary | ICD-10-CM

## 2021-08-16 DIAGNOSIS — R112 Nausea with vomiting, unspecified: Secondary | ICD-10-CM | POA: Diagnosis present

## 2021-08-16 DIAGNOSIS — N179 Acute kidney failure, unspecified: Secondary | ICD-10-CM | POA: Diagnosis present

## 2021-08-16 DIAGNOSIS — K922 Gastrointestinal hemorrhage, unspecified: Secondary | ICD-10-CM

## 2021-08-16 DIAGNOSIS — Z90710 Acquired absence of both cervix and uterus: Secondary | ICD-10-CM

## 2021-08-16 DIAGNOSIS — E669 Obesity, unspecified: Secondary | ICD-10-CM | POA: Diagnosis present

## 2021-08-16 DIAGNOSIS — Z8544 Personal history of malignant neoplasm of other female genital organs: Secondary | ICD-10-CM

## 2021-08-16 DIAGNOSIS — F17200 Nicotine dependence, unspecified, uncomplicated: Secondary | ICD-10-CM | POA: Diagnosis present

## 2021-08-16 DIAGNOSIS — Z20822 Contact with and (suspected) exposure to covid-19: Secondary | ICD-10-CM | POA: Diagnosis present

## 2021-08-16 DIAGNOSIS — Z6826 Body mass index (BMI) 26.0-26.9, adult: Secondary | ICD-10-CM

## 2021-08-16 DIAGNOSIS — F32A Depression, unspecified: Secondary | ICD-10-CM | POA: Diagnosis present

## 2021-08-16 DIAGNOSIS — K559 Vascular disorder of intestine, unspecified: Secondary | ICD-10-CM | POA: Diagnosis present

## 2021-08-16 HISTORY — DX: Sepsis, unspecified organism: A41.9

## 2021-08-16 LAB — BASIC METABOLIC PANEL
Anion gap: 14 (ref 5–15)
BUN: 36 mg/dL — ABNORMAL HIGH (ref 6–20)
CO2: 27 mmol/L (ref 22–32)
Calcium: 9 mg/dL (ref 8.9–10.3)
Chloride: 91 mmol/L — ABNORMAL LOW (ref 98–111)
Creatinine, Ser: 1.12 mg/dL — ABNORMAL HIGH (ref 0.44–1.00)
GFR, Estimated: 58 mL/min — ABNORMAL LOW (ref >60)
Glucose, Bld: 282 mg/dL — ABNORMAL HIGH (ref 70–99)
Potassium: 3.5 mmol/L (ref 3.5–5.1)
Sodium: 132 mmol/L — ABNORMAL LOW (ref 135–145)

## 2021-08-16 LAB — CBC
HCT: 44.5 % (ref 36.0–46.0)
HCT: 39 % (ref 36.0–46.0)
HCT: 33.2 % — ABNORMAL LOW (ref 36.0–46.0)
Hemoglobin: 15.4 g/dL — ABNORMAL HIGH (ref 12.0–15.0)
Hemoglobin: 13.3 g/dL (ref 12.0–15.0)
Hemoglobin: 11.4 g/dL — ABNORMAL LOW (ref 12.0–15.0)
MCH: 31.8 pg (ref 26.0–34.0)
MCH: 32 pg (ref 26.0–34.0)
MCH: 32.9 pg (ref 26.0–34.0)
MCHC: 34.6 g/dL (ref 30.0–36.0)
MCHC: 34.1 g/dL (ref 30.0–36.0)
MCHC: 34.3 g/dL (ref 30.0–36.0)
MCV: 91.8 fL (ref 80.0–100.0)
MCV: 93.8 fL (ref 80.0–100.0)
MCV: 95.7 fL (ref 80.0–100.0)
Platelets: 181 10*3/uL (ref 150–400)
Platelets: 141 10*3/uL — ABNORMAL LOW (ref 150–400)
Platelets: 118 10*3/uL — ABNORMAL LOW (ref 150–400)
RBC: 4.85 MIL/uL (ref 3.87–5.11)
RBC: 4.16 MIL/uL (ref 3.87–5.11)
RBC: 3.47 MIL/uL — ABNORMAL LOW (ref 3.87–5.11)
RDW: 12.5 % (ref 11.5–15.5)
RDW: 12.5 % (ref 11.5–15.5)
RDW: 12.7 % (ref 11.5–15.5)
WBC: 12.2 10*3/uL — ABNORMAL HIGH (ref 4.0–10.5)
WBC: 11.4 10*3/uL — ABNORMAL HIGH (ref 4.0–10.5)
WBC: 9.8 10*3/uL (ref 4.0–10.5)
nRBC: 0 % (ref 0.0–0.2)
nRBC: 0 % (ref 0.0–0.2)
nRBC: 0 % (ref 0.0–0.2)

## 2021-08-16 LAB — HEPATIC FUNCTION PANEL
ALT: 32 U/L (ref 0–44)
AST: 33 U/L (ref 15–41)
Albumin: 4 g/dL (ref 3.5–5.0)
Alkaline Phosphatase: 45 U/L (ref 38–126)
Bilirubin, Direct: 0.2 mg/dL (ref 0.0–0.2)
Indirect Bilirubin: 1.2 mg/dL — ABNORMAL HIGH (ref 0.3–0.9)
Total Bilirubin: 1.4 mg/dL — ABNORMAL HIGH (ref 0.3–1.2)
Total Protein: 6.6 g/dL (ref 6.5–8.1)

## 2021-08-16 LAB — URINALYSIS, COMPLETE (UACMP) WITH MICROSCOPIC
Bilirubin Urine: NEGATIVE
Glucose, UA: 50 mg/dL — AB
Ketones, ur: 5 mg/dL — AB
Leukocytes,Ua: NEGATIVE
Nitrite: NEGATIVE
Protein, ur: 30 mg/dL — AB
Specific Gravity, Urine: 1.029 (ref 1.005–1.030)
pH: 7 (ref 5.0–8.0)

## 2021-08-16 LAB — CBG MONITORING, ED
Glucose-Capillary: 130 mg/dL — ABNORMAL HIGH (ref 70–99)
Glucose-Capillary: 132 mg/dL — ABNORMAL HIGH (ref 70–99)
Glucose-Capillary: 141 mg/dL — ABNORMAL HIGH (ref 70–99)
Glucose-Capillary: 128 mg/dL — ABNORMAL HIGH (ref 70–99)

## 2021-08-16 LAB — APTT: aPTT: 24 seconds (ref 24–36)

## 2021-08-16 LAB — URINE DRUG SCREEN, QUALITATIVE (ARMC ONLY)
Amphetamines, Ur Screen: NOT DETECTED
Barbiturates, Ur Screen: NOT DETECTED
Benzodiazepine, Ur Scrn: POSITIVE — AB
Cannabinoid 50 Ng, Ur ~~LOC~~: POSITIVE — AB
Cocaine Metabolite,Ur ~~LOC~~: NOT DETECTED
MDMA (Ecstasy)Ur Screen: NOT DETECTED
Methadone Scn, Ur: NOT DETECTED
Opiate, Ur Screen: NOT DETECTED
Phencyclidine (PCP) Ur S: NOT DETECTED
Tricyclic, Ur Screen: NOT DETECTED

## 2021-08-16 LAB — MAGNESIUM: Magnesium: 1.8 mg/dL (ref 1.7–2.4)

## 2021-08-16 LAB — LACTIC ACID, PLASMA
Lactic Acid, Venous: 3.9 mmol/L (ref 0.5–1.9)
Lactic Acid, Venous: 1.9 mmol/L (ref 0.5–1.9)

## 2021-08-16 LAB — PROTIME-INR
INR: 1 (ref 0.8–1.2)
Prothrombin Time: 13.4 seconds (ref 11.4–15.2)

## 2021-08-16 LAB — TYPE AND SCREEN
ABO/RH(D): O POS
Antibody Screen: NEGATIVE

## 2021-08-16 LAB — PROCALCITONIN: Procalcitonin: <0.1 ng/mL

## 2021-08-16 LAB — HEMOGLOBIN A1C
Hgb A1c MFr Bld: 6.3 % — ABNORMAL HIGH (ref 4.8–5.6)
Mean Plasma Glucose: 134.11 mg/dL

## 2021-08-16 LAB — RESP PANEL BY RT-PCR (FLU A&B, COVID) ARPGX2
Influenza A by PCR: POSITIVE — AB
Influenza B by PCR: NEGATIVE
SARS Coronavirus 2 by RT PCR: NEGATIVE

## 2021-08-16 LAB — TROPONIN I (HIGH SENSITIVITY)
Troponin I (High Sensitivity): 26 ng/L — ABNORMAL HIGH (ref <18)
Troponin I (High Sensitivity): 75 ng/L — ABNORMAL HIGH (ref <18)
Troponin I (High Sensitivity): 96 ng/L — ABNORMAL HIGH (ref <18)
Troponin I (High Sensitivity): 72 ng/L — ABNORMAL HIGH (ref <18)

## 2021-08-16 LAB — LIPASE, BLOOD: Lipase: 23 U/L (ref 11–51)

## 2021-08-16 MED ORDER — ONDANSETRON HCL 4 MG/2ML IJ SOLN
4.0000 mg | Freq: Once | INTRAMUSCULAR | Status: AC | PRN
  Administered 2021-08-16: 11:00:00 4 mg via INTRAVENOUS
  Filled 2021-08-16: qty 2

## 2021-08-16 MED ORDER — LACTATED RINGERS IV BOLUS (SEPSIS)
250.0000 mL | Freq: Once | INTRAVENOUS | Status: AC
  Administered 2021-08-16: 16:00:00 250 mL via INTRAVENOUS

## 2021-08-16 MED ORDER — DIPHENHYDRAMINE HCL 50 MG/ML IJ SOLN
25.0000 mg | Freq: Four times a day (QID) | INTRAMUSCULAR | Status: DC | PRN
Start: 2021-08-16 — End: 2021-08-18
  Administered 2021-08-16 – 2021-08-17 (×2): 25 mg via INTRAVENOUS
  Filled 2021-08-16: qty 1

## 2021-08-16 MED ORDER — VANCOMYCIN HCL IN DEXTROSE 1-5 GM/200ML-% IV SOLN
1000.0000 mg | Freq: Once | INTRAVENOUS | Status: DC
  Filled 2021-08-16: qty 200

## 2021-08-16 MED ORDER — INSULIN ASPART 100 UNIT/ML IJ SOLN: 0.0000 [IU] | Freq: Every day | INTRAMUSCULAR | Status: DC

## 2021-08-16 MED ORDER — IOHEXOL 350 MG/ML SOLN
100.0000 mL | Freq: Once | INTRAVENOUS | Status: AC | PRN
  Administered 2021-08-16: 12:00:00 100 mL via INTRAVENOUS

## 2021-08-16 MED ORDER — INSULIN ASPART 100 UNIT/ML IJ SOLN
0.0000 [IU] | Freq: Three times a day (TID) | INTRAMUSCULAR | Status: DC
  Administered 2021-08-16 – 2021-08-17 (×2): 1 [IU] via SUBCUTANEOUS
  Filled 2021-08-16 (×2): qty 1

## 2021-08-16 MED ORDER — NICOTINE 21 MG/24HR TD PT24
21.0000 mg | MEDICATED_PATCH | Freq: Every day | TRANSDERMAL | Status: DC
  Filled 2021-08-16: qty 1

## 2021-08-16 MED ORDER — LACTATED RINGERS IV BOLUS (SEPSIS)
1000.0000 mL | Freq: Once | INTRAVENOUS | Status: AC
  Administered 2021-08-16: 12:00:00 1000 mL via INTRAVENOUS

## 2021-08-16 MED ORDER — FAMOTIDINE IN NACL 20-0.9 MG/50ML-% IV SOLN
20.0000 mg | Freq: Two times a day (BID) | INTRAVENOUS | Status: DC
  Administered 2021-08-17 – 2021-08-18 (×4): 20 mg via INTRAVENOUS
  Filled 2021-08-16 (×4): qty 50

## 2021-08-16 MED ORDER — ALBUTEROL SULFATE HFA 108 (90 BASE) MCG/ACT IN AERS: 2.0000 | INHALATION_SPRAY | RESPIRATORY_TRACT | Status: DC | PRN

## 2021-08-16 MED ORDER — MORPHINE SULFATE (PF) 2 MG/ML IV SOLN
2.0000 mg | INTRAVENOUS | Status: DC | PRN
  Administered 2021-08-17 (×2): 2 mg via INTRAVENOUS
  Filled 2021-08-16 (×2): qty 1

## 2021-08-16 MED ORDER — OSELTAMIVIR PHOSPHATE 75 MG PO CAPS
75.0000 mg | ORAL_CAPSULE | Freq: Two times a day (BID) | ORAL | Status: DC
  Administered 2021-08-16 – 2021-08-18 (×5): 75 mg via ORAL
  Filled 2021-08-16 (×6): qty 1

## 2021-08-16 MED ORDER — METRONIDAZOLE 500 MG/100ML IV SOLN
500.0000 mg | Freq: Once | INTRAVENOUS | Status: DC
  Filled 2021-08-16: qty 100

## 2021-08-16 MED ORDER — LACTATED RINGERS IV SOLN: INTRAVENOUS | Status: DC

## 2021-08-16 MED ORDER — LACTATED RINGERS IV BOLUS (SEPSIS)
1000.0000 mL | Freq: Once | INTRAVENOUS | Status: AC
  Administered 2021-08-16: 15:00:00 1000 mL via INTRAVENOUS

## 2021-08-16 MED ORDER — SODIUM CHLORIDE 0.9 % IV SOLN
2.0000 g | Freq: Once | INTRAVENOUS | Status: DC
  Administered 2021-08-16: 12:00:00 2 g via INTRAVENOUS
  Filled 2021-08-16: qty 2

## 2021-08-16 MED ORDER — DM-GUAIFENESIN ER 30-600 MG PO TB12
1.0000 | ORAL_TABLET | Freq: Two times a day (BID) | ORAL | Status: DC | PRN
  Administered 2021-08-17: 1 via ORAL
  Filled 2021-08-16: qty 1

## 2021-08-16 MED ORDER — LACTATED RINGERS IV BOLUS: 1000.0000 mL | Freq: Once | INTRAVENOUS | Status: DC

## 2021-08-16 MED ORDER — FAMOTIDINE IN NACL 20-0.9 MG/50ML-% IV SOLN
20.0000 mg | Freq: Once | INTRAVENOUS | Status: AC
  Administered 2021-08-16: 12:00:00 20 mg via INTRAVENOUS
  Filled 2021-08-16: qty 50

## 2021-08-16 MED ORDER — NITROGLYCERIN 0.4 MG SL SUBL: 0.4000 mg | SUBLINGUAL_TABLET | SUBLINGUAL | Status: DC | PRN

## 2021-08-16 MED ORDER — HYDRALAZINE HCL 20 MG/ML IJ SOLN: 5.0000 mg | INTRAMUSCULAR | Status: DC | PRN

## 2021-08-16 MED ORDER — ALUM & MAG HYDROXIDE-SIMETH 200-200-20 MG/5ML PO SUSP
30.0000 mL | Freq: Four times a day (QID) | ORAL | Status: DC | PRN
  Administered 2021-08-16: 21:00:00 30 mL via ORAL
  Filled 2021-08-16: qty 30

## 2021-08-16 MED ORDER — DIPHENHYDRAMINE HCL 50 MG/ML IJ SOLN
INTRAMUSCULAR | Status: AC
  Filled 2021-08-16: qty 1

## 2021-08-16 MED ORDER — ALBUTEROL SULFATE (2.5 MG/3ML) 0.083% IN NEBU: 2.5000 mg | INHALATION_SOLUTION | RESPIRATORY_TRACT | Status: DC | PRN

## 2021-08-16 MED ORDER — LORAZEPAM 2 MG/ML IJ SOLN
0.5000 mg | Freq: Four times a day (QID) | INTRAMUSCULAR | Status: DC | PRN
  Administered 2021-08-16 – 2021-08-17 (×3): 0.5 mg via INTRAVENOUS
  Filled 2021-08-16 (×3): qty 1

## 2021-08-16 MED ORDER — DIPHENHYDRAMINE HCL 25 MG PO CAPS: 25.0000 mg | ORAL_CAPSULE | Freq: Four times a day (QID) | ORAL | Status: DC | PRN

## 2021-08-16 MED ORDER — INSULIN ASPART 100 UNIT/ML IJ SOLN: 0.0000 [IU] | INTRAMUSCULAR | Status: DC

## 2021-08-16 MED ORDER — ACETAMINOPHEN 325 MG PO TABS: 650.0000 mg | ORAL_TABLET | Freq: Four times a day (QID) | ORAL | Status: DC | PRN

## 2021-08-16 NOTE — Progress Notes (Signed)
PHARMACY -  BRIEF ANTIBIOTIC NOTE   Pharmacy has received consult(s) for Vancomycin and Cefepime from an ED provider.  The patient's profile has been reviewed for ht/wt/allergies/indication/available labs.    One time order(s) placed by MD for Cefepime and Vancomycin  Further antibiotics/pharmacy consults should be ordered by admitting physician if indicated.                       Thank you, Chesnie Capell A 08/16/2021  11:47 AM

## 2021-08-16 NOTE — ED Triage Notes (Signed)
Patient to ER for c/o shortness of breath x3 days. Patient reports N/V, HA's, diarrhea. Patient able to speak in complete sentences, but appears short of breath.

## 2021-08-16 NOTE — ED Notes (Signed)
Attempted 20g at R ac for blood cultures.

## 2021-08-16 NOTE — ED Notes (Signed)
EDP ZTamala Julian at bedside.

## 2021-08-16 NOTE — ED Notes (Signed)
Pt away at imaging.

## 2021-08-16 NOTE — ED Provider Notes (Signed)
[facility] Emergency Department Provider Note  ____________________________________________   Event Date/Time   First MD Initiated Contact with Patient 08/16/21 1102     (approximate)  I have reviewed the triage vital signs and the nursing notes.   HISTORY  Chief Complaint Shortness of Breath   HPI [name] is a 55 y.o. female with below noted past medical history who presents for assessment of several complaints including chest pain, shortness of breath, cough, headache, congestion, chills, weakness, abdominal pain vomiting diarrhea.  It seems the symptoms started about 3 days ago.  Patient states she had no melanotic or bloody stools until today when she has some bright red blood in her poop.  She has not had any urinary symptoms.  Denies any falls or injuries.  Does endorse tobacco abuse.  She also states that she took several doses of Goody powders prior to onset of symptoms but has never had any GI bleeding or been on blood thinners otherwise.  No other acute concerns at this time.         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   Chronic headaches    Depression    Hernia    Hypertension    Pancreatitis, acute    Sinusitis    Wrist fracture     Patient Active Problem List   Diagnosis Date Noted   Major depressive disorder, recurrent, severe with psychotic features ([place])    Acute gastritis 05/16/2015   Hypertensive urgency 05/13/2015   Facial droop 05/13/2015   Tobacco use disorder 05/10/2015   Cannabis use disorder, moderate, dependence ([place]) 05/10/2015   Major depressive disorder, recurrent severe without psychotic features ([place]) 26/37/8588   Complicated grief 50/27/7412    Past Surgical History:  Procedure Laterality Date   ABDOMINAL HYSTERECTOMY     CATARACT EXTRACTION W/PHACO Right 10/27/2018   Procedure: CATARACT EXTRACTION PHACO AND INTRAOCULAR LENS  PLACEMENT (IOC)-RIGHT 1ST CASE PER POSTING SHEET;  Surgeon: Leandrew Koyanagi, MD;  Location: [facility] ORS;  Service: Ophthalmology;  Laterality: Right;  [place]  01:43 CDE 16.55 AP% 12.2 Fluid pack lot # [id number]   CATARACT EXTRACTION W/PHACO Left 04/30/2021   Procedure: CATARACT EXTRACTION PHACO AND INTRAOCULAR LENS PLACEMENT ([place]) LEFT VISION BLUE [place];  Surgeon: Birder Robson, MD;  Location: [place];  Service: Ophthalmology;  Laterality: Left;  17.19 01:14.6   CESAREAN SECTION      Prior to Admission medications   Medication Sig Start Date End Date Taking? Authorizing Provider  albuterol (PROVENTIL HFA;VENTOLIN HFA) 108 (90 Base) MCG/ACT inhaler Inhale 1-2 puffs into the lungs every 6 (six) hours as needed for wheezing or shortness of breath.    [provider]  diphenhydrAMINE (BENADRYL) 25 MG tablet Take 50 mg by mouth 2 (two) times daily.    [provider]  GOODYS EXTRA STRENGTH [phone] MG PACK Take 1 packet by mouth 3 (three) times daily as needed (pain.).    [provider]  ondansetron (ZOFRAN ODT) 4 MG disintegrating tablet Take 1 tablet (4 mg total) by mouth every 8 (eight) hours as needed for nausea or vomiting. Patient not taking: Reported on 04/19/2021 02/11/21   Naaman Plummer, MD    Allergies Pantoprazole sodium  Family History  Problem Relation Age of Onset   Cancer Father    Diabetes Other     Social History Social History  Tobacco Use   Smoking status: Every Day    Packs/day: 1.00    Years: 20.00    Pack years: 20.00    Types: Cigarettes   Smokeless tobacco: Never  Substance Use Topics   Alcohol use: No   Drug use: Yes    Types: Marijuana    Review of Systems  Review of Systems  Constitutional:  Positive for chills and malaise/fatigue. Negative for fever.  HENT:  Positive for congestion. Negative for sore throat.   Eyes:  Negative for pain.  Respiratory:  Positive for cough and shortness of breath. Negative  for stridor.   Cardiovascular:  Positive for chest pain.  Gastrointestinal:  Positive for abdominal pain, blood in stool, diarrhea, heartburn, nausea and vomiting.  Genitourinary:  Negative for dysuria.  Musculoskeletal:  Positive for myalgias.  Skin:  Negative for rash.  Neurological:  Positive for weakness and headaches. Negative for seizures and loss of consciousness.  Psychiatric/Behavioral:  Negative for suicidal ideas.   All other systems reviewed and are negative.    ____________________________________________   PHYSICAL EXAM:  VITAL SIGNS: ED Triage Vitals  Enc Vitals Group     BP 08/16/21 1041 127/89     Pulse Rate 08/16/21 1041 (!) 164     Resp 08/16/21 1041 (!) 22     Temp 08/16/21 1041 97.8 F (36.6 C)     Temp Source 08/16/21 1041 Oral     SpO2 08/16/21 1041 98 %     Weight 08/16/21 1043 165 lb (74.8 kg)     Height 08/16/21 1043 '5\' 6"'  (1.676 m)     Head Circumference --      Peak Flow --      Pain Score 08/16/21 1042 5     Pain Loc --      Pain Edu? --      Excl. in [place]? --    Vitals:   08/16/21 1041 08/16/21 1207  BP: 127/89 (!) 145/115  Pulse: (!) 164 (!) 145  Resp: (!) 22 (!) 24  Temp: 97.8 F (36.6 C)   SpO2: 98% 99%   Physical Exam Vitals and nursing note reviewed.  Constitutional:      General: She is in acute distress.     Appearance: She is well-developed. She is ill-appearing.  HENT:     Head: Normocephalic and atraumatic.     Right Ear: External ear normal.     Left Ear: External ear normal.     Nose: Nose normal.     Mouth/Throat:     Mouth: Mucous membranes are dry.  Eyes:     Conjunctiva/sclera: Conjunctivae normal.  Cardiovascular:     Rate and Rhythm: Regular rhythm. Tachycardia present.     Heart sounds: No murmur heard. Pulmonary:     Effort: Pulmonary effort is normal. Tachypnea present. No respiratory distress.     Breath sounds: Normal breath sounds.  Abdominal:     Palpations: Abdomen is soft.     Tenderness: There is  no abdominal tenderness.  Musculoskeletal:        General: No swelling.     Cervical back: Neck supple.     Right lower leg: No edema.     Left lower leg: No edema.  Skin:    General: Skin is warm and dry.     Capillary Refill: Capillary refill takes more than 3 seconds.  Neurological:     Mental Status: She is alert and oriented to person, place, and time.  Psychiatric:  Mood and Affect: Mood normal.    Rectal exam there is hemorrhoid that is not actively bleeding and no actively bleeding fissures.  Some gross blood is dried around the rectum. ____________________________________________   LABS (all labs ordered are listed, but only abnormal results are displayed)  Labs Reviewed  RESP PANEL BY RT-PCR (FLU A&B, COVID) ARPGX2 - Abnormal; Notable for the following components:      Result Value   Influenza A by PCR POSITIVE (*)    All other components within normal limits  BASIC METABOLIC PANEL - Abnormal; Notable for the following components:   Sodium 132 (*)    Chloride 91 (*)    Glucose, Bld 282 (*)    BUN 36 (*)    Creatinine, Ser 1.12 (*)    GFR, Estimated 58 (*)    All other components within normal limits  CBC - Abnormal; Notable for the following components:   WBC 12.2 (*)    Hemoglobin 15.4 (*)    All other components within normal limits  LACTIC ACID, PLASMA - Abnormal; Notable for the following components:   Lactic Acid, Venous 3.9 (*)    All other components within normal limits  HEPATIC FUNCTION PANEL - Abnormal; Notable for the following components:   Total Bilirubin 1.4 (*)    Indirect Bilirubin 1.2 (*)    All other components within normal limits  TROPONIN I (HIGH SENSITIVITY) - Abnormal; Notable for the following components:   Troponin I (High Sensitivity) 26 (*)    All other components within normal limits  CULTURE, BLOOD (ROUTINE X 2)  CULTURE, BLOOD (ROUTINE X 2)  GASTROINTESTINAL PANEL BY PCR, STOOL (REPLACES STOOL CULTURE)  PROTIME-INR  APTT   LIPASE, BLOOD  PROCALCITONIN  MAGNESIUM  LACTIC ACID, PLASMA  URINALYSIS, ROUTINE W REFLEX MICROSCOPIC  URINALYSIS, COMPLETE (UACMP) WITH MICROSCOPIC  HEMOGLOBIN A1C  TYPE AND SCREEN  TROPONIN I (HIGH SENSITIVITY)   ____________________________________________  EKG  Sinus tachycardia with a ventricular rate of 150, normal axis, unremarkable intervals with diffuse nonspecific ST changes possibly rate related without other clearance of acute ischemia or significant arrhythmia. ____________________________________________  RADIOLOGY  ED MD interpretation: Chest x-ray shows no focal consolidation, effusion, edema, pneumothorax or other clear acute thoracic process.  CTA chest shows no evidence of PE.  There is no evidence of pneumonia or large effusion but there is evidence of emphysema and some bronchial wall thickening as well as aortic atherosclerosis.  No other acute thoracic process noted.  CT abdomen pelvis remarkable for fluid-filled colon with air-fluid levels consistent with enteritis.  There are some slight increase in size of known right adrenal lesion.  No evidence of diverticulitis, appendicitis, SBO, perinephric stranding, kidney stone, pancreatitis or other clear acute abdominal or pelvic process.  Official radiology report(s): DG Chest 2 View  Result Date: 08/16/2021 CLINICAL DATA:  Chest pain.  Shortness of breath. EXAM: CHEST - 2 VIEW COMPARISON:  February 11, 2021. FINDINGS: The heart size and mediastinal contours are within normal limits. Both lungs are clear. The visualized skeletal structures are unremarkable. IMPRESSION: No active cardiopulmonary disease. Electronically Signed   By: Marijo Conception M.D.   On: 08/16/2021 11:10   CT Angio Chest PE W and/or Wo Contrast  Result Date: 08/16/2021 CLINICAL DATA:  55 year old female with history of shortness of breath. EXAM: CT ANGIOGRAPHY CHEST WITH CONTRAST TECHNIQUE: Multidetector CT imaging of the chest was performed  using the standard protocol during bolus administration of intravenous contrast. Multiplanar CT image reconstructions and  MIPs were obtained to evaluate the vascular anatomy. CONTRAST:  100 mL Omnipaque 350, intravenous COMPARISON:  05/09/2010 FINDINGS: Cardiovascular: Satisfactory opacification of the pulmonary arteries to the segmental level. No evidence of pulmonary embolism. Normal heart size. No pericardial effusion. Mild scattered atherosclerotic calcification of the thoracic aorta. Mediastinum/Nodes: No enlarged mediastinal, hilar, or axillary lymph nodes. Thyroid gland, trachea, and esophagus demonstrate no significant findings. Lungs/Pleura: Moderate upper lobe predominant centrilobular emphysema. Mild diffuse bronchial wall thickening. No suspicious pulmonary nodules. No focal consolidations, pleural effusion, or pneumothorax. Upper Abdomen: Please refer to the dedicated CT abdomen pelvis from the same day for intra abdominal findings. Musculoskeletal: No chest wall abnormality. No acute or significant osseous findings. Review of the MIP images confirms the above findings. IMPRESSION: Vascular: 1. No evidence of pulmonary embolism. 2.  Aortic Atherosclerosis (ICD10-I70.0). Non-Vascular: 1. No acute intrathoracic abnormality. 2. Chronic smoking related changes including emphysema (ICD10-J43.9) and bronchial wall thickening. Ruthann Cancer, MD [facility] Radiology Electronically Signed   By: Ruthann Cancer M.D.   On: 08/16/2021 12:41   CT ABDOMEN PELVIS W CONTRAST  Result Date: 08/16/2021 CLINICAL DATA:  Abdominal pain, acute, nonlocalized EXAM: CT ABDOMEN AND PELVIS WITH CONTRAST TECHNIQUE: Multidetector CT imaging of the abdomen and pelvis was performed using the standard protocol following bolus administration of intravenous contrast. CONTRAST:  165m OMNIPAQUE IOHEXOL 350 MG/ML SOLN COMPARISON:  2014 FINDINGS: Lower chest: Dictated separately.  Hepatobiliary: Perifissural focal fat. Liver is otherwise unremarkable. Gallbladder is unremarkable. No biliary dilatation. Pancreas: Unremarkable. Spleen: Unremarkable. Adrenals/Urinary Tract: Heterogeneous right adrenal lesion has been present on multiple remote studies but has slightly increased in size. Left adrenal is unremarkable. Kidneys are unremarkable. The bladder is poorly distended. Stomach/Bowel: Stomach is within normal limits. Bowel is normal in caliber. Normal appendix. Fluid-filled colon with air-fluid levels. Vascular/Lymphatic: Atherosclerosis.  No enlarged nodes. Reproductive: No pelvic mass. Other: No free fluid.  Abdominal wall is unremarkable. Musculoskeletal: Degenerative disc disease at L5-S1. IMPRESSION: Fluid-filled colon with air-fluid levels consistent with reported history of diarrhea. Slight increase in size of heterogeneous right adrenal lesion. Given presence on multiple remote studies probably represents slow growth of an adenoma. Aortic atherosclerosis. Electronically Signed   By: PMacy MisM.D.   On: 08/16/2021 12:50    ____________________________________________   PROCEDURES  Procedure(s) performed (including Critical Care):  .Critical Care Performed by: SLucrezia Starch MD Authorized by: SLucrezia Starch MD   Critical care provider statement:    Critical care time (minutes):  30   Critical care was necessary to treat or prevent imminent or life-threatening deterioration of the following conditions:  Sepsis   Critical care was time spent personally by me on the following activities:  Development of treatment plan with patient or surrogate, discussions with consultants, evaluation of patient's response to treatment, examination of patient, ordering and review of laboratory studies, ordering and review of radiographic studies, ordering and performing treatments and interventions, pulse oximetry, re-evaluation of patient's condition and review of old charts    Care discussed with: admitting provider     ____________________________________________   INITIAL IMPRESSION / [place]/ ED COURSE      Patient presents with above-stated history exam for assessment of consolation of symptoms described above.  On arrival patient is tachycardic at 164, tachypneic at 22 with otherwise stable vitals.  Her abdomen is mildly tender throughout and her lungs are relatively clear bilaterally.  Differential includes ACS, arrhythmia, anemia, embolic derangements, sepsis and endocrine derangements.  In addition given concern for some bloody stools and dried blood seen on rectal exam concerning for possible ischemic colitis given patient appears severely dehydrated reports some GI losses.  EKG remarkable for sinus tachycardia with a ventricular rate of 150, normal axis, unremarkable intervals with diffuse nonspecific ST changes possibly rate related without other clearance of acute ischemia or significant arrhythmia.  Troponin slightly elevated at 26 which I suspect represents demand ischemia in the setting of severe dehydration and acute infectious process.  We will plan to trend this and defer ASA or anticoagulation at this time especially given concern for possible GI bleed.  Chest x-ray shows no focal consolidation, effusion, edema, pneumothorax or other clear acute thoracic process.  CTA chest shows no evidence of PE.  There is no evidence of pneumonia or large effusion but there is evidence of emphysema and some bronchial wall thickening as well as aortic atherosclerosis.  No other acute thoracic process noted.   CT abdomen pelvis remarkable for fluid-filled colon with air-fluid levels consistent with enteritis.  There are some slight increase in size of known right adrenal lesion.  No evidence of diverticulitis, appendicitis, SBO, perinephric stranding, kidney stone, pancreatitis or other clear acute abdominal or pelvic process.  Lactic acid 3.9.   COVID PCR is negative.  Influenza is positive.  Lipase is within normal limits and not consistent with acute pancreatitis.  Procalcitonin is undetectable.  Magnesium is within normal limits.  BMP shows a glucose of 282 without other significant electrolyte or metabolic derangements.  CBC shows WBC count of 12.2 and hemoglobin of 15.4 with normal platelets.  INR is unremarkable.  Hepatic function panel unremarkable.  Given concern for GI bleed I discussed with on-call gastroenterologist Dr. Alice Reichert who agreed with suspicion for possible ischemic colitis given dehydration from acute influenza infection.  Agreed with supportive care at this time.  Given multiple SIRS criteria met somewhat difficult to exclude sepsis patient was given empiric dose of cefepime.  I will admit to medicine service for further evaluation and management.      ____________________________________________   FINAL CLINICAL IMPRESSION(S) / ED DIAGNOSES  Final diagnoses:  Influenza A  Dehydration  Gastrointestinal hemorrhage, unspecified gastrointestinal hemorrhage type  QT prolongation  Troponin I above reference range  Lactic acid acidosis  SIRS (systemic inflammatory response syndrome) (HCC)  Sepsis, due to unspecified organism, unspecified whether acute organ dysfunction present (HCC)    Medications  lactated ringers infusion ( Intravenous New Bag/Given 08/16/21 1158)  lactated ringers bolus 1,000 mL (0 mLs Intravenous Stopped 08/16/21 1240)    And  lactated ringers bolus 1,000 mL (has no administration in time range)    And  lactated ringers bolus 250 mL (has no administration in time range)  insulin aspart (novoLOG) injection 0-15 Units (has no administration in time range)  LORazepam (ATIVAN) injection 0.5 mg (has no administration in time range)  ondansetron (ZOFRAN) injection 4 mg (4 mg Intravenous Given 08/16/21 1056)  famotidine (PEPCID) IVPB 20 mg premix (0 mg Intravenous Stopped 08/16/21 1233)   iohexol (OMNIPAQUE) 350 MG/ML injection 100 mL (100 mLs Intravenous Contrast Given 08/16/21 1225)     ED Discharge Orders     None        Note:  This document was prepared using Dragon voice recognition software and may include unintentional dictation errors.    Lucrezia Starch, MD 08/16/21 [phone]

## 2021-08-16 NOTE — ED Notes (Signed)
Pt c/o nausea; attending notified via secure chat since no prn currently for this in chart.

## 2021-08-16 NOTE — ED Notes (Signed)
Pt states now having CP. EDP Z. Hornbrook notified and 2nd RN completing EKG while this RN collects blood work.

## 2021-08-16 NOTE — H&P (Signed)
History and Physical    [name] NWG:[id number] DOB: 1966/03/03 DOA: 08/16/2021  Referring MD/NP/PA:   PCP: Pcp, No   Patient coming from:  The patient is coming from home.  At baseline, pt is independent for most of ADL.        Chief Complaint: Nausea, vomiting, diarrhea, abdominal pain, chest pain,  HPI: [name] is a 55 y.o. female with medical history significant of hypertension, GERD, depression with anxiety, pancreatitis, vulvar cancer, Bell's palsy, hernia, cannabinoids use disorder, tobacco abuse, who presents with nausea, vomiting, diarrhea, abdominal pain, chest pain.  Patient states that she has been sick for more than 3 days.  She has body aches, headache, chills, cough with some brownish sputum production, nasal congestion, generalized weakness.  No fever.   She also has nausea, vomiting, diarrhea and abdominal pain.  She states that she has more than 10 times of nonbilious nonbloody vomiting each day.  She has at least 5 times of watery diarrhea each day. Patient states she had no melanotic or bloody stools until today when she has some bright red blood in her stool. She states that she took several doses of Goody powders. Patient states that she does not have abdominal pain at resting, but vomiting and coughing induce some left middle quadrant abdominal pain.  Patient states that she had mild dull substernal chest pain earlier, which has resolved.  Currently no active chest pain.  Patient has mild shortness of breath.  No tenderness in calf areas.  She states that she feels dizzy, no unilateral numbness or tinglings extremities.  No facial droop or slurred speech.  No symptoms of UTI.  He is not taking blood thinners.  No history of GI bleeding.   ED Course: pt was found to have WBC 12.2, lactic acid 3.9, INR 1.0, PTT 24, troponin level 26, lipase 23, negative COVID PCR, positive influenza A, mild AKI with creatinine 1.12, BUN 36, GFR 58, temperature normal, blood  pressure 145/115, tachycardia with heart rate up to 164, RR 24, oxygen saturation 98% on room air.  Chest x-ray negative.  CT angiogram is negative for PE.  Patient is admitted to progressive bed as inpatient.  Dr. Alice Reichert of GI is consulted.   CT of abdomen/pelvis: Fluid-filled colon with air-fluid levels consistent with reported history of diarrhea.   Slight increase in size of heterogeneous right adrenal lesion. Given presence on multiple remote studies probably represents slow growth of an adenoma.   Aortic atherosclerosis.  Review of Systems:   General: no fevers, has chills, no body weight gain, has poor appetite, has fatigue HEENT: no blurry vision, hearing changes or sore throat Respiratory: has dyspnea, coughing, no wheezing CV: had chest pain, no palpitations GI: has nausea, vomiting, abdominal pain, diarrhea, no constipation GU: no dysuria, burning on urination, increased urinary frequency, hematuria  Ext: no leg edema Neuro: no unilateral weakness, numbness, or tingling, no vision change or hearing loss Skin: no rash, no skin tear. MSK: No muscle spasm, no deformity, no limitation of range of movement in spin Heme: No easy bruising.  Travel history: No recent long distant travel.  Allergy:  Allergies  Allergen Reactions   Pantoprazole Sodium Other (See Comments)    Makes patient feel sick all over (per patient who spoke with MD)    Past Medical History:  Diagnosis Date   Anxiety    PANIC ATTACKS   Bell palsy    Cancer ([place])    VULVAR  Chronic headaches    Depression    Hernia    Hypertension    Pancreatitis, acute    Sinusitis    Wrist fracture     Past Surgical History:  Procedure Laterality Date   ABDOMINAL HYSTERECTOMY     CATARACT EXTRACTION W/PHACO Right 10/27/2018   Procedure: CATARACT EXTRACTION PHACO AND INTRAOCULAR LENS PLACEMENT (IOC)-RIGHT 1ST CASE PER POSTING SHEET;  Surgeon: Leandrew Koyanagi, MD;  Location: [facility];  Service:  Ophthalmology;  Laterality: Right;  [place]  01:43 CDE 16.55 AP% 12.2 Fluid pack lot # [id number]   CATARACT EXTRACTION W/PHACO Left 04/30/2021   Procedure: CATARACT EXTRACTION PHACO AND INTRAOCULAR LENS PLACEMENT ([place]) LEFT VISION BLUE [facility];  Surgeon: Birder Robson, MD;  Location: [place];  Service: Ophthalmology;  Laterality: Left;  17.19 01:14.6   CESAREAN SECTION      Social History:  reports that she has been smoking cigarettes. She has a 20.00 pack-year smoking history. She has never used smokeless tobacco. She reports current drug use. Drug: Marijuana. She reports that she does not drink alcohol.  Family History:  Family History  Problem Relation Age of Onset   Cancer Father    Diabetes Other      Prior to Admission medications   Medication Sig Start Date End Date Taking? Authorizing Provider  albuterol (PROVENTIL HFA;VENTOLIN HFA) 108 (90 Base) MCG/ACT inhaler Inhale 1-2 puffs into the lungs every 6 (six) hours as needed for wheezing or shortness of breath.    [provider]  diphenhydrAMINE (BENADRYL) 25 MG tablet Take 50 mg by mouth 2 (two) times daily.    [provider]  GOODYS EXTRA STRENGTH [phone] MG PACK Take 1 packet by mouth 3 (three) times daily as needed (pain.).    [provider]  ondansetron (ZOFRAN ODT) 4 MG disintegrating tablet Take 1 tablet (4 mg total) by mouth every 8 (eight) hours as needed for nausea or vomiting. Patient not taking: Reported on 04/19/2021 02/11/21   Naaman Plummer, MD    Physical Exam: Vitals:   08/16/21 1207 08/16/21 1431 08/16/21 1602 08/16/21 1700  BP: (!) 145/115 (!) 151/98 (!) 153/82 (!) 139/107  Pulse: (!) 145 (!) 134 92 (!) 112  Resp: (!) 24 (!) 24 (!) 21 (!) 21  Temp:      TempSrc:      SpO2: 99% 97% 99% 99%  Weight:      Height:       General: Not in acute distress HEENT:       Eyes: PERRL, EOMI, no scleral icterus.       ENT: No discharge from the ears and nose       Neck: No  JVD, no bruit, no mass felt. Heme: No neck lymph node enlargement. Cardiac: S1/S2, RRR, No murmurs, No gallops or rubs. Respiratory: No rales, wheezing, rhonchi or rubs. GI: Soft, nondistended, patient seems to have some abdominal wall tenderness in the left middle quadrant, no rebound pain, no organomegaly, BS present. GU: No hematuria Ext: No pitting leg edema bilaterally. 1+DP/PT pulse bilaterally. Musculoskeletal: No joint deformities, No joint redness or warmth, no limitation of ROM in spin. Skin: No rashes.  Neuro: Alert, oriented X3, cranial nerves II-XII grossly intact, moves all extremities normally. Psych: Patient is not psychotic, no suicidal or hemocidal ideation.  Labs on Admission: I have personally reviewed following labs and imaging studies  CBC: Recent Labs  Lab 08/16/21 1046 08/16/21 1435  WBC 12.2* 11.4*  HGB 15.4* 13.3  HCT 44.5 39.0  MCV 91.8 93.8  PLT 181 937*   Basic Metabolic Panel: Recent Labs  Lab 08/16/21 1046 08/16/21 1104  NA 132*  --   K 3.5  --   CL 91*  --   CO2 27  --   GLUCOSE 282*  --   BUN 36*  --   CREATININE 1.12*  --   CALCIUM 9.0  --   MG  --  1.8   GFR: Estimated Creatinine Clearance: 58.7 mL/min (A) (by C-G formula based on SCr of 1.12 mg/dL (H)). Liver Function Tests: Recent Labs  Lab 08/16/21 1150  AST 33  ALT 32  ALKPHOS 45  BILITOT 1.4*  PROT 6.6  ALBUMIN 4.0   Recent Labs  Lab 08/16/21 1104  LIPASE 23   No results for input(s): AMMONIA in the last 168 hours. Coagulation Profile: Recent Labs  Lab 08/16/21 1150  INR 1.0   Cardiac Enzymes: No results for input(s): CKTOTAL, CKMB, CKMBINDEX, TROPONINI in the last 168 hours. BNP (last 3 results) No results for input(s): PROBNP in the last 8760 hours. HbA1C: No results for input(s): HGBA1C in the last 72 hours. CBG: Recent Labs  Lab 08/16/21 1608  GLUCAP 130*   Lipid Profile: No results for input(s): CHOL, HDL, LDLCALC, TRIG, CHOLHDL, LDLDIRECT in  the last 72 hours. Thyroid Function Tests: No results for input(s): TSH, T4TOTAL, FREET4, T3FREE, THYROIDAB in the last 72 hours. Anemia Panel: No results for input(s): VITAMINB12, FOLATE, FERRITIN, TIBC, IRON, RETICCTPCT in the last 72 hours. Urine analysis:    Component Value Date/Time   COLORURINE YELLOW (A) 05/14/2015 1840   APPEARANCEUR CLEAR (A) 05/14/2015 1840   APPEARANCEUR Clear 09/18/2014 1019   LABSPEC 1.014 05/14/2015 1840   LABSPEC 1.026 09/18/2014 1019   PHURINE 7.0 05/14/2015 1840   GLUCOSEU 50 (A) 05/14/2015 1840   GLUCOSEU 50 mg/dL 09/18/2014 1019   HGBUR NEGATIVE 05/14/2015 1840   BILIRUBINUR NEGATIVE 05/14/2015 1840   BILIRUBINUR Negative 09/18/2014 1019   KETONESUR TRACE (A) 05/14/2015 1840   PROTEINUR NEGATIVE 05/14/2015 1840   UROBILINOGEN 1.0 08/27/2013 1048   NITRITE NEGATIVE 05/14/2015 1840   LEUKOCYTESUR NEGATIVE 05/14/2015 1840   LEUKOCYTESUR Negative 09/18/2014 1019   Sepsis Labs: @LABRCNTIP (procalcitonin:4,lacticidven:4) ) Recent Results (from the past 240 hour(s))  Resp Panel by RT-PCR (Flu A&B, Covid) Nasopharyngeal Swab     Status: Abnormal   Collection Time: 08/16/21 10:46 AM   Specimen: Nasopharyngeal Swab; Nasopharyngeal(NP) swabs in vial transport medium  Result Value Ref Range Status   SARS Coronavirus 2 by RT PCR NEGATIVE NEGATIVE Final    Comment: (NOTE) SARS-CoV-2 target nucleic acids are NOT DETECTED.  The SARS-CoV-2 RNA is generally detectable in upper respiratory specimens during the acute phase of infection. The lowest concentration of SARS-CoV-2 viral copies this assay can detect is 138 copies/mL. A negative result does not preclude SARS-Cov-2 infection and should not be used as the sole basis for treatment or other patient management decisions. A negative result may occur with  improper specimen collection/handling, submission of specimen other than nasopharyngeal swab, presence of viral mutation(s) within the areas targeted  by this assay, and inadequate number of viral copies(<138 copies/mL). A negative result must be combined with clinical observations, patient history, and epidemiological information. The expected result is Negative.  Fact Sheet for Patients:  EntrepreneurPulse.com.au  Fact Sheet for Healthcare Providers:  IncredibleEmployment.be  This test is no t yet approved or cleared by the [place] and  has been authorized  for detection and/or diagnosis of SARS-CoV-2 by FDA under an Emergency Use Authorization (EUA). This EUA will remain  in effect (meaning this test can be used) for the duration of the COVID-19 declaration under Section 564(b)(1) of the Act, 21 U.S.C.section 360bbb-3(b)(1), unless the authorization is terminated  or revoked sooner.       Influenza A by PCR POSITIVE (A) NEGATIVE Final   Influenza B by PCR NEGATIVE NEGATIVE Final    Comment: (NOTE) The Xpert Xpress SARS-CoV-2/FLU/RSV plus assay is intended as an aid in the diagnosis of influenza from Nasopharyngeal swab specimens and should not be used as a sole basis for treatment. Nasal washings and aspirates are unacceptable for Xpert Xpress SARS-CoV-2/FLU/RSV testing.  Fact Sheet for Patients: EntrepreneurPulse.com.au  Fact Sheet for Healthcare Providers: IncredibleEmployment.be  This test is not yet approved or cleared by the [place] FDA and has been authorized for detection and/or diagnosis of SARS-CoV-2 by FDA under an Emergency Use Authorization (EUA). This EUA will remain in effect (meaning this test can be used) for the duration of the COVID-19 declaration under Section 564(b)(1) of the Act, 21 U.S.C. section 360bbb-3(b)(1), unless the authorization is terminated or revoked.  Performed at [facility], [address] [place], [place] [address]      Radiological Exams on Admission: DG Chest 2 View  Result  Date: 08/16/2021 CLINICAL DATA:  Chest pain.  Shortness of breath. EXAM: CHEST - 2 VIEW COMPARISON:  February 11, 2021. FINDINGS: The heart size and mediastinal contours are within normal limits. Both lungs are clear. The visualized skeletal structures are unremarkable. IMPRESSION: No active cardiopulmonary disease. Electronically Signed   By: Marijo Conception M.D.   On: 08/16/2021 11:10   CT Angio Chest PE W and/or Wo Contrast  Result Date: 08/16/2021 CLINICAL DATA:  55 year old female with history of shortness of breath. EXAM: CT ANGIOGRAPHY CHEST WITH CONTRAST TECHNIQUE: Multidetector CT imaging of the chest was performed using the standard protocol during bolus administration of intravenous contrast. Multiplanar CT image reconstructions and MIPs were obtained to evaluate the vascular anatomy. CONTRAST:  100 mL Omnipaque 350, intravenous COMPARISON:  05/09/2010 FINDINGS: Cardiovascular: Satisfactory opacification of the pulmonary arteries to the segmental level. No evidence of pulmonary embolism. Normal heart size. No pericardial effusion. Mild scattered atherosclerotic calcification of the thoracic aorta. Mediastinum/Nodes: No enlarged mediastinal, hilar, or axillary lymph nodes. Thyroid gland, trachea, and esophagus demonstrate no significant findings. Lungs/Pleura: Moderate upper lobe predominant centrilobular emphysema. Mild diffuse bronchial wall thickening. No suspicious pulmonary nodules. No focal consolidations, pleural effusion, or pneumothorax. Upper Abdomen: Please refer to the dedicated CT abdomen pelvis from the same day for intra abdominal findings. Musculoskeletal: No chest wall abnormality. No acute or significant osseous findings. Review of the MIP images confirms the above findings. IMPRESSION: Vascular: 1. No evidence of pulmonary embolism. 2.  Aortic Atherosclerosis (ICD10-I70.0). Non-Vascular: 1. No acute intrathoracic abnormality. 2. Chronic smoking related changes including emphysema  (ICD10-J43.9) and bronchial wall thickening. Ruthann Cancer, MD [facility] Electronically Signed   By: Ruthann Cancer M.D.   On: 08/16/2021 12:41   CT ABDOMEN PELVIS W CONTRAST  Result Date: 08/16/2021 CLINICAL DATA:  Abdominal pain, acute, nonlocalized EXAM: CT ABDOMEN AND PELVIS WITH CONTRAST TECHNIQUE: Multidetector CT imaging of the abdomen and pelvis was performed using the standard protocol following bolus administration of intravenous contrast. CONTRAST:  164mL OMNIPAQUE IOHEXOL 350 MG/ML SOLN COMPARISON:  2014 FINDINGS: Lower chest: Dictated separately. Hepatobiliary: Perifissural focal fat. Liver  is otherwise unremarkable. Gallbladder is unremarkable. No biliary dilatation. Pancreas: Unremarkable. Spleen: Unremarkable. Adrenals/Urinary Tract: Heterogeneous right adrenal lesion has been present on multiple remote studies but has slightly increased in size. Left adrenal is unremarkable. Kidneys are unremarkable. The bladder is poorly distended. Stomach/Bowel: Stomach is within normal limits. Bowel is normal in caliber. Normal appendix. Fluid-filled colon with air-fluid levels. Vascular/Lymphatic: Atherosclerosis.  No enlarged nodes. Reproductive: No pelvic mass. Other: No free fluid.  Abdominal wall is unremarkable. Musculoskeletal: Degenerative disc disease at L5-S1. IMPRESSION: Fluid-filled colon with air-fluid levels consistent with reported history of diarrhea. Slight increase in size of heterogeneous right adrenal lesion. Given presence on multiple remote studies probably represents slow growth of an adenoma. Aortic atherosclerosis. Electronically Signed   By: Macy Mis M.D.   On: 08/16/2021 12:50     EKG: I have personally reviewed.  Sinus rhythm, tachycardia, heart rate 139, QTc 559, poor R wave progression.  Assessment/Plan Principal Problem:   Influenza A Active Problems:   Tobacco use disorder   Nausea vomiting and  diarrhea   Depression with anxiety   HTN (hypertension)   GERD (gastroesophageal reflux disease)   Chest pain   Elevated troponin   Sepsis ([place])   AKI (acute kidney injury) ([place])   Hyperglycemia   Severe sepsis due to  influenza A: Patient meets criteria for severe sepsis with WBC 12.2, tachycardia with heart rate up to 164, tachypnea with RR 24.  Lactic acid is elevated 3.9. -Admitted to progressive bed as inpatient -Start Tamiflu -Follow-up of blood culture -will get Procalcitonin and trend lactic acid levels per sepsis protocol. -IVF: 2.25 L of LR of NS bolus in ED, followed by 125 cc/h   Nausea vomiting and diarrhea and bloody stool: Differential diagnosis include viral gastroenteritis, ischemic colitis.  Dr. Alice Reichert of GI is consulted.  Patient received 1 dose of cefepime in ED, will not continue antibiotics now. -Check C. difficile and GI pathogen panel -IV fluid as above -As needed Zofran and morphine -IV Pepcid -q8h CBC  Tobacco use disorder -Nicotine patch  Depression with anxiety: Not taking medications currently -As needed Ativan  HTN (hypertension): Not taking medications. -IV hydralazine as needed  GERD (gastroesophageal reflux disease) -Pepcid 20 mg twice daily  Chest pain and Elevated troponin: Troponin 26, 75, 26.  She had some chest pain earlier, which is resolved.  No active chest pain currently.  Possibly due to demand ischemia -Will not give aspirin due to rectal bleeding -Check A1c, FLP -As needed morphine for chest pain -Trend troponin  AKI (acute kidney injury) ([place]): Mild AKI.  GFR 58.  Creatinine 1.12, BUN 36, likely due to dehydration -IV fluid as above  Hyperglycemia: Blood sugar 282.  No history of diabetes. -Check A1c -Sliding scale insulin     DVT ppx: SCD Code Status: Full code Family Communication:  Yes, patient's  husband at bed side.    Disposition Plan:  Anticipate discharge back to previous environment Consults called: Dr.  Alice Reichert of GI Admission status and Level of care: Progressive:  as inpt     Status is: Inpatient  Remains inpatient appropriate because: Patient has multiple complaints, now presents with influenza A infection, sepsis, nausea vomiting bloody diarrhea, abdominal pain, also has chest pain with elevated troponin, mild AKI.  Her presentation is highly complicated.  Patient is at high risk of deteriorating.  Need to be treated in hospital for at least 2 days         Date of Service 08/16/2021  Ivor Costa [facility]   If 7PM-7AM, please contact night-coverage [email or web address] 08/16/2021, 6:26 PM

## 2021-08-16 NOTE — ED Notes (Signed)
Pt c/o LUQ discomfort; pt states "I think it is related to reflux". Denies CP "except when I cough" per pt. Provider Randol Kern notified via secure chat.

## 2021-08-16 NOTE — ED Notes (Signed)
Urine sample sent to lab. Pt was unable to provide stool sample.

## 2021-08-16 NOTE — Consult Note (Signed)
GI Inpatient Consult Note  Reason for Consult: Diarrhea, rectal bleeding    Attending Requesting Consult: Dr. Ivor Costa, MD  History of Present Illness: [name] is a 55 y.o. female seen for evaluation of diarrhea and rectal bleeding at the request of admitting hospitalist - Dr. Ivor Costa. Patient has a PMH of HTN, depression with anxiety, GERD, hx of pancreatitis, hx of vulvar cancer, Bell's palsy, cannabinoid use disorder, tobacco abuse, and chronic headaches. She presented to the [facility] ED this morning for constellation of symptoms including non-intractable nausea, vomiting, headache, shortness of breath, cough, chills, weakness, generalized abdominal pain, and diarrhea. Symptoms started 3-days ago. Upon presentation to the ED, labs were significant for WBC 12.2K, lactic acid 3.9, INR 1.0, PTT 24, troponin 26, lipase 23, BUN 36, serum creatinine 1.12, negative COVID-19 PCR, positive influenza A, and hemoglobin >14. Vital signs were significant for tachycardia with HR up to 164, RR 24, BP 145/115, and O2 sats 98% on room air. She was started on cefepime due to sepsis protocol. Chest x-ray negative. CT angiogram negative for pulmonary embolism. CT abd/pelvis showed fluid filled colon c/w enteritis. She reports diarrhea started on Wednesday of this week and reports going 5-6 times daily with a very loose consistency. When she woke up this morning she noticed rectal bleeding which was bright red in color and this concerned her. She has had four episodes of diarrhea since being in the ED and reports sometimes she has passed just bright red blood. No previous hx of GI bleeding. She reports she did take a couple [place] powders this week due to feeling so poorly. Her appetite has been reduced and she hasn't been eating well. She is able to keep down liquids fine. Previous colonoscopy >10 years ago and reports this was normal.    Past Medical History:  Past Medical History:  Diagnosis Date   Anxiety     PANIC ATTACKS   Bell palsy    Cancer ([place])    VULVAR   Chronic headaches    Depression    Hernia    Hypertension    Pancreatitis, acute    Sinusitis    Wrist fracture     Problem List: Patient Active Problem List   Diagnosis Date Noted   Influenza A 08/16/2021   Nausea vomiting and diarrhea 08/16/2021   Depression with anxiety 08/16/2021   HTN (hypertension) 08/16/2021   GERD (gastroesophageal reflux disease) 08/16/2021   Chest pain 08/16/2021   Elevated troponin 08/16/2021   Sepsis ([place]) 08/16/2021   AKI (acute kidney injury) ([place]) 08/16/2021   Hyperglycemia 08/16/2021   Major depressive disorder, recurrent, severe with psychotic features ([place])    Acute gastritis 05/16/2015   Hypertensive urgency 05/13/2015   Facial droop 05/13/2015   Tobacco use disorder 05/10/2015   Cannabis use disorder, moderate, dependence ([place]) 05/10/2015   Major depressive disorder, recurrent severe without psychotic features ([place]) 95/28/4132   Complicated grief 44/08/270    Past Surgical History: Past Surgical History:  Procedure Laterality Date   ABDOMINAL HYSTERECTOMY     CATARACT EXTRACTION W/PHACO Right 10/27/2018   Procedure: CATARACT EXTRACTION PHACO AND INTRAOCULAR LENS PLACEMENT (IOC)-RIGHT 1ST CASE PER POSTING SHEET;  Surgeon: Leandrew Koyanagi, MD;  Location: [facility] ORS;  Service: Ophthalmology;  Laterality: Right;  [place]  01:43 CDE 16.55 AP% 12.2 Fluid pack lot # [id number]   CATARACT EXTRACTION W/PHACO Left 04/30/2021   Procedure: CATARACT EXTRACTION PHACO AND INTRAOCULAR LENS PLACEMENT ([place]) LEFT VISION BLUE MILOOP;  Surgeon:  Birder Robson, MD;  Location: [facility];  Service: Ophthalmology;  Laterality: Left;  17.19 01:14.6   CESAREAN SECTION      Allergies: Allergies  Allergen Reactions   Pantoprazole Sodium Other (See Comments)    Makes patient feel sick all over (per patient who spoke with MD)    Home Medications: (Not in a hospital admission)  Home  medication reconciliation was completed with the patient.   Scheduled Inpatient Medications:    diphenhydrAMINE       insulin aspart  0-5 Units Subcutaneous QHS   insulin aspart  0-9 Units Subcutaneous TID WC   nicotine  21 mg Transdermal Daily   oseltamivir  75 mg Oral BID    Continuous Inpatient Infusions:    famotidine (PEPCID) IV     lactated ringers      PRN Inpatient Medications:  acetaminophen, albuterol, dextromethorphan-guaiFENesin, diphenhydrAMINE, hydrALAZINE, LORazepam, morphine injection  Family History: family history includes Cancer in her father; Diabetes in an other family member.  The patient's family history is negative for inflammatory bowel disorders, GI malignancy, or solid organ transplantation.  Social History:   reports that she has been smoking cigarettes. She has a 20.00 pack-year smoking history. She has never used smokeless tobacco. She reports current drug use. Drug: Marijuana. She reports that she does not drink alcohol. The patient denies ETOH, tobacco, or drug use.   Review of Systems: Constitutional: Weight is stable.  Eyes: No changes in vision. ENT: No oral lesions, + sore throat  GI: see HPI.  Heme/Lymph: No easy bruising.  CV: No chest pain. +SOB. +Cough.  GU: No hematuria.  Integumentary: No rashes.  Neuro: No headaches.  Psych: No depression/anxiety.  Endocrine: No heat/cold intolerance.  Allergic/Immunologic: No urticaria.  Resp: +cough, +sob Musculoskeletal: No joint swelling.    Physical Examination: BP (!) 139/107 (BP Location: Right Arm)    Pulse (!) 112 Comment: pt back to bed after having went to restroom; pt was already aware BM and urine sample needed when possible   Temp 97.8 F (36.6 C) (Oral)    Resp (!) 21    Ht 5\' 6"  (1.676 m)    Wt 74.8 kg    SpO2 99%    BMI 26.63 kg/m  Gen: NAD, alert and oriented x 4 HEENT: PEERLA, EOMI, Neck: supple, no JVD or thyromegaly Chest: CTA bilaterally, no wheezes, crackles, or other  adventitious sounds. No accessory muscle use.  CV: tachycardic, no murmur, rub, or gallop Abd: soft, NT, ND, +BS in all four quadrants; no HSM, guarding, ridigity, or rebound tenderness Ext: no edema, well perfused with 2+ pulses, Skin: no rash or lesions noted Lymph: no LAD  Data: Lab Results  Component Value Date   WBC 11.4 (H) 08/16/2021   HGB 13.3 08/16/2021   HCT 39.0 08/16/2021   MCV 93.8 08/16/2021   PLT 141 (L) 08/16/2021   Recent Labs  Lab 08/16/21 1046 08/16/21 1435  HGB 15.4* 13.3   Lab Results  Component Value Date   NA 132 (L) 08/16/2021   K 3.5 08/16/2021   CL 91 (L) 08/16/2021   CO2 27 08/16/2021   BUN 36 (H) 08/16/2021   CREATININE 1.12 (H) 08/16/2021   Lab Results  Component Value Date   ALT 32 08/16/2021   AST 33 08/16/2021   ALKPHOS 45 08/16/2021   BILITOT 1.4 (H) 08/16/2021   Recent Labs  Lab 08/16/21 1150  APTT 24  INR 1.0   Assessment/Plan:  55 y/o Caucasian  female with a PMH of HTN, depression with anxiety, GERD, hx of pancreatitis, hx of vulvar cancer, Bell's palsy, cannabinoid use disorder, tobacco abuse, and chronic headaches presented to the [facility] ED for constellation of symptoms, including chest pain, shortness of breath, cough, chills, headache, weakness, generalized abdominal pain, diarrhea, and rectal bleeding.   Diarrhea/Rectal bleeding - most consistent with diagnosis of self-limited ischemic colitis in setting of dehydration/sepsis 2/2 Influenza A. Differential also includes viral/bacterial gastroenteritis, inflammatory colitis, occult GI malignancy, small bowel enteritis, IBS, SIBO, functional diarrhea, etc.   Sepsis - 2/2 Influenza A  Non-intractable nausea and vomiting   AKI - 2/2 dehydration from influenza A  Tobacco use disorder/NSAID use  Recommendations:  - Continue to monitor H&H closely. Transfuse for Hgb <7.0.  - Collect infectious stool studies - GI PCR and C diff toxin - Continue supportive care with IV fluid  hydration, pain control, and antiemetics as needed - Continue treatment per sepsis protocol - Continue serial abdominal examinations  - Depending on her clinical progress over the weekend, can consider luminal evaluation with colonoscopy  - Focus on preventing dehydration and fasting to make another episode of ischemic colitis less likely  - Dr. Alice Reichert and myself following along with you this weekend    Thank you for the consult. Please call with questions or concerns.  Reeves Forth [facility] [phone] (Cell)

## 2021-08-16 NOTE — ED Notes (Signed)
Pt up to restroom attempting to provide stool sample.

## 2021-08-16 NOTE — Progress Notes (Addendum)
CODE SEPSIS - PHARMACY COMMUNICATION  **Broad Spectrum Antibiotics should be administered within 1 hour of Sepsis diagnosis**  Time Code Sepsis Called/Page Received: 12/30 at 1148 (page)  Antibiotics Ordered: Vanc, Cefepime, metro  Time of 1st antibiotic administration:  1150  Additional action taken by pharmacy:    If necessary, Name of Provider/Nurse Contacted:      Noralee Space ,PharmD Clinical Pharmacist  08/16/2021  11:48 AM

## 2021-08-16 NOTE — ED Notes (Signed)
Provider Niu notified of critical trop 96. Pt denies CP "except for soreness when I cough" per pt.

## 2021-08-16 NOTE — ED Notes (Signed)
Pt leaving for imaging. Pt states CP has dec.

## 2021-08-16 NOTE — ED Notes (Addendum)
Pt assisted to restroom using wheelchair and given bathwipes as had accident (BM) on self. Given new briefs and pads.

## 2021-08-16 NOTE — ED Notes (Signed)
EDP Z. Tamala Julian notified in person of lactic 3.9.

## 2021-08-16 NOTE — ED Notes (Addendum)
Pt assisted to restroom. Attempted to collect urine sample but pt unable. PT SOB, dizzy, and diaphoretic. Denies pain. Pt showed blood on tissue paper from wiping her "back side". Denies history of hemorrhoids. Denies anticoagulants or "blood thinners". Pt assisted back on stretcher. Stretcher locked low. Rails up. Visitor at bedside.

## 2021-08-16 NOTE — Progress Notes (Signed)
Elink following for sepsis protocol. 

## 2021-08-16 NOTE — ED Notes (Addendum)
Called lab about missing trop result since repeat sent with other blood at 1515. Reported just resulted at 66. Provider Blaine Hamper notified via secure chat. No new orders. Pt denies CP currently. Pt has cough. Pt given food tray by dietary but states isn't hungry currently.

## 2021-08-16 NOTE — ED Notes (Signed)
Pt assisted to restroom; pt attempting to provide urine sample.

## 2021-08-16 NOTE — ED Notes (Signed)
Attending at bedside.

## 2021-08-16 NOTE — ED Notes (Signed)
Bag of LR started at 11:58 by Norberta Keens started and continued as bolus; 150cc/hr will be started once bolus runs are finished.

## 2021-08-16 NOTE — ED Notes (Signed)
Pt's husband [name] Retail banker wanting update. Pt given hospital phone and notified he called and wanted an update. Pt speaking with husband over phone currently.

## 2021-08-17 ENCOUNTER — Encounter: Payer: Self-pay | Admitting: Internal Medicine

## 2021-08-17 DIAGNOSIS — F17200 Nicotine dependence, unspecified, uncomplicated: Secondary | ICD-10-CM

## 2021-08-17 DIAGNOSIS — D3501 Benign neoplasm of right adrenal gland: Secondary | ICD-10-CM

## 2021-08-17 DIAGNOSIS — J101 Influenza due to other identified influenza virus with other respiratory manifestations: Secondary | ICD-10-CM | POA: Diagnosis not present

## 2021-08-17 DIAGNOSIS — F418 Other specified anxiety disorders: Secondary | ICD-10-CM | POA: Diagnosis not present

## 2021-08-17 DIAGNOSIS — N179 Acute kidney failure, unspecified: Secondary | ICD-10-CM | POA: Diagnosis not present

## 2021-08-17 DIAGNOSIS — K921 Melena: Secondary | ICD-10-CM

## 2021-08-17 LAB — CBC
HCT: 32.3 % — ABNORMAL LOW (ref 36.0–46.0)
HCT: 32.7 % — ABNORMAL LOW (ref 36.0–46.0)
HCT: 32.4 % — ABNORMAL LOW (ref 36.0–46.0)
HCT: 29.9 % — ABNORMAL LOW (ref 36.0–46.0)
Hemoglobin: 10.9 g/dL — ABNORMAL LOW (ref 12.0–15.0)
Hemoglobin: 11.5 g/dL — ABNORMAL LOW (ref 12.0–15.0)
Hemoglobin: 11.2 g/dL — ABNORMAL LOW (ref 12.0–15.0)
Hemoglobin: 10.2 g/dL — ABNORMAL LOW (ref 12.0–15.0)
MCH: 31.6 pg (ref 26.0–34.0)
MCH: 32.8 pg (ref 26.0–34.0)
MCH: 31.7 pg (ref 26.0–34.0)
MCH: 32 pg (ref 26.0–34.0)
MCHC: 33.7 g/dL (ref 30.0–36.0)
MCHC: 35.2 g/dL (ref 30.0–36.0)
MCHC: 34.6 g/dL (ref 30.0–36.0)
MCHC: 34.1 g/dL (ref 30.0–36.0)
MCV: 93.6 fL (ref 80.0–100.0)
MCV: 93.2 fL (ref 80.0–100.0)
MCV: 91.8 fL (ref 80.0–100.0)
MCV: 93.7 fL (ref 80.0–100.0)
Platelets: 128 10*3/uL — ABNORMAL LOW (ref 150–400)
Platelets: 125 10*3/uL — ABNORMAL LOW (ref 150–400)
Platelets: 131 10*3/uL — ABNORMAL LOW (ref 150–400)
Platelets: 116 10*3/uL — ABNORMAL LOW (ref 150–400)
RBC: 3.45 MIL/uL — ABNORMAL LOW (ref 3.87–5.11)
RBC: 3.51 MIL/uL — ABNORMAL LOW (ref 3.87–5.11)
RBC: 3.53 MIL/uL — ABNORMAL LOW (ref 3.87–5.11)
RBC: 3.19 MIL/uL — ABNORMAL LOW (ref 3.87–5.11)
RDW: 12.6 % (ref 11.5–15.5)
RDW: 12.6 % (ref 11.5–15.5)
RDW: 12.5 % (ref 11.5–15.5)
RDW: 12.5 % (ref 11.5–15.5)
WBC: 9.7 10*3/uL (ref 4.0–10.5)
WBC: 10.4 10*3/uL (ref 4.0–10.5)
WBC: 8.9 10*3/uL (ref 4.0–10.5)
WBC: 6.2 10*3/uL (ref 4.0–10.5)
nRBC: 0 % (ref 0.0–0.2)
nRBC: 0 % (ref 0.0–0.2)
nRBC: 0 % (ref 0.0–0.2)
nRBC: 0 % (ref 0.0–0.2)

## 2021-08-17 LAB — BASIC METABOLIC PANEL
Anion gap: 4 — ABNORMAL LOW (ref 5–15)
BUN: 23 mg/dL — ABNORMAL HIGH (ref 6–20)
CO2: 31 mmol/L (ref 22–32)
Calcium: 8.1 mg/dL — ABNORMAL LOW (ref 8.9–10.3)
Chloride: 100 mmol/L (ref 98–111)
Creatinine, Ser: 0.64 mg/dL (ref 0.44–1.00)
GFR, Estimated: >60 mL/min (ref >60)
Glucose, Bld: 135 mg/dL — ABNORMAL HIGH (ref 70–99)
Potassium: 3.1 mmol/L — ABNORMAL LOW (ref 3.5–5.1)
Sodium: 135 mmol/L (ref 135–145)

## 2021-08-17 LAB — GLUCOSE, CAPILLARY
Glucose-Capillary: 114 mg/dL — ABNORMAL HIGH (ref 70–99)
Glucose-Capillary: 123 mg/dL — ABNORMAL HIGH (ref 70–99)

## 2021-08-17 LAB — LIPID PANEL
Cholesterol: 214 mg/dL — ABNORMAL HIGH (ref 0–200)
HDL: 20 mg/dL — ABNORMAL LOW (ref >40)
LDL Cholesterol: 123 mg/dL — ABNORMAL HIGH (ref 0–99)
Total CHOL/HDL Ratio: 10.7 RATIO
Triglycerides: 354 mg/dL — ABNORMAL HIGH (ref <150)
VLDL: 71 mg/dL — ABNORMAL HIGH (ref 0–40)

## 2021-08-17 LAB — TROPONIN I (HIGH SENSITIVITY): Troponin I (High Sensitivity): 66 ng/L — ABNORMAL HIGH (ref <18)

## 2021-08-17 LAB — CBG MONITORING, ED
Glucose-Capillary: 129 mg/dL — ABNORMAL HIGH (ref 70–99)
Glucose-Capillary: 119 mg/dL — ABNORMAL HIGH (ref 70–99)

## 2021-08-17 LAB — HIV ANTIBODY (ROUTINE TESTING W REFLEX): HIV Screen 4th Generation wRfx: NONREACTIVE

## 2021-08-17 MED ORDER — HYDROCODONE-ACETAMINOPHEN 5-325 MG PO TABS
1.0000 | ORAL_TABLET | Freq: Four times a day (QID) | ORAL | Status: DC | PRN
  Administered 2021-08-17 – 2021-08-18 (×3): 2 via ORAL
  Filled 2021-08-17 (×3): qty 2

## 2021-08-17 MED ORDER — PROCHLORPERAZINE EDISYLATE 10 MG/2ML IJ SOLN
10.0000 mg | Freq: Four times a day (QID) | INTRAMUSCULAR | Status: DC | PRN
  Filled 2021-08-17 (×2): qty 2

## 2021-08-17 MED ORDER — MORPHINE SULFATE (PF) 2 MG/ML IV SOLN: 2.0000 mg | INTRAVENOUS | Status: DC | PRN

## 2021-08-17 NOTE — Progress Notes (Signed)
[facility] Gastroenterology Inpatient Progress Note    Subjective: Patient seen for follow up acute colitis with rectal bleeding. Patient denies further bleeding after rest, IV fluids. No pain. "I have a little more energy".   Objective: Vital signs in last 24 hours: Temp:  [98.4 F (36.9 C)] 98.4 F (36.9 C) (12/31 0957) Pulse Rate:  [85-145] 95 (12/31 1121) Resp:  [16-24] 18 (12/31 1121) BP: (126-161)/(71-115) 140/88 (12/31 1121) SpO2:  [90 %-99 %] 96 % (12/31 1121) Blood pressure 140/88, pulse 95, temperature 98.4 F (36.9 C), temperature source Oral, resp. rate 18, height 5\' 6"  (1.676 m), weight 74.8 kg, SpO2 96 %.    Intake/Output from previous day: 12/30 0701 - 12/31 0700 In: 50 [IV Piggyback:50] Out: -   Intake/Output this shift: No intake/output data recorded.   Gen: NAD. Appears comfortable.  HEENT: [place]/AT. PERRLA. Normal external ear exam.  Chest: CTA, no wheezes.  CV: RR nl S1, S2. No gallops.  Abd: soft, nt, nd. BS+  Ext: no edema. Pulses 2+  Neuro: Alert and oriented. Judgement appears normal. Nonfocal.   Lab Results: Results for orders placed or performed during the hospital encounter of 08/16/21 (from the past 24 hour(s))  Protime-INR     Status: None   Collection Time: 08/16/21 11:50 AM  Result Value Ref Range   Prothrombin Time 13.4 11.4 - 15.2 seconds   INR 1.0 0.8 - 1.2  APTT     Status: None   Collection Time: 08/16/21 11:50 AM  Result Value Ref Range   aPTT 24 24 - 36 seconds  Hepatic function panel     Status: Abnormal   Collection Time: 08/16/21 11:50 AM  Result Value Ref Range   Total Protein 6.6 6.5 - 8.1 g/dL   Albumin 4.0 3.5 - 5.0 g/dL   AST 33 15 - 41 U/L   ALT 32 0 - 44 U/L   Alkaline Phosphatase 45 38 - 126 U/L   Total Bilirubin 1.4 (H) 0.3 - 1.2 mg/dL   Bilirubin, Direct 0.2 0.0 - 0.2 mg/dL   Indirect Bilirubin 1.2 (H) 0.3 - 0.9 mg/dL  Hemoglobin A1c     Status: Abnormal   Collection Time: 08/16/21  2:35 PM  Result  Value Ref Range   Hgb A1c MFr Bld 6.3 (H) 4.8 - 5.6 %   Mean Plasma Glucose 134.11 mg/dL  Troponin I (High Sensitivity)     Status: Abnormal   Collection Time: 08/16/21  2:35 PM  Result Value Ref Range   Troponin I (High Sensitivity) 75 (H) <18 ng/L  CBC     Status: Abnormal   Collection Time: 08/16/21  2:35 PM  Result Value Ref Range   WBC 11.4 (H) 4.0 - 10.5 K/uL   RBC 4.16 3.87 - 5.11 MIL/uL   Hemoglobin 13.3 12.0 - 15.0 g/dL   HCT 39.0 36.0 - 46.0 %   MCV 93.8 80.0 - 100.0 fL   MCH 32.0 26.0 - 34.0 pg   MCHC 34.1 30.0 - 36.0 g/dL   RDW 12.5 11.5 - 15.5 %   Platelets 141 (L) 150 - 400 K/uL   nRBC 0.0 0.0 - 0.2 %  Lactic acid, plasma     Status: None   Collection Time: 08/16/21  3:16 PM  Result Value Ref Range   Lactic Acid, Venous 1.9 0.5 - 1.9 mmol/L  Type and screen [place]     Status: None   Collection Time: 08/16/21  3:16 PM  Result  Value Ref Range   ABO/RH(D) O POS    Antibody Screen NEG    Sample Expiration      08/19/2021,2359 Performed at [facility], [address] [place],  [address]   CBG monitoring, ED     Status: Abnormal   Collection Time: 08/16/21  4:08 PM  Result Value Ref Range   Glucose-Capillary 130 (H) 70 - 99 mg/dL  Troponin I (High Sensitivity)     Status: Abnormal   Collection Time: 08/16/21  5:28 PM  Result Value Ref Range   Troponin I (High Sensitivity) 96 (H) <18 ng/L  CBG monitoring, ED     Status: Abnormal   Collection Time: 08/16/21  6:58 PM  Result Value Ref Range   Glucose-Capillary 132 (H) 70 - 99 mg/dL  Urinalysis, Complete w Microscopic     Status: Abnormal   Collection Time: 08/16/21  8:00 PM  Result Value Ref Range   Color, Urine YELLOW (A) YELLOW   APPearance CLEAR (A) CLEAR   Specific Gravity, Urine 1.029 1.005 - 1.030   pH 7.0 5.0 - 8.0   Glucose, UA 50 (A) NEGATIVE mg/dL   Hgb urine dipstick MODERATE (A) NEGATIVE   Bilirubin Urine NEGATIVE NEGATIVE   Ketones, ur 5 (A) NEGATIVE  mg/dL   Protein, ur 30 (A) NEGATIVE mg/dL   Nitrite NEGATIVE NEGATIVE   Leukocytes,Ua NEGATIVE NEGATIVE   RBC / HPF 0-5 0 - 5 RBC/hpf   WBC, UA 0-5 0 - 5 WBC/hpf   Bacteria, UA RARE (A) NONE SEEN   Squamous Epithelial / LPF 0-5 0 - 5   Mucus PRESENT   Urine Drug Screen, Qualitative ([facility] only)     Status: Abnormal   Collection Time: 08/16/21  8:00 PM  Result Value Ref Range   Tricyclic, Ur Screen NONE DETECTED NONE DETECTED   Amphetamines, Ur Screen NONE DETECTED NONE DETECTED   MDMA (Ecstasy)Ur Screen NONE DETECTED NONE DETECTED   Cocaine Metabolite,Ur [place] NONE DETECTED NONE DETECTED   Opiate, Ur Screen NONE DETECTED NONE DETECTED   Phencyclidine (PCP) Ur S NONE DETECTED NONE DETECTED   Cannabinoid 50 Ng, Ur [place] POSITIVE (A) NONE DETECTED   Barbiturates, Ur Screen NONE DETECTED NONE DETECTED   Benzodiazepine, Ur Scrn POSITIVE (A) NONE DETECTED   Methadone Scn, Ur NONE DETECTED NONE DETECTED  CBG monitoring, ED     Status: Abnormal   Collection Time: 08/16/21  8:12 PM  Result Value Ref Range   Glucose-Capillary 141 (H) 70 - 99 mg/dL  CBC     Status: Abnormal   Collection Time: 08/16/21 10:28 PM  Result Value Ref Range   WBC 9.8 4.0 - 10.5 K/uL   RBC 3.47 (L) 3.87 - 5.11 MIL/uL   Hemoglobin 11.4 (L) 12.0 - 15.0 g/dL   HCT 33.2 (L) 36.0 - 46.0 %   MCV 95.7 80.0 - 100.0 fL   MCH 32.9 26.0 - 34.0 pg   MCHC 34.3 30.0 - 36.0 g/dL   RDW 12.7 11.5 - 15.5 %   Platelets 118 (L) 150 - 400 K/uL   nRBC 0.0 0.0 - 0.2 %  Troponin I (High Sensitivity)     Status: Abnormal   Collection Time: 08/16/21 10:28 PM  Result Value Ref Range   Troponin I (High Sensitivity) 72 (H) <18 ng/L  CBG monitoring, ED     Status: Abnormal   Collection Time: 08/16/21 10:37 PM  Result Value Ref Range   Glucose-Capillary 128 (H) 70 - 99 mg/dL  HIV Antibody (routine testing w rflx)     Status: None   Collection Time: 08/17/21 12:32 AM  Result Value Ref Range   HIV Screen 4th Generation wRfx Non Reactive Non  Reactive  Troponin I (High Sensitivity)     Status: Abnormal   Collection Time: 08/17/21 12:32 AM  Result Value Ref Range   Troponin I (High Sensitivity) 66 (H) <18 ng/L  Lipid panel     Status: Abnormal   Collection Time: 08/17/21  5:00 AM  Result Value Ref Range   Cholesterol 214 (H) 0 - 200 mg/dL   Triglycerides 354 (H) <150 mg/dL   HDL 20 (L) >40 mg/dL   Total CHOL/HDL Ratio 10.7 RATIO   VLDL 71 (H) 0 - 40 mg/dL   LDL Cholesterol 123 (H) 0 - 99 mg/dL  Basic metabolic panel     Status: Abnormal   Collection Time: 08/17/21  5:00 AM  Result Value Ref Range   Sodium 135 135 - 145 mmol/L   Potassium 3.1 (L) 3.5 - 5.1 mmol/L   Chloride 100 98 - 111 mmol/L   CO2 31 22 - 32 mmol/L   Glucose, Bld 135 (H) 70 - 99 mg/dL   BUN 23 (H) 6 - 20 mg/dL   Creatinine, Ser 0.64 0.44 - 1.00 mg/dL   Calcium 8.1 (L) 8.9 - 10.3 mg/dL   GFR, Estimated >60 >60 mL/min   Anion gap 4 (L) 5 - 15  CBC     Status: Abnormal   Collection Time: 08/17/21  5:00 AM  Result Value Ref Range   WBC 9.7 4.0 - 10.5 K/uL   RBC 3.45 (L) 3.87 - 5.11 MIL/uL   Hemoglobin 10.9 (L) 12.0 - 15.0 g/dL   HCT 32.3 (L) 36.0 - 46.0 %   MCV 93.6 80.0 - 100.0 fL   MCH 31.6 26.0 - 34.0 pg   MCHC 33.7 30.0 - 36.0 g/dL   RDW 12.6 11.5 - 15.5 %   Platelets 128 (L) 150 - 400 K/uL   nRBC 0.0 0.0 - 0.2 %  CBG monitoring, ED     Status: Abnormal   Collection Time: 08/17/21  7:38 AM  Result Value Ref Range   Glucose-Capillary 129 (H) 70 - 99 mg/dL  CBC     Status: Abnormal   Collection Time: 08/17/21  7:53 AM  Result Value Ref Range   WBC 10.4 4.0 - 10.5 K/uL   RBC 3.51 (L) 3.87 - 5.11 MIL/uL   Hemoglobin 11.5 (L) 12.0 - 15.0 g/dL   HCT 32.7 (L) 36.0 - 46.0 %   MCV 93.2 80.0 - 100.0 fL   MCH 32.8 26.0 - 34.0 pg   MCHC 35.2 30.0 - 36.0 g/dL   RDW 12.6 11.5 - 15.5 %   Platelets 125 (L) 150 - 400 K/uL   nRBC 0.0 0.0 - 0.2 %     Recent Labs    08/16/21 2228 08/17/21 0500 08/17/21 0753  WBC 9.8 9.7 10.4  HGB 11.4* 10.9*  11.5*  HCT 33.2* 32.3* 32.7*  PLT 118* 128* 125*   BMET Recent Labs    08/16/21 1046 08/17/21 0500  NA 132* 135  K 3.5 3.1*  CL 91* 100  CO2 27 31  GLUCOSE 282* 135*  BUN 36* 23*  CREATININE 1.12* 0.64  CALCIUM 9.0 8.1*   LFT Recent Labs    08/16/21 1150  PROT 6.6  ALBUMIN 4.0  AST 33  ALT 32  ALKPHOS 45  BILITOT 1.4*  BILIDIR 0.2  IBILI 1.2*   PT/INR Recent Labs    08/16/21 1150  LABPROT 13.4  INR 1.0   Hepatitis Panel No results for input(s): HEPBSAG, HCVAB, HEPAIGM, HEPBIGM in the last 72 hours. C-Diff No results for input(s): CDIFFTOX in the last 72 hours. No results for input(s): CDIFFPCR in the last 72 hours.   Studies/Results: DG Chest 2 View  Result Date: 08/16/2021 CLINICAL DATA:  Chest pain.  Shortness of breath. EXAM: CHEST - 2 VIEW COMPARISON:  February 11, 2021. FINDINGS: The heart size and mediastinal contours are within normal limits. Both lungs are clear. The visualized skeletal structures are unremarkable. IMPRESSION: No active cardiopulmonary disease. Electronically Signed   By: Marijo Conception M.D.   On: 08/16/2021 11:10   CT Angio Chest PE W and/or Wo Contrast  Result Date: 08/16/2021 CLINICAL DATA:  55 year old female with history of shortness of breath. EXAM: CT ANGIOGRAPHY CHEST WITH CONTRAST TECHNIQUE: Multidetector CT imaging of the chest was performed using the standard protocol during bolus administration of intravenous contrast. Multiplanar CT image reconstructions and MIPs were obtained to evaluate the vascular anatomy. CONTRAST:  100 mL Omnipaque 350, intravenous COMPARISON:  05/09/2010 FINDINGS: Cardiovascular: Satisfactory opacification of the pulmonary arteries to the segmental level. No evidence of pulmonary embolism. Normal heart size. No pericardial effusion. Mild scattered atherosclerotic calcification of the thoracic aorta. Mediastinum/Nodes: No enlarged mediastinal, hilar, or axillary lymph nodes. Thyroid gland, trachea, and  esophagus demonstrate no significant findings. Lungs/Pleura: Moderate upper lobe predominant centrilobular emphysema. Mild diffuse bronchial wall thickening. No suspicious pulmonary nodules. No focal consolidations, pleural effusion, or pneumothorax. Upper Abdomen: Please refer to the dedicated CT abdomen pelvis from the same day for intra abdominal findings. Musculoskeletal: No chest wall abnormality. No acute or significant osseous findings. Review of the MIP images confirms the above findings. IMPRESSION: Vascular: 1. No evidence of pulmonary embolism. 2.  Aortic Atherosclerosis (ICD10-I70.0). Non-Vascular: 1. No acute intrathoracic abnormality. 2. Chronic smoking related changes including emphysema (ICD10-J43.9) and bronchial wall thickening. Ruthann Cancer, MD [facility] Radiology Electronically Signed   By: Ruthann Cancer M.D.   On: 08/16/2021 12:41   CT ABDOMEN PELVIS W CONTRAST  Result Date: 08/16/2021 CLINICAL DATA:  Abdominal pain, acute, nonlocalized EXAM: CT ABDOMEN AND PELVIS WITH CONTRAST TECHNIQUE: Multidetector CT imaging of the abdomen and pelvis was performed using the standard protocol following bolus administration of intravenous contrast. CONTRAST:  129mL OMNIPAQUE IOHEXOL 350 MG/ML SOLN COMPARISON:  2014 FINDINGS: Lower chest: Dictated separately. Hepatobiliary: Perifissural focal fat. Liver is otherwise unremarkable. Gallbladder is unremarkable. No biliary dilatation. Pancreas: Unremarkable. Spleen: Unremarkable. Adrenals/Urinary Tract: Heterogeneous right adrenal lesion has been present on multiple remote studies but has slightly increased in size. Left adrenal is unremarkable. Kidneys are unremarkable. The bladder is poorly distended. Stomach/Bowel: Stomach is within normal limits. Bowel is normal in caliber. Normal appendix. Fluid-filled colon with air-fluid levels. Vascular/Lymphatic: Atherosclerosis.  No enlarged nodes. Reproductive: No  pelvic mass. Other: No free fluid.  Abdominal wall is unremarkable. Musculoskeletal: Degenerative disc disease at L5-S1. IMPRESSION: Fluid-filled colon with air-fluid levels consistent with reported history of diarrhea. Slight increase in size of heterogeneous right adrenal lesion. Given presence on multiple remote studies probably represents slow growth of an adenoma. Aortic atherosclerosis. Electronically Signed   By: Macy Mis M.D.   On: 08/16/2021 12:50    Scheduled Inpatient Medications:    insulin aspart  0-5 Units Subcutaneous QHS   insulin aspart  0-9 Units Subcutaneous TID WC  nicotine  21 mg Transdermal Daily   oseltamivir  75 mg Oral BID    Continuous Inpatient Infusions:    famotidine (PEPCID) IV 20 mg (08/17/21 0957)   lactated ringers 125 mL/hr at 08/17/21 0302    PRN Inpatient Medications:  acetaminophen, albuterol, alum & mag hydroxide-simeth, dextromethorphan-guaiFENesin, diphenhydrAMINE, hydrALAZINE, LORazepam, morphine injection  Miscellaneous: N/A.  Assessment:  Acute lower GI bleeding (resolved), likely secondary to ischemic colitis. Hemodynamically insignificant. Hgb stable at 11.5. Influenza, mildly clinically improved. DM Type II, insulin-requring. Mild obesity. Hypokalemia.  Plan:  I would advise outpatient colonoscopy given age and for elective evaluation of lower gastrointestinal bleeding which has seemingly resolved. No inpatient procedures are deemed necessary at this time. I would like to see the patient in the office within one month of hospital discharge. [phone].  Advance diet as tolerated. Maintain generous oral hydration. Discharge home okay from a GI standpoint. Will sign off. Call back if any changes occur or help needed.  [name] K. [name], M.D. 08/17/2021, 11:26 AM

## 2021-08-17 NOTE — Assessment & Plan Note (Addendum)
Possibly secondary to infectious gastroenteritis vs possible ischemic colitis. GI consulted on admission -Follow-up GI pathogen panel and C difficile screen -Ativan prn nausea -Will add Compazine secondary to prolonged QTc

## 2021-08-17 NOTE — Assessment & Plan Note (Signed)
Continue Pepcid  

## 2021-08-17 NOTE — Assessment & Plan Note (Signed)
Noted on CT. Slight increase in size. Will need outpatient follow-up

## 2021-08-17 NOTE — Hospital Course (Signed)
[name] is a lage female with a history of  hypertension, GERD, depression with anxiety, pancreatitis, vulvar cancer, Bell's palsy, hernia, cannabinoids use disorder, tobacco abuse. Patient presented secondary to nausea, vomiting, diarrhea, abdominal/chest pain. Patient found to have evidence of an acute GI illness, possibly infectious vs ischemic colitis in setting of influenza A infection. Associated hematochezia. GI consulted. Tamiflu started.

## 2021-08-17 NOTE — Assessment & Plan Note (Signed)
Continue nicotine patch  °

## 2021-08-17 NOTE — Assessment & Plan Note (Signed)
Infectious vs ischemic per GI assessment. CT abdomen/pelvis without evidence of diverticulosis. Hemoglobin stable. -Serial CBC checks

## 2021-08-17 NOTE — Assessment & Plan Note (Signed)
Mild elevation with a peak of 75. In setting of acute illness and likely related to demand ischemia. EKG with prolonged QTc but no acute ST-T segment changes.

## 2021-08-17 NOTE — Assessment & Plan Note (Signed)
-  Continue Tamiflu

## 2021-08-17 NOTE — Assessment & Plan Note (Signed)
Baseline creatinine of 0.6-0.7. Creatinine up to 1.12 on admission likely secondary to dehydration/hypovolemia. Resolved with IV fluids.

## 2021-08-17 NOTE — Assessment & Plan Note (Signed)
Not on outpatient medication management. Blood pressure while inpatient has been labile. -Continue hydralazine IV prn

## 2021-08-17 NOTE — Assessment & Plan Note (Signed)
Patient is not on outpatient medication at this time.

## 2021-08-17 NOTE — Progress Notes (Signed)
Pt to be admitted to room to [id number]. Report given to Isaiah Blakes., RN. Pt stable at this time, vss. Pt sent with belongings, transported by ED tech.

## 2021-08-17 NOTE — Assessment & Plan Note (Signed)
Hemoglobin A1C of 6.3, qualifying for prediabetes.  -Carb modified diet -Could consider metformin on discharge -Started on SSI while inpatient

## 2021-08-17 NOTE — Progress Notes (Signed)
PROGRESS NOTE    [name]  NOB:[id number] DOB: February 05, 1966 DOA: 08/16/2021 PCP: Pcp, No   Brief Narrative: [name] is a lage female with a history of  hypertension, GERD, depression with anxiety, pancreatitis, vulvar cancer, Bell's palsy, hernia, cannabinoids use disorder, tobacco abuse. Patient presented secondary to nausea, vomiting, diarrhea, abdominal/chest pain. Patient found to have evidence of an acute GI illness, possibly infectious vs ischemic colitis in setting of influenza A infection. Associated hematochezia. GI consulted. Tamiflu started.   Assessment & Plan:   * Influenza A- (present on admission) -Continue Tamiflu  Sepsis ([facility])- (present on admission) Present on admission. Secondary to influenza infection.  Nausea vomiting and diarrhea- (present on admission) Possibly secondary to infectious gastroenteritis vs possible ischemic colitis. GI consulted on admission -Follow-up GI pathogen panel and C difficile screen -Ativan prn nausea -Will add Compazine secondary to prolonged QTc  AKI (acute kidney injury) ([facility])-resolved as of 08/17/2021, (present on admission) Baseline creatinine of 0.6-0.7. Creatinine up to 1.12 on admission likely secondary to dehydration/hypovolemia. Resolved with IV fluids.  Chest pain-resolved as of 08/17/2021, (present on admission) Resolved.  Hematochezia Infectious vs ischemic per GI assessment. CT abdomen/pelvis without evidence of diverticulosis. Hemoglobin stable. -Serial CBC checks  Adrenal adenoma, right Noted on CT. Slight increase in size. Will need outpatient follow-up  Hyperglycemia- (present on admission) Hemoglobin A1C of 6.3, qualifying for prediabetes.  -Carb modified diet -Could consider metformin on discharge -Started on SSI while inpatient  Elevated troponin- (present on admission) Mild elevation with a peak of 75. In setting of acute illness and likely related to demand ischemia. EKG with prolonged QTc  but no acute ST-T segment changes.  GERD (gastroesophageal reflux disease)- (present on admission) -Continue Pepcid  HTN (hypertension)- (present on admission) Not on outpatient medication management. Blood pressure while inpatient has been labile. -Continue hydralazine IV prn  Depression with anxiety- (present on admission) Patient is not on outpatient medication at this time.  Tobacco use disorder- (present on admission) -Continue nicotine patch      DVT prophylaxis: SCDs Code Status:   Code Status: Full Code Family Communication: None at bedside Disposition Plan: Discharge home likely in 1-2 days pending continued stability of hemoglobin in addition to resolution of hematochezia   Consultants:  Gastroenterology  Procedures:  None  Antimicrobials: None    Subjective: Continued diarrhea with some nausea, prohibiting her from eating. Associated abdominal pain especially worse with coughing.  Objective: Vitals:   08/16/21 2258 08/17/21 0020 08/17/21 0238 08/17/21 0450  BP: (!) 161/96 (!) 157/84 139/82 (!) 126/96  Pulse: (!) 109 88 96 85  Resp: 17 18 18  (!) 21  Temp:      TempSrc:      SpO2: 95% 93% 95% 96%  Weight:      Height:        Intake/Output Summary (Last 24 hours) at 08/17/2021 0832 Last data filed at 08/17/2021 0050 Gross per 24 hour  Intake 50 ml  Output --  Net 50 ml   Filed Weights   08/16/21 1043  Weight: 74.8 kg    Examination:  General exam: Appears calm and comfortable Respiratory system: Clear to auscultation. Respiratory effort normal. Cardiovascular system: S1 & S2 heard, RRR. No murmurs, rubs, gallops or clicks. Gastrointestinal system: Abdomen is nondistended, soft and mildly tender. No organomegaly or masses felt. Normal bowel sounds heard. Central nervous system: Alert and oriented. No focal neurological deficits. Musculoskeletal: No edema. No calf tenderness Skin: No cyanosis. No rashes Psychiatry:  Judgement and insight  appear normal. Mood & affect appropriate.     Data Reviewed: I have personally reviewed following labs and imaging studies  CBC Lab Results  Component Value Date   WBC 9.7 08/17/2021   RBC 3.45 (L) 08/17/2021   HGB 10.9 (L) 08/17/2021   HCT 32.3 (L) 08/17/2021   MCV 93.6 08/17/2021   MCH 31.6 08/17/2021   PLT 128 (L) 08/17/2021   MCHC 33.7 08/17/2021   RDW 12.6 08/17/2021   LYMPHSABS 1.7 02/11/2021   MONOABS 0.7 02/11/2021   EOSABS 0.0 02/11/2021   BASOSABS 0.1 05/24/1218     Last metabolic panel Lab Results  Component Value Date   NA 135 08/17/2021   K 3.1 (L) 08/17/2021   CL 100 08/17/2021   CO2 31 08/17/2021   BUN 23 (H) 08/17/2021   CREATININE 0.64 08/17/2021   GLUCOSE 135 (H) 08/17/2021   GFRNONAA >60 08/17/2021   GFRAA >60 11/21/2018   CALCIUM 8.1 (L) 08/17/2021   PROT 6.6 08/16/2021   ALBUMIN 4.0 08/16/2021   BILITOT 1.4 (H) 08/16/2021   ALKPHOS 45 08/16/2021   AST 33 08/16/2021   ALT 32 08/16/2021   ANIONGAP 4 (L) 08/17/2021    CBG (last 3)  Recent Labs    08/16/21 2012 08/16/21 2237 08/17/21 0738  GLUCAP 141* 128* 129*     GFR: Estimated Creatinine Clearance: 82.2 mL/min (by C-G formula based on SCr of 0.64 mg/dL).  Coagulation Profile: Recent Labs  Lab 08/16/21 1150  INR 1.0    Recent Results (from the past 240 hour(s))  Resp Panel by RT-PCR (Flu A&B, Covid) Nasopharyngeal Swab     Status: Abnormal   Collection Time: 08/16/21 10:46 AM   Specimen: Nasopharyngeal Swab; Nasopharyngeal(NP) swabs in vial transport medium  Result Value Ref Range Status   SARS Coronavirus 2 by RT PCR NEGATIVE NEGATIVE Final    Comment: (NOTE) SARS-CoV-2 target nucleic acids are NOT DETECTED.  The SARS-CoV-2 RNA is generally detectable in upper respiratory specimens during the acute phase of infection. The lowest concentration of SARS-CoV-2 viral copies this assay can detect is 138 copies/mL. A negative result does not preclude SARS-Cov-2 infection and  should not be used as the sole basis for treatment or other patient management decisions. A negative result may occur with  improper specimen collection/handling, submission of specimen other than nasopharyngeal swab, presence of viral mutation(s) within the areas targeted by this assay, and inadequate number of viral copies(<138 copies/mL). A negative result must be combined with clinical observations, patient history, and epidemiological information. The expected result is Negative.  Fact Sheet for Patients:  EntrepreneurPulse.com.au  Fact Sheet for Healthcare Providers:  IncredibleEmployment.be  This test is no t yet approved or cleared by the [place] FDA and  has been authorized for detection and/or diagnosis of SARS-CoV-2 by FDA under an Emergency Use Authorization (EUA). This EUA will remain  in effect (meaning this test can be used) for the duration of the COVID-19 declaration under Section 564(b)(1) of the Act, 21 U.S.C.section 360bbb-3(b)(1), unless the authorization is terminated  or revoked sooner.       Influenza A by PCR POSITIVE (A) NEGATIVE Final   Influenza B by PCR NEGATIVE NEGATIVE Final    Comment: (NOTE) The Xpert Xpress SARS-CoV-2/FLU/RSV plus assay is intended as an aid in the diagnosis of influenza from Nasopharyngeal swab specimens and should not be used as a sole basis for treatment. Nasal washings and aspirates are unacceptable for Xpert Xpress SARS-CoV-2/FLU/RSV testing.  Fact Sheet for Patients: EntrepreneurPulse.com.au  Fact Sheet for Healthcare Providers: IncredibleEmployment.be  This test is not yet approved or cleared by the [place] FDA and has been authorized for detection and/or diagnosis of SARS-CoV-2 by FDA under an Emergency Use Authorization (EUA). This EUA will remain in effect (meaning this test can be used) for the duration of the COVID-19 declaration  under Section 564(b)(1) of the Act, 21 U.S.C. section 360bbb-3(b)(1), unless the authorization is terminated or revoked.  Performed at [facility], [address] [place], [place] [address]   Blood Culture (routine x 2)     Status: None (Preliminary result)   Collection Time: 08/16/21 11:19 AM   Specimen: BLOOD  Result Value Ref Range Status   Specimen Description BLOOD LEFT ANTECUBITAL  Final   Special Requests   Final    BOTTLES DRAWN AEROBIC AND ANAEROBIC Blood Culture adequate volume   Culture   Final    NO GROWTH < 24 HOURS Performed at [facility], [address] [place], [place] [address]    Report Status PENDING  Incomplete  Blood Culture (routine x 2)     Status: None (Preliminary result)   Collection Time: 08/16/21 11:24 AM   Specimen: BLOOD  Result Value Ref Range Status   Specimen Description BLOOD BLOOD RIGHT HAND  Final   Special Requests   Final    BOTTLES DRAWN AEROBIC AND ANAEROBIC Blood Culture adequate volume   Culture   Final    NO GROWTH < 24 HOURS Performed at [facility], [address] [place], [place] [address]    Report Status PENDING  Incomplete        Radiology Studies: DG Chest 2 View  Result Date: 08/16/2021 CLINICAL DATA:  Chest pain.  Shortness of breath. EXAM: CHEST - 2 VIEW COMPARISON:  February 11, 2021. FINDINGS: The heart size and mediastinal contours are within normal limits. Both lungs are clear. The visualized skeletal structures are unremarkable. IMPRESSION: No active cardiopulmonary disease. Electronically Signed   By: Marijo Conception M.D.   On: 08/16/2021 11:10   CT Angio Chest PE W and/or Wo Contrast  Result Date: 08/16/2021 CLINICAL DATA:  55 year old female with history of shortness of breath. EXAM: CT ANGIOGRAPHY CHEST WITH CONTRAST TECHNIQUE: Multidetector CT imaging of the chest was performed using the standard protocol during bolus administration of intravenous contrast. Multiplanar CT image  reconstructions and MIPs were obtained to evaluate the vascular anatomy. CONTRAST:  100 mL Omnipaque 350, intravenous COMPARISON:  05/09/2010 FINDINGS: Cardiovascular: Satisfactory opacification of the pulmonary arteries to the segmental level. No evidence of pulmonary embolism. Normal heart size. No pericardial effusion. Mild scattered atherosclerotic calcification of the thoracic aorta. Mediastinum/Nodes: No enlarged mediastinal, hilar, or axillary lymph nodes. Thyroid gland, trachea, and esophagus demonstrate no significant findings. Lungs/Pleura: Moderate upper lobe predominant centrilobular emphysema. Mild diffuse bronchial wall thickening. No suspicious pulmonary nodules. No focal consolidations, pleural effusion, or pneumothorax. Upper Abdomen: Please refer to the dedicated CT abdomen pelvis from the same day for intra abdominal findings. Musculoskeletal: No chest wall abnormality. No acute or significant osseous findings. Review of the MIP images confirms the above findings. IMPRESSION: Vascular: 1. No evidence of pulmonary embolism. 2.  Aortic Atherosclerosis (ICD10-I70.0). Non-Vascular: 1. No acute intrathoracic abnormality. 2. Chronic smoking related changes including emphysema (ICD10-J43.9) and bronchial wall thickening. Ruthann Cancer, MD [facility] Radiology Electronically Signed   By: Ruthann Cancer M.D.   On: 08/16/2021 12:41  CT ABDOMEN PELVIS W CONTRAST  Result Date: 08/16/2021 CLINICAL DATA:  Abdominal pain, acute, nonlocalized EXAM: CT ABDOMEN AND PELVIS WITH CONTRAST TECHNIQUE: Multidetector CT imaging of the abdomen and pelvis was performed using the standard protocol following bolus administration of intravenous contrast. CONTRAST:  137mL OMNIPAQUE IOHEXOL 350 MG/ML SOLN COMPARISON:  2014 FINDINGS: Lower chest: Dictated separately. Hepatobiliary: Perifissural focal fat. Liver is otherwise unremarkable. Gallbladder is unremarkable. No biliary  dilatation. Pancreas: Unremarkable. Spleen: Unremarkable. Adrenals/Urinary Tract: Heterogeneous right adrenal lesion has been present on multiple remote studies but has slightly increased in size. Left adrenal is unremarkable. Kidneys are unremarkable. The bladder is poorly distended. Stomach/Bowel: Stomach is within normal limits. Bowel is normal in caliber. Normal appendix. Fluid-filled colon with air-fluid levels. Vascular/Lymphatic: Atherosclerosis.  No enlarged nodes. Reproductive: No pelvic mass. Other: No free fluid.  Abdominal wall is unremarkable. Musculoskeletal: Degenerative disc disease at L5-S1. IMPRESSION: Fluid-filled colon with air-fluid levels consistent with reported history of diarrhea. Slight increase in size of heterogeneous right adrenal lesion. Given presence on multiple remote studies probably represents slow growth of an adenoma. Aortic atherosclerosis. Electronically Signed   By: Macy Mis M.D.   On: 08/16/2021 12:50        Scheduled Meds:  insulin aspart  0-5 Units Subcutaneous QHS   insulin aspart  0-9 Units Subcutaneous TID WC   nicotine  21 mg Transdermal Daily   oseltamivir  75 mg Oral BID   Continuous Infusions:  famotidine (PEPCID) IV Stopped (08/17/21 0050)   lactated ringers 125 mL/hr at 08/17/21 0302     LOS: 1 day     Cordelia Poche, MD [facility] 08/17/2021, 8:32 AM  If 7PM-7AM, please contact night-coverage [email or web address]

## 2021-08-17 NOTE — Assessment & Plan Note (Signed)
Present on admission. Secondary to influenza infection.

## 2021-08-17 NOTE — Assessment & Plan Note (Signed)
Resolved

## 2021-08-18 DIAGNOSIS — J101 Influenza due to other identified influenza virus with other respiratory manifestations: Secondary | ICD-10-CM | POA: Diagnosis not present

## 2021-08-18 LAB — CBC
HCT: 29.6 % — ABNORMAL LOW (ref 36.0–46.0)
Hemoglobin: 10.3 g/dL — ABNORMAL LOW (ref 12.0–15.0)
MCH: 32.3 pg (ref 26.0–34.0)
MCHC: 34.8 g/dL (ref 30.0–36.0)
MCV: 92.8 fL (ref 80.0–100.0)
Platelets: 122 10*3/uL — ABNORMAL LOW (ref 150–400)
RBC: 3.19 MIL/uL — ABNORMAL LOW (ref 3.87–5.11)
RDW: 12.4 % (ref 11.5–15.5)
WBC: 5.6 10*3/uL (ref 4.0–10.5)
nRBC: 0 % (ref 0.0–0.2)

## 2021-08-18 LAB — GLUCOSE, CAPILLARY: Glucose-Capillary: 111 mg/dL — ABNORMAL HIGH (ref 70–99)

## 2021-08-18 MED ORDER — ONDANSETRON HCL 4 MG PO TABS: 4.0000 mg | ORAL_TABLET | Freq: Every day | ORAL | 1 refills | Status: DC | PRN

## 2021-08-18 MED ORDER — OSELTAMIVIR PHOSPHATE 75 MG PO CAPS: 75.0000 mg | ORAL_CAPSULE | Freq: Two times a day (BID) | ORAL | 0 refills | Status: DC

## 2021-08-18 MED ORDER — GUAIFENESIN-CODEINE 100-10 MG/5ML PO SOLN
5.0000 mL | Freq: Three times a day (TID) | ORAL | 0 refills | Status: DC | PRN
Start: 2021-08-18 — End: 2022-03-18

## 2021-08-18 NOTE — Discharge Summary (Signed)
Physician Discharge Summary   Patient: [name] MRN: [id number] DOB: @DOB   Admit date:     08/16/2021  Discharge date: 08/18/21  Discharge Physician: Edwin Dada   PCP: Pcp, No   Recommendations at discharge: 1. Follow up with new PCP as soon as able 2. Follow up with Gastroenterology, Dr. Alice Reichert as directed        Discharge Diagnoses Principal Problem:   Influenza A Active Problems:   Tobacco use disorder   Nausea vomiting and diarrhea   Depression with anxiety   HTN (hypertension)   GERD (gastroesophageal reflux disease)   Elevated troponin   Sepsis ([place])   Hyperglycemia   Adrenal adenoma, right   [name] is a 56 y.o. female with a history of  hypertension, GERD, depression with anxiety, pancreatitis, vulvar cancer, Bell's palsy, hernia, cannabinoids use disorder, tobacco abuse who presented with nausea, vomiting, diarrhea with hematochezia.   Found to have influenza A infection. Associated hematochezia. GI consulted. Tamiflu started.      * Influenza A- (present on admission) Treated with Tamiflu.  Discharged to complete a 5 day course.  Hematochezia Had an episode of hematochezia prior to admission.  No stools here, could not test for GI pathogens.  CT abdomen/pelvis without evidence of diverticulosis.   GI consulted, recommended nonurgent colonoscopy.  Her Hemoglobin was monitored serially for 48 hours and remained stable without further episodes.     Pre-diabetes Hemoglobin A1C of 6.3  Sepsis ([place])- (present on admission) Present on admission. Secondary to influenza infection.  Elevated troponin- (present on admission) Mild elevation with a peak of 75. In setting of acute illness and likely related to demand ischemia. EKG with prolonged QTc but no acute ST-T segment changes.  HTN (hypertension)- (present on admission) Not on outpatient medication management. Blood pressure while inpatient  has been labile. -Continue hydralazine IV prn  Depression with anxiety- (present on admission) Patient is not on outpatient medication at this time.  Tobacco use disorder- (present on admission) -Continue nicotine patch  AKI ruled out       Pain control - [employer] Controlled Substance Reporting System database was reviewed. and patient was instructed, not to drive, operate heavy machinery, perform activities at heights, swimming or participation in water activities or provide baby-sitting services while on Pain, Sleep and Anxiety Medications; until their outpatient Physician has advised to do so again. Also recommended to not to take more than prescribed Pain, Sleep and Anxiety Medications.   Consultants: GI Disposition: Home Diet recommendation: Regular diet  DISCHARGE MEDICATION: Allergies as of 08/18/2021       Reactions   Pantoprazole Sodium Other (See Comments)   Makes patient feel sick all over (per patient who spoke with MD)        Medication List     TAKE these medications    albuterol 108 (90 Base) MCG/ACT inhaler Commonly known as: VENTOLIN HFA Inhale 1-2 puffs into the lungs every 6 (six) hours as needed for wheezing or shortness of breath.   Aspirin-Acetaminophen-Caffeine 500-325-65 MG Pack Take 1 packet by mouth 3 (three) times daily as needed (pain.).   diphenhydrAMINE 25 MG tablet Commonly known as: BENADRYL Take 25-50 mg by mouth 2 (two) times daily as needed for allergies or sleep.   guaiFENesin-codeine 100-10 MG/5ML syrup Take 5 mLs by mouth 3 (three) times daily as needed for cough.   ondansetron 4 MG tablet Commonly known as: Zofran Take  1 tablet (4 mg total) by mouth daily as needed for nausea or vomiting.   oseltamivir 75 MG capsule Commonly known as: TAMIFLU Take 1 capsule (75 mg total) by mouth 2 (two) times daily.        Follow-up Information     [place], Benay Pike, MD. Schedule an appointment as soon as possible for a visit  in 1 month(s).   Specialty: Gastroenterology Contact information: [place] [address] [phone]         Your primary care doctor. Schedule an appointment as soon as possible for a visit in 1 week(s).                  Discharge Exam: Filed Weights   08/16/21 1043  Weight: 74.8 kg   General: Pt is alert, awake, not in acute distress, appears tired, lying in bed. Cardiovascular: RRR, nl S1-S2, no murmurs appreciated.   No LE edema.   Respiratory: Normal respiratory rate and rhythm.  CTAB without rales or wheezes. Abdominal: Abdomen soft and with mild left-sided abdominal pain.  No distension or HSM.   Neuro/Psych: Strength symmetric in upper and lower extremities.  Judgment and insight appear normal.    Condition at discharge: good  The results of significant diagnostics from this hospitalization (including imaging, microbiology, ancillary and laboratory) are listed below for reference.   Imaging Studies: DG Chest 2 View  Result Date: 08/16/2021 CLINICAL DATA:  Chest pain.  Shortness of breath. EXAM: CHEST - 2 VIEW COMPARISON:  February 11, 2021. FINDINGS: The heart size and mediastinal contours are within normal limits. Both lungs are clear. The visualized skeletal structures are unremarkable. IMPRESSION: No active cardiopulmonary disease. Electronically Signed   By: Marijo Conception M.D.   On: 08/16/2021 11:10   CT Angio Chest PE W and/or Wo Contrast  Result Date: 08/16/2021 CLINICAL DATA:  56 year old female with history of shortness of breath. EXAM: CT ANGIOGRAPHY CHEST WITH CONTRAST TECHNIQUE: Multidetector CT imaging of the chest was performed using the standard protocol during bolus administration of intravenous contrast. Multiplanar CT image reconstructions and MIPs were obtained to evaluate the vascular anatomy. CONTRAST:  100 mL Omnipaque 350, intravenous COMPARISON:  05/09/2010 FINDINGS: Cardiovascular: Satisfactory opacification of the  pulmonary arteries to the segmental level. No evidence of pulmonary embolism. Normal heart size. No pericardial effusion. Mild scattered atherosclerotic calcification of the thoracic aorta. Mediastinum/Nodes: No enlarged mediastinal, hilar, or axillary lymph nodes. Thyroid gland, trachea, and esophagus demonstrate no significant findings. Lungs/Pleura: Moderate upper lobe predominant centrilobular emphysema. Mild diffuse bronchial wall thickening. No suspicious pulmonary nodules. No focal consolidations, pleural effusion, or pneumothorax. Upper Abdomen: Please refer to the dedicated CT abdomen pelvis from the same day for intra abdominal findings. Musculoskeletal: No chest wall abnormality. No acute or significant osseous findings. Review of the MIP images confirms the above findings. IMPRESSION: Vascular: 1. No evidence of pulmonary embolism. 2.  Aortic Atherosclerosis (ICD10-I70.0). Non-Vascular: 1. No acute intrathoracic abnormality. 2. Chronic smoking related changes including emphysema (ICD10-J43.9) and bronchial wall thickening. Ruthann Cancer, MD [facility] Radiology Electronically Signed   By: Ruthann Cancer M.D.   On: 08/16/2021 12:41   CT ABDOMEN PELVIS W CONTRAST  Result Date: 08/16/2021 CLINICAL DATA:  Abdominal pain, acute, nonlocalized EXAM: CT ABDOMEN AND PELVIS WITH CONTRAST TECHNIQUE: Multidetector CT imaging of the abdomen and pelvis was performed using the standard protocol following bolus administration of intravenous contrast. CONTRAST:  140mL OMNIPAQUE IOHEXOL 350 MG/ML SOLN COMPARISON:  2014 FINDINGS: Lower chest: Dictated separately. Hepatobiliary: Perifissural focal fat. Liver is otherwise unremarkable. Gallbladder is unremarkable. No biliary dilatation. Pancreas: Unremarkable. Spleen: Unremarkable. Adrenals/Urinary Tract: Heterogeneous right adrenal lesion has been present on multiple remote studies but has slightly increased in size. Left  adrenal is unremarkable. Kidneys are unremarkable. The bladder is poorly distended. Stomach/Bowel: Stomach is within normal limits. Bowel is normal in caliber. Normal appendix. Fluid-filled colon with air-fluid levels. Vascular/Lymphatic: Atherosclerosis.  No enlarged nodes. Reproductive: No pelvic mass. Other: No free fluid.  Abdominal wall is unremarkable. Musculoskeletal: Degenerative disc disease at L5-S1. IMPRESSION: Fluid-filled colon with air-fluid levels consistent with reported history of diarrhea. Slight increase in size of heterogeneous right adrenal lesion. Given presence on multiple remote studies probably represents slow growth of an adenoma. Aortic atherosclerosis. Electronically Signed   By: Macy Mis M.D.   On: 08/16/2021 12:50    Microbiology: Results for orders placed or performed during the hospital encounter of 08/16/21  Resp Panel by RT-PCR (Flu A&B, Covid) Nasopharyngeal Swab     Status: Abnormal   Collection Time: 08/16/21 10:46 AM   Specimen: Nasopharyngeal Swab; Nasopharyngeal(NP) swabs in vial transport medium  Result Value Ref Range Status   SARS Coronavirus 2 by RT PCR NEGATIVE NEGATIVE Final    Comment: (NOTE) SARS-CoV-2 target nucleic acids are NOT DETECTED.  The SARS-CoV-2 RNA is generally detectable in upper respiratory specimens during the acute phase of infection. The lowest concentration of SARS-CoV-2 viral copies this assay can detect is 138 copies/mL. A negative result does not preclude SARS-Cov-2 infection and should not be used as the sole basis for treatment or other patient management decisions. A negative result may occur with  improper specimen collection/handling, submission of specimen other than nasopharyngeal swab, presence of viral mutation(s) within the areas targeted by this assay, and inadequate number of viral copies(<138 copies/mL). A negative result must be combined with clinical observations, patient history, and  epidemiological information. The expected result is Negative.  Fact Sheet for Patients:  EntrepreneurPulse.com.au  Fact Sheet for Healthcare Providers:  IncredibleEmployment.be  This test is no t yet approved or cleared by the [place] FDA and  has been authorized for detection and/or diagnosis of SARS-CoV-2 by FDA under an Emergency Use Authorization (EUA). This EUA will remain  in effect (meaning this test can be used) for the duration of the COVID-19 declaration under Section 564(b)(1) of the Act, 21 U.S.C.section 360bbb-3(b)(1), unless the authorization is terminated  or revoked sooner.       Influenza A by PCR POSITIVE (A) NEGATIVE Final   Influenza B by PCR NEGATIVE NEGATIVE Final    Comment: (NOTE) The Xpert Xpress SARS-CoV-2/FLU/RSV plus assay is intended as an aid in the diagnosis of influenza from Nasopharyngeal swab specimens and should not be used as a sole basis for treatment. Nasal washings and aspirates are unacceptable for Xpert Xpress SARS-CoV-2/FLU/RSV testing.  Fact Sheet for Patients: EntrepreneurPulse.com.au  Fact Sheet for Healthcare Providers: IncredibleEmployment.be  This test is not yet approved or cleared by the [place] FDA and has been authorized for detection and/or diagnosis of SARS-CoV-2 by FDA under an Emergency Use Authorization (EUA). This EUA will remain in effect (meaning this test can be used) for the duration of the COVID-19 declaration under Section 564(b)(1) of the Act, 21 U.S.C. section 360bbb-3(b)(1), unless the authorization is terminated or revoked.  Performed at [facility], [address], [place], [place] [address]   Blood Culture (routine x 2)  Status: None (Preliminary result)   Collection Time: 08/16/21 11:19 AM   Specimen: BLOOD  Result Value Ref Range Status   Specimen Description BLOOD LEFT ANTECUBITAL  Final   Special  Requests   Final    BOTTLES DRAWN AEROBIC AND ANAEROBIC Blood Culture adequate volume   Culture   Final    NO GROWTH 2 DAYS Performed at [facility], [address] [place], [place] [address]    Report Status PENDING  Incomplete  Blood Culture (routine x 2)     Status: None (Preliminary result)   Collection Time: 08/16/21 11:24 AM   Specimen: BLOOD  Result Value Ref Range Status   Specimen Description BLOOD BLOOD RIGHT HAND  Final   Special Requests   Final    BOTTLES DRAWN AEROBIC AND ANAEROBIC Blood Culture adequate volume   Culture   Final    NO GROWTH 2 DAYS Performed at [facility], [address] [place], [place] [address]    Report Status PENDING  Incomplete    Labs: CBC: Recent Labs  Lab 08/17/21 0500 08/17/21 0753 08/17/21 1503 08/17/21 2246 08/18/21 0643  WBC 9.7 10.4 8.9 6.2 5.6  HGB 10.9* 11.5* 11.2* 10.2* 10.3*  HCT 32.3* 32.7* 32.4* 29.9* 29.6*  MCV 93.6 93.2 91.8 93.7 92.8  PLT 128* 125* 131* 116* 741*   Basic Metabolic Panel: Recent Labs  Lab 08/16/21 1046 08/16/21 1104 08/17/21 0500  NA 132*  --  135  K 3.5  --  3.1*  CL 91*  --  100  CO2 27  --  31  GLUCOSE 282*  --  135*  BUN 36*  --  23*  CREATININE 1.12*  --  0.64  CALCIUM 9.0  --  8.1*  MG  --  1.8  --    Liver Function Tests: Recent Labs  Lab 08/16/21 1150  AST 33  ALT 32  ALKPHOS 45  BILITOT 1.4*  PROT 6.6  ALBUMIN 4.0   CBG: Recent Labs  Lab 08/17/21 0738 08/17/21 1221 08/17/21 1624 08/17/21 2106 08/18/21 0735  GLUCAP 129* 119* 114* 123* 111*    Discharge time spent: less than 30 minutes.  Signed:  Edwin Dada MD.  [facility] 08/18/2021

## 2021-08-18 NOTE — TOC Transition Note (Signed)
Transition of Care [facility]) - CM/SW Discharge Note   Patient Details  Name: [name] MRN: [id number] Date of Birth: August 23, 1965  Transition of Care [facility] CM/SW Contact:  Izola Price, RN Phone Number: 08/18/2021, 10:53 AM   Clinical Narrative:  Patient has discharge orders for today home/self care.  Admitted 08/16/21. Positive for flu A. Simmie Davies RN CM     Final next level of care: Home/Self Care Barriers to Discharge: Barriers Resolved   Patient Goals and CMS Choice        Discharge Placement                       Discharge Plan and Services                DME Arranged: N/A DME Agency: NA       HH Arranged: NA HH Agency: NA        Social Determinants of Health (SDOH) Interventions     Readmission Risk Interventions No flowsheet data found.

## 2021-08-21 LAB — CULTURE, BLOOD (ROUTINE X 2)
Culture: NO GROWTH
Culture: NO GROWTH
Special Requests: ADEQUATE
Special Requests: ADEQUATE

## 2022-01-05 ENCOUNTER — Emergency Department: Payer: Self-pay

## 2022-01-05 ENCOUNTER — Encounter: Payer: Self-pay | Admitting: Emergency Medicine

## 2022-01-05 ENCOUNTER — Other Ambulatory Visit: Payer: Self-pay

## 2022-01-05 ENCOUNTER — Emergency Department
Admission: EM | Admit: 2022-01-05 | Discharge: 2022-01-05 | Disposition: A | Discharge status: Home / Self Care | Payer: Self-pay | Attending: Emergency Medicine | Admitting: Emergency Medicine

## 2022-01-05 DIAGNOSIS — Z20822 Contact with and (suspected) exposure to covid-19: Secondary | ICD-10-CM | POA: Insufficient documentation

## 2022-01-05 DIAGNOSIS — Z8544 Personal history of malignant neoplasm of other female genital organs: Secondary | ICD-10-CM | POA: Insufficient documentation

## 2022-01-05 DIAGNOSIS — J209 Acute bronchitis, unspecified: Secondary | ICD-10-CM | POA: Insufficient documentation

## 2022-01-05 DIAGNOSIS — B349 Viral infection, unspecified: Secondary | ICD-10-CM | POA: Insufficient documentation

## 2022-01-05 DIAGNOSIS — I10 Essential (primary) hypertension: Secondary | ICD-10-CM | POA: Insufficient documentation

## 2022-01-05 LAB — CBC
HCT: 46.7 % — ABNORMAL HIGH (ref 36.0–46.0)
Hemoglobin: 15.3 g/dL — ABNORMAL HIGH (ref 12.0–15.0)
MCH: 29.3 pg (ref 26.0–34.0)
MCHC: 32.8 g/dL (ref 30.0–36.0)
MCV: 89.5 fL (ref 80.0–100.0)
Platelets: 278 10*3/uL (ref 150–400)
RBC: 5.22 MIL/uL — ABNORMAL HIGH (ref 3.87–5.11)
RDW: 15.4 % (ref 11.5–15.5)
WBC: 13.5 10*3/uL — ABNORMAL HIGH (ref 4.0–10.5)
nRBC: 0 % (ref 0.0–0.2)

## 2022-01-05 LAB — BASIC METABOLIC PANEL
Anion gap: 13 (ref 5–15)
BUN: 31 mg/dL — ABNORMAL HIGH (ref 6–20)
CO2: 23 mmol/L (ref 22–32)
Calcium: 9.6 mg/dL (ref 8.9–10.3)
Chloride: 101 mmol/L (ref 98–111)
Creatinine, Ser: 0.86 mg/dL (ref 0.44–1.00)
GFR, Estimated: >60 mL/min (ref >60)
Glucose, Bld: 195 mg/dL — ABNORMAL HIGH (ref 70–99)
Potassium: 3.7 mmol/L (ref 3.5–5.1)
Sodium: 137 mmol/L (ref 135–145)

## 2022-01-05 LAB — URINALYSIS, ROUTINE W REFLEX MICROSCOPIC
Bacteria, UA: NONE SEEN
Bilirubin Urine: NEGATIVE
Glucose, UA: NEGATIVE mg/dL
Ketones, ur: 80 mg/dL — AB
Leukocytes,Ua: NEGATIVE
Nitrite: NEGATIVE
Protein, ur: 30 mg/dL — AB
Specific Gravity, Urine: 1.04 — ABNORMAL HIGH (ref 1.005–1.030)
pH: 5 (ref 5.0–8.0)

## 2022-01-05 LAB — T4, FREE: Free T4: 1.21 ng/dL — ABNORMAL HIGH (ref 0.61–1.12)

## 2022-01-05 LAB — RESP PANEL BY RT-PCR (FLU A&B, COVID) ARPGX2
Influenza A by PCR: NEGATIVE
Influenza B by PCR: NEGATIVE
SARS Coronavirus 2 by RT PCR: NEGATIVE

## 2022-01-05 LAB — TSH: TSH: 0.609 u[IU]/mL (ref 0.350–4.500)

## 2022-01-05 LAB — TROPONIN I (HIGH SENSITIVITY)
Troponin I (High Sensitivity): 7 ng/L (ref <18)
Troponin I (High Sensitivity): 9 ng/L (ref <18)

## 2022-01-05 LAB — LACTIC ACID, PLASMA: Lactic Acid, Venous: 1.3 mmol/L (ref 0.5–1.9)

## 2022-01-05 MED ORDER — METOCLOPRAMIDE HCL 5 MG/ML IJ SOLN
10.0000 mg | Freq: Once | INTRAMUSCULAR | Status: AC
  Administered 2022-01-05: 10 mg via INTRAVENOUS
  Filled 2022-01-05: qty 2

## 2022-01-05 MED ORDER — ALBUTEROL SULFATE (2.5 MG/3ML) 0.083% IN NEBU
2.5000 mg | INHALATION_SOLUTION | Freq: Once | RESPIRATORY_TRACT | Status: AC
Start: 2022-01-05 — End: 2022-01-05
  Administered 2022-01-05: 2.5 mg via RESPIRATORY_TRACT
  Filled 2022-01-05: qty 3

## 2022-01-05 MED ORDER — PSEUDOEPHEDRINE HCL 30 MG PO TABS: 60.0000 mg | ORAL_TABLET | Freq: Four times a day (QID) | ORAL | 2 refills | Status: AC | PRN

## 2022-01-05 MED ORDER — ALBUTEROL SULFATE HFA 108 (90 BASE) MCG/ACT IN AERS: 2.0000 | INHALATION_SPRAY | RESPIRATORY_TRACT | 0 refills | Status: AC | PRN

## 2022-01-05 MED ORDER — SODIUM CHLORIDE 0.9 % IV BOLUS
1000.0000 mL | Freq: Once | INTRAVENOUS | Status: AC
  Administered 2022-01-05: 1000 mL via INTRAVENOUS

## 2022-01-05 MED ORDER — PREDNISONE 20 MG PO TABS
60.0000 mg | ORAL_TABLET | Freq: Once | ORAL | Status: AC
  Administered 2022-01-05: 60 mg via ORAL
  Filled 2022-01-05: qty 3

## 2022-01-05 MED ORDER — IOHEXOL 350 MG/ML SOLN
75.0000 mL | Freq: Once | INTRAVENOUS | Status: AC | PRN
  Administered 2022-01-05: 75 mL via INTRAVENOUS

## 2022-01-05 MED ORDER — KETOROLAC TROMETHAMINE 30 MG/ML IJ SOLN
15.0000 mg | Freq: Once | INTRAMUSCULAR | Status: AC
  Administered 2022-01-05: 15 mg via INTRAVENOUS
  Filled 2022-01-05: qty 1

## 2022-01-05 MED ORDER — ACETAMINOPHEN 325 MG PO TABS: 650.0000 mg | ORAL_TABLET | Freq: Once | ORAL | Status: DC

## 2022-01-05 MED ORDER — ONDANSETRON HCL 4 MG/2ML IJ SOLN
4.0000 mg | Freq: Once | INTRAMUSCULAR | Status: AC
  Administered 2022-01-05: 4 mg via INTRAVENOUS
  Filled 2022-01-05: qty 2

## 2022-01-05 MED ORDER — PREDNISONE 20 MG PO TABS: 60.0000 mg | ORAL_TABLET | Freq: Every day | ORAL | 0 refills | Status: AC

## 2022-01-05 MED ORDER — MORPHINE SULFATE (PF) 4 MG/ML IV SOLN
4.0000 mg | Freq: Once | INTRAVENOUS | Status: AC
  Administered 2022-01-05: 4 mg via INTRAVENOUS
  Filled 2022-01-05: qty 1

## 2022-01-05 MED ORDER — ONDANSETRON 8 MG PO TBDP: 8.0000 mg | ORAL_TABLET | Freq: Three times a day (TID) | ORAL | 0 refills | Status: DC | PRN

## 2022-01-05 NOTE — ED Provider Notes (Signed)
3:47 PM Assumed care for off going team.   Blood pressure (!) 186/108, pulse (!) 111, temperature 98.2 F (36.8 C), resp. rate 18, height '5\' 6"'$  (1.676 m), weight 74.8 kg, SpO2 96 %.  See their HPI for full report but in brief pending re-eval   Patient still slightly tachycardic but still getting IV fluids.  She reports feeling a little bit better and hoping that she can go home.  Explained to patient my concern with her continued elevated heart rates.  She is got a soft and nontender abdomen and her CT had already ruled out pulmonary embolism with no evidence of ACS.  Patient was given some albuterol so this could also be contributing but it sounds like patient was tachycardic even prior to coming in.  We will add on thyroid studies to look for hypothyroidism  Patient heart rate is still slightly tachycardic to 106 and does go up with movement slightly but not as much as earlier and she would really like to go home.  She reports that she does not want to be hospitalized unless on a percent necessary.  She reports feeling a lot better and feels comfortable with discharge home.  I did get a repeat EKG.  Her thyroid test was slightly elevated.  Not suspect this is causing the tachycardia I suspect is more likely from her illness and she will follow-up with her primary care doctor.  Repeat EKG remains sinus tachycardia rate of 102 without any ST elevation or T wave inversions.  Patient feeling much better and requesting discharge        Vanessa Grant, MD 01/05/22 [phone]

## 2022-01-05 NOTE — Discharge Instructions (Addendum)
If you develop worsening symptoms please return to the ER for repeat evaluation or any other concerns

## 2022-01-05 NOTE — ED Triage Notes (Signed)
Pt via POV from home. Pt c/o nausea, lightheadedness, fever, headache, cough, and congestion. Pt also c/o L sided chest pain and SOB. Pt thinks it is a sinus infection. Pt also has been out of her medication for the couple months. Pt is hypertensive on arrival. Pt is A&OX4 and NAD

## 2022-01-05 NOTE — ED Notes (Signed)
Patient transported to CT 

## 2022-01-05 NOTE — ED Provider Notes (Signed)
[facility] Provider Note    Event Date/Time   First MD Initiated Contact with Patient 01/05/22 1133     (approximate)   History   Dizziness, Chest Pain, and Shortness of Breath   HPI  [name] is a 56 y.o. female with a history of hypertension, GERD, depression, pancreatitis, and vulvar cancer who presents with multiple symptoms over the last few days, primarily nausea and vomiting, chest pain, shortness of breath, headache, and nasal congestion.  The patient states that the nausea started first and then she developed the respiratory symptoms and then the chest pain.  The pain is sharp and right in the middle of her chest between her breasts.  She denies any abdominal pain or diarrhea.  She denies any sick contacts.    Physical Exam   Triage Vital Signs: ED Triage Vitals  Enc Vitals Group     BP 01/05/22 1002 (!) 183/108     Pulse Rate 01/05/22 1002 (!) 109     Resp 01/05/22 1002 18     Temp 01/05/22 1002 98.2 F (36.8 C)     Temp src --      SpO2 01/05/22 1002 96 %     Weight 01/05/22 1004 165 lb (74.8 kg)     Height 01/05/22 1004 '5\' 6"'$  (1.676 m)     Head Circumference --      Peak Flow --      Pain Score 01/05/22 1002 8     Pain Loc --      Pain Edu? --      Excl. in [place]? --     Most recent vital signs: Vitals:   01/05/22 1400 01/05/22 1500  BP: (!) 191/106 (!) 186/108  Pulse: 98 (!) 111  Resp: 20 18  Temp:    SpO2: 97% 96%     General: Alert, uncomfortable appearing but in no distress.  CV:  Good peripheral perfusion.  Tachycardic, regular rhythm.  Normal heart sounds. Resp:  Slightly increased effort.  Coarse breath sounds bilaterally with no wheezes or rales. Abd:  Soft and nontender.  No distention.  Other:  No peripheral edema.   ED Results / Procedures / Treatments   Labs (all labs ordered are listed, but only abnormal results are displayed) Labs Reviewed  BASIC METABOLIC PANEL - Abnormal; Notable for the  following components:      Result Value   Glucose, Bld 195 (*)    BUN 31 (*)    All other components within normal limits  CBC - Abnormal; Notable for the following components:   WBC 13.5 (*)    RBC 5.22 (*)    Hemoglobin 15.3 (*)    HCT 46.7 (*)    All other components within normal limits  RESP PANEL BY RT-PCR (FLU A&B, COVID) [id number]  LACTIC ACID, PLASMA  TROPONIN I (HIGH SENSITIVITY)  TROPONIN I (HIGH SENSITIVITY)     EKG  ED ECG REPORT I, Arta Silence, the attending physician, personally viewed and interpreted this ECG.  Date: 01/05/2022 EKG Time: 1005 Rate: 95 Rhythm: normal sinus rhythm QRS Axis: normal Intervals: normal ST/T Wave abnormalities: Nonspecific abnormalities Narrative Interpretation: Nonspecific abnormalities with no evidence of acute ischemia    RADIOLOGY  Chest x-ray: I independently viewed and interpreted the images; there is no focal consolidation or edema  CT angio chest: IMPRESSION:  No evidence of pulmonary embolism or other active disease.     Small hiatal hernia.  Stable benign left adrenal adenoma.     Aortic Atherosclerosis (ICD10-I70.0).    PROCEDURES:  Critical Care performed: No  Procedures   MEDICATIONS ORDERED IN ED: Medications  ondansetron (ZOFRAN) injection 4 mg (has no administration in time range)  ketorolac (TORADOL) 30 MG/ML injection 15 mg (has no administration in time range)  sodium chloride 0.9 % bolus 1,000 mL (0 mLs Intravenous Stopped 01/05/22 1335)  ondansetron (ZOFRAN) injection 4 mg (4 mg Intravenous Given 01/05/22 1236)  morphine (PF) 4 MG/ML injection 4 mg (4 mg Intravenous Given 01/05/22 1236)  iohexol (OMNIPAQUE) 350 MG/ML injection 75 mL (75 mLs Intravenous Contrast Given 01/05/22 1244)  sodium chloride 0.9 % bolus 1,000 mL (1,000 mLs Intravenous New Bag/Given 01/05/22 1430)  metoCLOPramide (REGLAN) injection 10 mg (10 mg Intravenous Given 01/05/22 1430)  predniSONE (DELTASONE) tablet 60 mg  (60 mg Oral Given 01/05/22 1430)  albuterol (PROVENTIL) (2.5 MG/3ML) 0.083% nebulizer solution 2.5 mg (2.5 mg Nebulization Given 01/05/22 1429)     IMPRESSION / MDM / ASSESSMENT AND PLAN / ED COURSE  I reviewed the triage vital signs and the nursing notes.  56 year old female with PMH as noted above presents with multiple complaints over the last several days including nausea and vomiting, URI symptoms, cough, and chest pain.  I reviewed the past medical records.  Per the discharge summary from 08/18/2021 the patient was admitted at that time for influenza with hematochezia.  On exam today, the patient is overall relatively well-appearing.  She is tachycardic and slightly hypertensive with a normal O2 saturation on room air.  Her tachycardia increases to the 130s-140s even with minimal exertion such as changing her position in bed.  Lungs are slightly coarse but clear.  Abdomen is nontender.  Exam is otherwise unremarkable.  Differential diagnosis includes, but is not limited to, acute bronchitis, pneumonia, COVID-19, influenza, PE, or less likely ACS.  I do not suspect aortic dissection given the lack of radiation to the back and the presence of the URI symptoms and nausea, as well as the lack of specific risk factors.  Chest x-ray is negative.  EKG is nonischemic.  We will obtain a CT angio of the chest, lab work-up, respiratory panel, cardiac enzymes, and reassess.  The patient is on the cardiac monitor to evaluate for evidence of arrhythmia and/or significant heart rate changes.  ----------------------------------------- 3:15 PM on 01/05/2022 -----------------------------------------  Work-up is overall reassuring.  CT angio was negative for PE or other acute findings.  CBC shows mild leukocytosis but the other labs are unremarkable.  Lactate is normal.  Troponins are negative x2.  Electrolytes are normal.  Respiratory panel is negative.  On reassessment the patient appeared more  comfortable.  She is still somewhat tachycardic especially when moving around.  Overall I suspect viral syndrome with bronchitis and GI symptoms.  We will give additional fluids, Reglan for headache, and reassess the patient.  If her tachycardia significantly improved and she is feeling well she likely will be able to go home.  I signed the patient out to the oncoming ED physician Dr. Jari Pigg.   FINAL CLINICAL IMPRESSION(S) / ED DIAGNOSES   Final diagnoses:  Acute bronchitis, unspecified organism  Viral syndrome     Rx / DC Orders   ED Discharge Orders          Ordered    predniSONE (DELTASONE) 20 MG tablet  Daily with breakfast        01/05/22 1436    pseudoephedrine (SUDAFED) 30 MG tablet  Every 6 hours PRN        01/05/22 1436    albuterol (VENTOLIN HFA) 108 (90 Base) MCG/ACT inhaler  Every 4 hours PRN        01/05/22 1436    ondansetron (ZOFRAN-ODT) 8 MG disintegrating tablet  Every 8 hours PRN        01/05/22 1436             Note:  This document was prepared using Dragon voice recognition software and may include unintentional dictation errors.    Arta Silence, MD 01/05/22 [phone]

## 2022-03-18 ENCOUNTER — Other Ambulatory Visit: Payer: Self-pay

## 2022-03-18 ENCOUNTER — Emergency Department
Admission: EM | Admit: 2022-03-18 | Discharge: 2022-03-18 | Disposition: A | Discharge status: Home / Self Care | Payer: Self-pay | Attending: Emergency Medicine | Admitting: Emergency Medicine

## 2022-03-18 ENCOUNTER — Encounter: Payer: Self-pay | Admitting: Emergency Medicine

## 2022-03-18 DIAGNOSIS — B86 Scabies: Secondary | ICD-10-CM | POA: Insufficient documentation

## 2022-03-18 DIAGNOSIS — Z207 Contact with and (suspected) exposure to pediculosis, acariasis and other infestations: Secondary | ICD-10-CM

## 2022-03-18 MED ORDER — CYPROHEPTADINE HCL 4 MG PO TABS
4.0000 mg | ORAL_TABLET | Freq: Three times a day (TID) | ORAL | 0 refills | Status: DC | PRN
  Filled 2022-03-18: qty 30, 10d supply, fill #0

## 2022-03-18 MED ORDER — PERMETHRIN 5 % EX CREA
TOPICAL_CREAM | CUTANEOUS | 0 refills | Status: AC
  Filled 2022-03-18: qty 60, 1d supply, fill #0

## 2022-03-18 MED ORDER — DEXAMETHASONE SODIUM PHOSPHATE 10 MG/ML IJ SOLN
10.0000 mg | Freq: Once | INTRAMUSCULAR | Status: AC
  Administered 2022-03-18: 10 mg via INTRAMUSCULAR
  Filled 2022-03-18: qty 1

## 2022-03-18 MED ORDER — FAMOTIDINE 20 MG PO TABS
20.0000 mg | ORAL_TABLET | Freq: Two times a day (BID) | ORAL | 0 refills | Status: DC
  Filled 2022-03-18: qty 60, 30d supply, fill #0

## 2022-03-18 MED ORDER — HYDROXYZINE PAMOATE 25 MG PO CAPS
25.0000 mg | ORAL_CAPSULE | Freq: Three times a day (TID) | ORAL | 0 refills | Status: AC | PRN
  Filled 2022-03-18: qty 30, 10d supply, fill #0

## 2022-03-18 MED ORDER — FAMOTIDINE 20 MG PO TABS
40.0000 mg | ORAL_TABLET | Freq: Once | ORAL | Status: AC
  Administered 2022-03-18: 40 mg via ORAL
  Filled 2022-03-18: qty 2

## 2022-03-18 MED ORDER — METOPROLOL SUCCINATE ER 100 MG PO TB24
100.0000 mg | ORAL_TABLET | Freq: Every day | ORAL | 2 refills | Status: AC
  Filled 2022-03-18: qty 30, 30d supply, fill #0

## 2022-03-18 MED ORDER — TRIAMCINOLONE ACETONIDE 0.5 % EX OINT
1.0000 | TOPICAL_OINTMENT | Freq: Two times a day (BID) | CUTANEOUS | 0 refills | Status: AC
  Filled 2022-03-18: qty 30, 15d supply, fill #0

## 2022-03-18 NOTE — Discharge Instructions (Signed)
You must apply the cream over your entire body area from the neck down to the feet, for it to be effective.  Leave the lotion in place to dry for 24 hours before you rinse it off.  You can repeat the regimen in 14 days if no improvement.

## 2022-03-18 NOTE — ED Triage Notes (Signed)
Rash for about a month. All over

## 2022-03-18 NOTE — ED Provider Notes (Signed)
[facility] Emergency Department Provider Note     Event Date/Time   First MD Initiated Contact with Patient 03/18/22 1158     (approximate)   History   Allergic Reaction   HPI  [name] is a 56 y.o. female presents was up to the ED for evaluation of a persistent pruritic rash.  Patient reports generalized rash, has been present for better than a month.  She was evaluated at a facility at [facility], and diagnosed with scabies.  She was discharged with a prescription for permethrin from lotion as well as hydroxyzine.  Patient reports applying the lotion to the affected areas alone, rinsing off the following morning, and reapplying the lotion to the affected areas again.  She presents today noting no benefit overall and continued intermittent rash.  He denies any fevers, chills, sweats patient denies any sick contacts.     Physical Exam   Triage Vital Signs: ED Triage Vitals  Enc Vitals Group     BP 03/18/22 1140 (!) 153/103     Pulse Rate 03/18/22 1140 94     Resp 03/18/22 1140 17     Temp 03/18/22 1140 98.2 F (36.8 C)     Temp Source 03/18/22 1140 Oral     SpO2 03/18/22 1140 97 %     Weight 03/18/22 1157 164 lb 14.5 oz (74.8 kg)     Height 03/18/22 1157 '5\' 6"'$  (1.676 m)     Head Circumference --      Peak Flow --      Pain Score 03/18/22 1043 0     Pain Loc --      Pain Edu? --      Excl. in [place]? --     Most recent vital signs: Vitals:   03/18/22 1140  BP: (!) 153/103  Pulse: 94  Resp: 17  Temp: 98.2 F (36.8 C)  SpO2: 97%    General Awake, no distress. NAD HEENT NCAT. PERRL. EOMI. No rhinorrhea. Mucous membranes are moist.  CV:  Good peripheral perfusion.  RESP:  Normal effort.  ABD:  No distention.  SKIN:  Patient with generalized pruritic rash to the trunk and extremities.  Patient has some follicular lesions with surrounding erythema and induration but no purulence or pustules appreciated.  Ulcerations are noted  generally.  ED Results / Procedures / Treatments   Labs (all labs ordered are listed, but only abnormal results are displayed) Labs Reviewed - No data to display   EKG   RADIOLOGY   No results found.   PROCEDURES:  Critical Care performed: No  Procedures   MEDICATIONS ORDERED IN ED: Medications  dexamethasone (DECADRON) injection 10 mg (10 mg Intramuscular Given 03/18/22 1335)  famotidine (PEPCID) tablet 40 mg (40 mg Oral Given 03/18/22 1335)     IMPRESSION / MDM / ASSESSMENT AND PLAN / ED COURSE  I reviewed the triage vital signs and the nursing notes.                              Differential diagnosis includes, but is not limited to, contact dermatitis, eczema exacerbation, atopic dermatitis, bug bites, scabies, viral exanthem  Patient's presentation is most consistent with acute, uncomplicated illness.  Patient's diagnosis is consistent with scabies.  She probably has not treated the infestation appropriately as she only applied the lotion to the exposed or involved areas initially.  Patient will be discharged home  with prescriptions for permethrin, triam ointment, hydroxyzine, Pepcid, and a courtesy refill of her metoprolol XL. Patient is to follow up with Princella Ion as needed or otherwise directed. Patient is given ED precautions to return to the ED for any worsening or new symptoms.     FINAL CLINICAL IMPRESSION(S) / ED DIAGNOSES   Final diagnoses:  Scabies exposure     Rx / DC Orders   ED Discharge Orders          Ordered    permethrin (ELIMITE) 5 % cream        03/18/22 1315    metoprolol succinate (TOPROL-XL) 100 MG 24 hr tablet  Daily        03/18/22 1315    triamcinolone ointment (KENALOG) 0.5 %  2 times daily        03/18/22 1319    famotidine (PEPCID) 20 MG tablet  2 times daily        03/18/22 1320    cyproheptadine (PERIACTIN) 4 MG tablet  3 times daily PRN,   Status:  Discontinued        03/18/22 1320    hydrOXYzine (VISTARIL) 25 MG  capsule  3 times daily PRN        03/18/22 1343             Note:  This document was prepared using Dragon voice recognition software and may include unintentional dictation errors.    Melvenia Needles, PA-C 03/18/22 1950    Duffy Bruce, MD 03/19/22 [phone]

## 2022-03-18 NOTE — ED Notes (Signed)
Pt has rash at random areas of body; arms, legs, back. Red and itchy per pt. Denies pain at sites. Pt recently prescribed cream for it which she states made no difference. Pt denies any dietary changes, changes to detergent, lotions, perfumes. Pt in NAD.

## 2022-03-18 NOTE — ED Notes (Signed)
Pt states is supposed to be taking BP meds but cannot afford them.

## 2022-03-21 ENCOUNTER — Other Ambulatory Visit: Payer: Self-pay | Admitting: Pharmacy Technician

## 2022-03-21 ENCOUNTER — Other Ambulatory Visit: Payer: Self-pay

## 2022-03-21 NOTE — Patient Outreach (Signed)
Patient only signed [facility] Attestation.  Would need to provide current year's household income if PAP medications were needed.  Jacquelynn Cree Patient Advocate Specialist [place]

## 2022-06-02 ENCOUNTER — Other Ambulatory Visit: Payer: Self-pay

## 2022-06-02 ENCOUNTER — Emergency Department: Payer: Self-pay

## 2022-06-02 ENCOUNTER — Emergency Department
Admission: EM | Admit: 2022-06-02 | Discharge: 2022-06-02 | Disposition: A | Discharge status: Home / Self Care | Payer: Self-pay | Attending: Emergency Medicine | Admitting: Emergency Medicine

## 2022-06-02 DIAGNOSIS — F17200 Nicotine dependence, unspecified, uncomplicated: Secondary | ICD-10-CM | POA: Insufficient documentation

## 2022-06-02 DIAGNOSIS — M549 Dorsalgia, unspecified: Secondary | ICD-10-CM | POA: Insufficient documentation

## 2022-06-02 DIAGNOSIS — K2900 Acute gastritis without bleeding: Secondary | ICD-10-CM

## 2022-06-02 DIAGNOSIS — I10 Essential (primary) hypertension: Secondary | ICD-10-CM | POA: Insufficient documentation

## 2022-06-02 DIAGNOSIS — R112 Nausea with vomiting, unspecified: Secondary | ICD-10-CM | POA: Insufficient documentation

## 2022-06-02 DIAGNOSIS — R079 Chest pain, unspecified: Secondary | ICD-10-CM | POA: Insufficient documentation

## 2022-06-02 LAB — CBC
HCT: 50.7 % — ABNORMAL HIGH (ref 36.0–46.0)
Hemoglobin: 17.1 g/dL — ABNORMAL HIGH (ref 12.0–15.0)
MCH: 30 pg (ref 26.0–34.0)
MCHC: 33.7 g/dL (ref 30.0–36.0)
MCV: 88.9 fL (ref 80.0–100.0)
Platelets: 303 10*3/uL (ref 150–400)
RBC: 5.7 MIL/uL — ABNORMAL HIGH (ref 3.87–5.11)
RDW: 12.7 % (ref 11.5–15.5)
WBC: 13.8 10*3/uL — ABNORMAL HIGH (ref 4.0–10.5)
nRBC: 0 % (ref 0.0–0.2)

## 2022-06-02 LAB — URINALYSIS, ROUTINE W REFLEX MICROSCOPIC
Bacteria, UA: NONE SEEN
Bilirubin Urine: NEGATIVE
Glucose, UA: NEGATIVE mg/dL
Hgb urine dipstick: NEGATIVE
Ketones, ur: 80 mg/dL — AB
Leukocytes,Ua: NEGATIVE
Nitrite: NEGATIVE
Protein, ur: 30 mg/dL — AB
Specific Gravity, Urine: >1.046 — ABNORMAL HIGH (ref 1.005–1.030)
pH: 5 (ref 5.0–8.0)

## 2022-06-02 LAB — BASIC METABOLIC PANEL
Anion gap: 17 — ABNORMAL HIGH (ref 5–15)
BUN: 32 mg/dL — ABNORMAL HIGH (ref 6–20)
CO2: 18 mmol/L — ABNORMAL LOW (ref 22–32)
Calcium: 10.1 mg/dL (ref 8.9–10.3)
Chloride: 102 mmol/L (ref 98–111)
Creatinine, Ser: 1.34 mg/dL — ABNORMAL HIGH (ref 0.44–1.00)
GFR, Estimated: 47 mL/min — ABNORMAL LOW (ref >60)
Glucose, Bld: 175 mg/dL — ABNORMAL HIGH (ref 70–99)
Potassium: 4.6 mmol/L (ref 3.5–5.1)
Sodium: 137 mmol/L (ref 135–145)

## 2022-06-02 LAB — HEPATIC FUNCTION PANEL
ALT: 18 U/L (ref 0–44)
AST: 20 U/L (ref 15–41)
Albumin: 4.7 g/dL (ref 3.5–5.0)
Alkaline Phosphatase: 63 U/L (ref 38–126)
Bilirubin, Direct: 0.1 mg/dL (ref 0.0–0.2)
Indirect Bilirubin: 1.4 mg/dL — ABNORMAL HIGH (ref 0.3–0.9)
Total Bilirubin: 1.5 mg/dL — ABNORMAL HIGH (ref 0.3–1.2)
Total Protein: 7.5 g/dL (ref 6.5–8.1)

## 2022-06-02 LAB — TROPONIN I (HIGH SENSITIVITY)
Troponin I (High Sensitivity): 14 ng/L (ref <18)
Troponin I (High Sensitivity): 15 ng/L (ref <18)

## 2022-06-02 LAB — LIPASE, BLOOD: Lipase: 23 U/L (ref 11–51)

## 2022-06-02 LAB — CK: Total CK: 75 U/L (ref 38–234)

## 2022-06-02 MED ORDER — IOHEXOL 350 MG/ML SOLN: 100.0000 mL | Freq: Once | INTRAVENOUS | Status: DC | PRN

## 2022-06-02 MED ORDER — MORPHINE SULFATE (PF) 4 MG/ML IV SOLN
4.0000 mg | Freq: Once | INTRAVENOUS | Status: AC
  Administered 2022-06-02: 4 mg via INTRAVENOUS
  Filled 2022-06-02: qty 1

## 2022-06-02 MED ORDER — IOHEXOL 350 MG/ML SOLN
100.0000 mL | Freq: Once | INTRAVENOUS | Status: AC | PRN
  Administered 2022-06-02: 100 mL via INTRAVENOUS

## 2022-06-02 MED ORDER — ALUM & MAG HYDROXIDE-SIMETH 200-200-20 MG/5ML PO SUSP
30.0000 mL | Freq: Once | ORAL | Status: AC
  Administered 2022-06-02: 30 mL via ORAL
  Filled 2022-06-02: qty 30

## 2022-06-02 MED ORDER — SUCRALFATE 1 G PO TABS: 1.0000 g | ORAL_TABLET | Freq: Four times a day (QID) | ORAL | 11 refills | Status: AC

## 2022-06-02 MED ORDER — ONDANSETRON HCL 4 MG/2ML IJ SOLN
4.0000 mg | Freq: Once | INTRAMUSCULAR | Status: AC
  Administered 2022-06-02: 4 mg via INTRAVENOUS
  Filled 2022-06-02: qty 2

## 2022-06-02 MED ORDER — OMEPRAZOLE MAGNESIUM 20 MG PO TBEC: 40.0000 mg | DELAYED_RELEASE_TABLET | Freq: Every day | ORAL | 0 refills | Status: AC

## 2022-06-02 MED ORDER — ONDANSETRON 4 MG PO TBDP: 4.0000 mg | ORAL_TABLET | Freq: Three times a day (TID) | ORAL | 0 refills | Status: DC | PRN

## 2022-06-02 MED ORDER — SODIUM CHLORIDE 0.9 % IV BOLUS
1000.0000 mL | Freq: Once | INTRAVENOUS | Status: AC
  Administered 2022-06-02: 1000 mL via INTRAVENOUS

## 2022-06-02 NOTE — ED Triage Notes (Signed)
Pt arrives with c/o CP and emesis that started yesterday. Pt endorses SOB and cramping in her legs.

## 2022-06-02 NOTE — ED Provider Notes (Signed)
  Physical Exam  BP (!) 186/108 (BP Location: Right Arm)   Pulse (!) 108   Temp 98 F (36.7 C) (Oral)   Resp 19   Ht '5\' 6"'$  (1.676 m)   Wt 75.8 kg   SpO2 92%   BMI 26.95 kg/m   Physical Exam  Procedures  Procedures  ED Course / MDM   Clinical Course as of 06/02/22 1834  Mon Jun 02, 2022  1452 Patient reassessed, reports feeling improved after the morphine and Zofran [JP]    Clinical Course User Index [JP] Poggi, Clarnce Flock, PA-C   Medical Decision Making Amount and/or Complexity of Data Reviewed Labs: ordered. Radiology: ordered.  Risk OTC drugs. Prescription drug management.   This patient was transferred over to me from [facility], PA-C at approximately 1530.  Plan is to await the results of the CT angio chest/abdomen/pelvis to rule out dissection and reevaluate.  CT angio negative for aortic dissection or aneurysm, though does show evidence of gastritis versus peptic ulcer disease.  Given the patient's symptomatology and prior history of PUD, I suspect is likely the etiology of her symptoms.  She does state that she often takes aspirin and Goody powders frequently.  Advised her to discontinue this and use acetaminophen instead to help prevent worsening of her symptoms.  She was treated here with GI cocktail with significant improvement.  She is clinically stable at this time.  We will provide her with a prescription for omeprazole, sucralfate, and ondansetron, and have her follow-up with gastroenterology.  Patient was amenable to this plan.  Will discharge.          Teodoro Spray, [place] 06/02/22 Velta Addison    Blake Divine, MD 06/02/22 2330

## 2022-06-02 NOTE — ED Provider Notes (Signed)
[facility] Provider Note    Event Date/Time   First MD Initiated Contact with Patient 06/02/22 1324     (approximate)   History   Chest Pain and Emesis   HPI  [name] is a 56 y.o. female with a past medical history of major depressive disorder, hypertension, GERD, gastritis, current everyday smoker who presents today for evaluation of chest pain, back pain, and vomiting.  Patient reports that her symptoms began last night but worsened today.  She reports that she is vomiting mucus.  She denies any hemoptysis or hematemesis.  She denies abdominal pain.  She reports that she has substernal chest pain that radiates directly through to her shoulder blades.  She denies dizziness.  She has been taking aspirin for her symptoms.  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   Hematochezia 08/17/2021   Influenza A 08/16/2021   Nausea vomiting and diarrhea 08/16/2021   Depression with anxiety 08/16/2021   HTN (hypertension) 08/16/2021   GERD (gastroesophageal reflux disease) 08/16/2021   Elevated troponin 08/16/2021   Sepsis ([place]) 08/16/2021   Hyperglycemia 08/16/2021   Major depressive disorder, recurrent, severe with psychotic features ([place])    Acute gastritis 05/16/2015   Hypertensive urgency 05/13/2015   Facial droop 05/13/2015   Tobacco use disorder 05/10/2015   Cannabis use disorder, moderate, dependence ([place]) 05/10/2015   Major depressive disorder, recurrent severe without psychotic features ([place]) 75/64/3329   Complicated grief 51/88/4166          Physical Exam   Triage Vital Signs: ED Triage Vitals  Enc Vitals Group     BP 06/02/22 1113 (!) 177/125     Pulse Rate 06/02/22 1113 (!) 126     Resp 06/02/22 1113 (!) 22     Temp 06/02/22 1113 98.2 F (36.8 C)     Temp Source 06/02/22 1113 Oral     SpO2 06/02/22 1113 95 %     Weight 06/02/22 1110 167 lb (75.8 kg)     Height 06/02/22 1110 '5\' 6"'$   (1.676 m)     Head Circumference --      Peak Flow --      Pain Score 06/02/22 1110 10     Pain Loc --      Pain Edu? --      Excl. in [place]? --     Most recent vital signs: Vitals:   06/02/22 1327 06/02/22 1525  BP: (!) 192/113 (!) 189/125  Pulse: 98 (!) 107  Resp: (!) 22 18  Temp: (!) 97.3 F (36.3 C) 98.6 F (37 C)  SpO2: 100% 91%    Physical Exam Vitals and nursing note reviewed.  Constitutional:      General: Awake and alert.  Appears to be uncomfortable, laying on her side with her knees drawn up to her chest.    Appearance: Normal appearance. The patient is normal weight.  HENT:     Head: Normocephalic and atraumatic.     Mouth: Mucous membranes are moist.  Eyes:     General: PERRL. Normal EOMs        Right eye: No discharge.        Left eye: No discharge.     Conjunctiva/sclera: Conjunctivae normal.  Cardiovascular:     Rate and Rhythm: Tachycardic rate and regular rhythm.     Pulses: Normal 2+ radial and pedal pulses    Heart sounds: Normal heart sounds Pulmonary:  Effort: Pulmonary effort is normal. No respiratory distress.     Breath sounds: Normal breath sounds.  Abdominal:     Abdomen is soft. There is no abdominal tenderness. No rebound or guarding. No distention. Musculoskeletal:        General: No swelling. Normal range of motion.     Cervical back: Normal range of motion and neck supple.  No lower extremity swelling or tenderness Skin:    General: Skin is warm and dry.     Capillary Refill: Capillary refill takes less than 2 seconds.     Findings: No rash.  Neurological:     Mental Status: The patient is awake and alert.      ED Results / Procedures / Treatments   Labs (all labs ordered are listed, but only abnormal results are displayed) Labs Reviewed  BASIC METABOLIC PANEL - Abnormal; Notable for the following components:      Result Value   CO2 18 (*)    Glucose, Bld 175 (*)    BUN 32 (*)    Creatinine, Ser 1.34 (*)    GFR,  Estimated 47 (*)    Anion gap 17 (*)    All other components within normal limits  CBC - Abnormal; Notable for the following components:   WBC 13.8 (*)    RBC 5.70 (*)    Hemoglobin 17.1 (*)    HCT 50.7 (*)    All other components within normal limits  HEPATIC FUNCTION PANEL - Abnormal; Notable for the following components:   Total Bilirubin 1.5 (*)    Indirect Bilirubin 1.4 (*)    All other components within normal limits  LIPASE, BLOOD  CK  URINALYSIS, ROUTINE W REFLEX MICROSCOPIC  SALICYLATE LEVEL  POC URINE PREG, ED  TROPONIN I (HIGH SENSITIVITY)  TROPONIN I (HIGH SENSITIVITY)     EKG     RADIOLOGY I independently reviewed and interpreted imaging and agree with radiologists findings.     PROCEDURES:  Critical Care performed:   Procedures   MEDICATIONS ORDERED IN ED: Medications  sodium chloride 0.9 % bolus 1,000 mL (1,000 mLs Intravenous New Bag/Given 06/02/22 1417)  morphine (PF) 4 MG/ML injection 4 mg (4 mg Intravenous Given 06/02/22 1415)  ondansetron (ZOFRAN) injection 4 mg (4 mg Intravenous Given 06/02/22 1415)     IMPRESSION / MDM / ASSESSMENT AND PLAN / ED COURSE  I reviewed the triage vital signs and the nursing notes.   Differential diagnosis includes, but is not limited to, acute coronary syndrome, aortic dissection, pulmonary embolism, acute gastritis, upper GI bleed.  Patient presents emergency department, tachycardic, hypertensive, but with a normal oxygen saturation and is afebrile.  She appears to be quite uncomfortable.  EKG obtained in triage demonstrates no acute ST changes.  Labs reveal troponin to 14, and she also has an AKI to 1.3 from a baseline of 0.8.  She admits to not eating or drinking given her nausea and vomiting.  Chest x-ray without evidence of free air or cardiopulmonary abnormality.  Also has a leukocytosis to 13.8, nonspecific.  She does have a elevated H&H as well, suggestive of hemoconcentration, and she was hydrated with 1  L of normal saline.  She does have an increased anion gap.  Salicylate acid level was also obtained given that she admits to taking aspirin and has an elevated anion gap. Patient agrees with CTA for evaluation of dissection/PE given that she has had chest pain and back pain and has been persistently tachycardic.  Patient passed off to oncoming provider B. Simpson, PA-C pending CTA, urinalysis, and salicylates.   Patient's presentation is most consistent with acute presentation with potential threat to life or bodily function.   Clinical Course as of 06/02/22 1532  Mon Jun 02, 2022  1452 Patient reassessed, reports feeling improved after the morphine and Zofran [JP]    Clinical Course User Index [JP] Cayla Wiegand, Clarnce Flock, PA-C     FINAL CLINICAL IMPRESSION(S) / ED DIAGNOSES   Final diagnoses:  None     Rx / DC Orders   ED Discharge Orders     None        Note:  This document was prepared using Dragon voice recognition software and may include unintentional dictation errors.   Emeline Gins 06/02/22 1532    Harvest Dark, MD 06/03/22 2048

## 2022-06-02 NOTE — Discharge Instructions (Addendum)
-  Please take the omeprazole and sucralfate as prescribed.  You may additionally take ondansetron as needed for nausea.  -Please do not take any more ibuprofen, aspirin, or Goody powders.  This may worsen your symptoms.  -Follow-up with the gastroenterologist listed in these instructions.  You may call them tomorrow to schedule appointment.  Please let them know that you were seen here in the emergency department.  -Return to the emergency department anytime if you begin to experience any new or worsening symptoms.

## 2022-06-02 NOTE — ED Provider Triage Note (Signed)
Emergency Medicine Provider Triage Evaluation Note  [name] , a 56 y.o. female  was evaluated in triage.  Pt complains of nausea, vomiting, hypertension, chest pain that started last night. No known fever.  Physical Exam  Ht '5\' 6"'$  (1.676 m)   Wt 75.8 kg   BMI 26.95 kg/m  Gen:   Awake, no distress   Resp:  Normal effort  MSK:   Moves extremities without difficulty  Other:    Medical Decision Making  Medically screening exam initiated at 11:12 AM.  Appropriate orders placed.  [name] was informed that the remainder of the evaluation will be completed by another provider, this initial triage assessment does not replace that evaluation, and the importance of remaining in the ED until their evaluation is complete.   Victorino Dike, FNP 06/02/22 1114

## 2022-06-02 NOTE — ED Notes (Signed)
Taken BP again , first initial BP was high  new BP is 202/119

## 2022-10-30 ENCOUNTER — Emergency Department: Payer: 59

## 2022-10-30 ENCOUNTER — Emergency Department
Admission: EM | Admit: 2022-10-30 | Discharge: 2022-10-31 | Disposition: A | Discharge status: Home / Self Care | Payer: 59 | Attending: Emergency Medicine | Admitting: Emergency Medicine

## 2022-10-30 ENCOUNTER — Other Ambulatory Visit: Payer: Self-pay

## 2022-10-30 DIAGNOSIS — I10 Essential (primary) hypertension: Secondary | ICD-10-CM | POA: Diagnosis not present

## 2022-10-30 DIAGNOSIS — J029 Acute pharyngitis, unspecified: Secondary | ICD-10-CM | POA: Diagnosis not present

## 2022-10-30 DIAGNOSIS — F17200 Nicotine dependence, unspecified, uncomplicated: Secondary | ICD-10-CM | POA: Diagnosis not present

## 2022-10-30 DIAGNOSIS — Z8544 Personal history of malignant neoplasm of other female genital organs: Secondary | ICD-10-CM | POA: Diagnosis not present

## 2022-10-30 DIAGNOSIS — B349 Viral infection, unspecified: Secondary | ICD-10-CM

## 2022-10-30 DIAGNOSIS — R0602 Shortness of breath: Secondary | ICD-10-CM | POA: Diagnosis not present

## 2022-10-30 DIAGNOSIS — R059 Cough, unspecified: Secondary | ICD-10-CM | POA: Diagnosis not present

## 2022-10-30 DIAGNOSIS — Z20822 Contact with and (suspected) exposure to covid-19: Secondary | ICD-10-CM | POA: Diagnosis not present

## 2022-10-30 LAB — GROUP A STREP BY PCR: Group A Strep by PCR: NOT DETECTED

## 2022-10-30 MED ORDER — ACETAMINOPHEN 500 MG PO TABS
1000.0000 mg | ORAL_TABLET | Freq: Once | ORAL | Status: DC
  Filled 2022-10-30: qty 2

## 2022-10-30 MED ORDER — LIDOCAINE VISCOUS HCL 2 % MT SOLN
15.0000 mL | Freq: Once | OROMUCOSAL | Status: AC
  Administered 2022-10-30: 15 mL via OROMUCOSAL
  Filled 2022-10-30: qty 15

## 2022-10-30 MED ORDER — LACTATED RINGERS IV BOLUS
1000.0000 mL | Freq: Once | INTRAVENOUS | Status: AC
  Administered 2022-10-30: 1000 mL via INTRAVENOUS

## 2022-10-30 NOTE — ED Triage Notes (Signed)
Pt reports sinus infection and sore throat. Unable to get in with pcp until April. No relief with OTC meds at home. Pt ambulatory to triage. Breathing unlabored speaking in full sentences. Pt alert and oriented.

## 2022-10-30 NOTE — ED Provider Notes (Signed)
[facility] Provider Note    Event Date/Time   First MD Initiated Contact with Patient 10/30/22 2254     (approximate)   History   Sore Throat (/) and Facial Pain   HPI  [name] is a 57 y.o. female past medical history of depression, hypertension, GERD, gastritis who presents with nasal congestion and sore throat.  Patient symptoms are going on for about 3 days.  She endorses significant congestion with minimal nasal discharge as well as postnasal drip and sore throat.  She has had cough productive of yellow sputum no fevers or chills.  No nausea vomiting diarrhea or abdominal pain.  Still tolerating p.o.  She has been taking Mucinex at home no Sudafed.  She has not seen a primary care doctor in some time.  Thinks she supposed be taking blood pressure medication but is not taking anything currently.  Has an appointment in April.     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   Chronic headaches    Depression    Hernia    Hypertension    Pancreatitis, acute    Sepsis ([place]) 08/16/2021   Sinusitis    Wrist fracture     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   Hematochezia 08/17/2021   Influenza A 08/16/2021   Nausea vomiting and diarrhea 08/16/2021   Depression with anxiety 08/16/2021   HTN (hypertension) 08/16/2021   GERD (gastroesophageal reflux disease) 08/16/2021   Elevated troponin 08/16/2021   Sepsis ([place]) 08/16/2021   Hyperglycemia 08/16/2021   Major depressive disorder, recurrent, severe with psychotic features ([place])    Acute gastritis 05/16/2015   Hypertensive urgency 05/13/2015   Facial droop 05/13/2015   Tobacco use disorder 05/10/2015   Cannabis use disorder, moderate, dependence ([place]) 05/10/2015   Major depressive disorder, recurrent severe without psychotic features ([place]) [id number]   Complicated grief  [id number]     Physical Exam  Triage Vital Signs: ED Triage Vitals  Enc Vitals Group     BP 10/30/22 2247 (!) 216/130     Pulse Rate 10/30/22 2245 (!) 127     Resp 10/30/22 2245 18     Temp 10/30/22 2245 98.5 F (36.9 C)     Temp Source 10/30/22 2245 Oral     SpO2 10/30/22 2245 95 %     Weight 10/30/22 2244 165 lb (74.8 kg)     Height 10/30/22 2244 5\' 6"  (1.676 m)     Head Circumference --      Peak Flow --      Pain Score 10/30/22 2245 6     Pain Loc --      Pain Edu? --      Excl. in [place]? --     Most recent vital signs: Vitals:   10/30/22 2307 10/31/22 0130  BP: (!) 204/116 (!) 207/124  Pulse: (!) 108 98  Resp: 18 18  Temp:    SpO2: 94% 94%     General: Awake, no distress.  CV:  Good peripheral perfusion.  Tachycardic Resp:  Normal effort.  Lung sounds are clear no increased work of breathing Abd:  No distention.  Abdomen is soft and nondistended Neuro:             Awake, Alert, Oriented x 3  Other:  Posterior oropharynx is  mildly erythematous but uvula midline no exudate, patient is tolerating secretions    ED Results / Procedures / Treatments  Labs (all labs ordered are listed, but only abnormal results are displayed) Labs Reviewed  COMPREHENSIVE METABOLIC PANEL - Abnormal; Notable for the following components:      Result Value   Glucose, Bld 120 (*)    Calcium 8.4 (*)    Total Protein 6.3 (*)    All other components within normal limits  CBC WITH DIFFERENTIAL/PLATELET - Abnormal; Notable for the following components:   WBC 12.1 (*)    Neutro Abs 7.9 (*)    All other components within normal limits  GROUP A STREP BY PCR  RESP PANEL BY RT-PCR (RSV, FLU A&B, COVID)  [id number]  CBC WITH DIFFERENTIAL/PLATELET     EKG     RADIOLOGY I reviewed and interpreted the CXR which does not show any acute cardiopulmonary process    PROCEDURES:  Critical Care performed: No  .1-3 Lead EKG Interpretation  Performed by: Rada Hay, MD Authorized  by: Rada Hay, MD     Interpretation: abnormal     ECG rate assessment: tachycardic     Rhythm: sinus tachycardia     Ectopy: none     Conduction: normal     The patient is on the cardiac monitor to evaluate for evidence of arrhythmia and/or significant heart rate changes.   MEDICATIONS ORDERED IN ED: Medications  metoprolol succinate (TOPROL-XL) 24 hr tablet 100 mg (has no administration in time range)  lactated ringers bolus 1,000 mL (1,000 mLs Intravenous New Bag/Given 10/30/22 2340)  lidocaine (XYLOCAINE) 2 % viscous mouth solution 15 mL (15 mLs Mouth/Throat Given 10/30/22 2347)  dexamethasone (DECADRON) injection 10 mg (10 mg Intravenous Given 10/31/22 0128)  ketorolac (TORADOL) 30 MG/ML injection 15 mg (15 mg Intravenous Given 10/31/22 0128)     IMPRESSION / MDM / ASSESSMENT AND PLAN / ED COURSE  I reviewed the triage vital signs and the nursing notes.                              Patient's presentation is most consistent with acute complicated illness / injury requiring diagnostic workup.  Differential diagnosis includes, but is not limited to, viral illness including COVID-19, influenza, strep pharyngitis, sinus infection, medication side effect, medication noncompliance, pneumonia, less likely hypertensive emergency  Patient is a 57 year old female presents with 3 days of nasal congestion and sore throat.  She has had some productive cough mild dyspnea and has been feeling generally unwell.  She has not seen a doctor in some time and is not on any antihypertensives currently.  On arrival to the ED patient is quite hypertensive and tachycardic.  Overall she looks well she is tolerating secretions posterior oropharynx is mildly erythematous but there is no exudate uvula is midline exam is not consistent with PTA.  Lung sounds are clear.  Plan to obtain chest x-ray COVID and flu give bolus of fluid and check some basic labs given her hypertension.  CBC and CMP are largely  reassuring.  Strep test is negative.  COVID flu negative.  Chest x-ray clear.  Patient's heart rate did improve after some fluid.  She refused p.o. Tylenol because of throat pain.  She was given viscous lidocaine and then some IV Decadron and Toradol.  Patient was able to tolerate p.o. in the ED.  She remained hypertensive.  I see that she was  previously prescribed metoprolol succinate 100 mg which I have given her dose of in the ED and will refill the prescription.  She is asymptomatic from a hypertension standpoint.  Do not feel that she needs further workup for this in the ED.       FINAL CLINICAL IMPRESSION(S) / ED DIAGNOSES   Final diagnoses:  Uncontrolled hypertension  Sore throat  Viral illness     Rx / DC Orders   ED Discharge Orders          Ordered    metoprolol succinate (TOPROL XL) 100 MG 24 hr tablet  Daily        10/31/22 0158             Note:  This document was prepared using Dragon voice recognition software and may include unintentional dictation errors.   Rada Hay, MD 10/31/22 0200

## 2022-10-31 LAB — RESP PANEL BY RT-PCR (RSV, FLU A&B, COVID)  RVPGX2
Influenza A by PCR: NEGATIVE
Influenza B by PCR: NEGATIVE
Resp Syncytial Virus by PCR: NEGATIVE
SARS Coronavirus 2 by RT PCR: NEGATIVE

## 2022-10-31 LAB — COMPREHENSIVE METABOLIC PANEL
ALT: 16 U/L (ref 0–44)
AST: 26 U/L (ref 15–41)
Albumin: 4.1 g/dL (ref 3.5–5.0)
Alkaline Phosphatase: 72 U/L (ref 38–126)
Anion gap: 8 (ref 5–15)
BUN: 17 mg/dL (ref 6–20)
CO2: 22 mmol/L (ref 22–32)
Calcium: 8.4 mg/dL — ABNORMAL LOW (ref 8.9–10.3)
Chloride: 110 mmol/L (ref 98–111)
Creatinine, Ser: 0.68 mg/dL (ref 0.44–1.00)
GFR, Estimated: >60 mL/min (ref >60)
Glucose, Bld: 120 mg/dL — ABNORMAL HIGH (ref 70–99)
Potassium: 3.7 mmol/L (ref 3.5–5.1)
Sodium: 140 mmol/L (ref 135–145)
Total Bilirubin: 1.1 mg/dL (ref 0.3–1.2)
Total Protein: 6.3 g/dL — ABNORMAL LOW (ref 6.5–8.1)

## 2022-10-31 LAB — CBC WITH DIFFERENTIAL/PLATELET
Abs Immature Granulocytes: 0.07 10*3/uL (ref 0.00–0.07)
Basophils Absolute: 0.1 10*3/uL (ref 0.0–0.1)
Basophils Relative: 1 %
Eosinophils Absolute: 0.3 10*3/uL (ref 0.0–0.5)
Eosinophils Relative: 2 %
HCT: 43 % (ref 36.0–46.0)
Hemoglobin: 14.1 g/dL (ref 12.0–15.0)
Immature Granulocytes: 1 %
Lymphocytes Relative: 24 %
Lymphs Abs: 2.9 10*3/uL (ref 0.7–4.0)
MCH: 31.1 pg (ref 26.0–34.0)
MCHC: 32.8 g/dL (ref 30.0–36.0)
MCV: 94.7 fL (ref 80.0–100.0)
Monocytes Absolute: 0.9 10*3/uL (ref 0.1–1.0)
Monocytes Relative: 7 %
Neutro Abs: 7.9 10*3/uL — ABNORMAL HIGH (ref 1.7–7.7)
Neutrophils Relative %: 65 %
Platelets: 205 10*3/uL (ref 150–400)
RBC: 4.54 MIL/uL (ref 3.87–5.11)
RDW: 13 % (ref 11.5–15.5)
WBC: 12.1 10*3/uL — ABNORMAL HIGH (ref 4.0–10.5)
nRBC: 0 % (ref 0.0–0.2)

## 2022-10-31 MED ORDER — METOPROLOL SUCCINATE ER 50 MG PO TB24: 100.0000 mg | ORAL_TABLET | Freq: Every day | ORAL | Status: DC

## 2022-10-31 MED ORDER — DEXAMETHASONE SODIUM PHOSPHATE 10 MG/ML IJ SOLN
10.0000 mg | Freq: Once | INTRAMUSCULAR | Status: AC
  Administered 2022-10-31: 10 mg via INTRAVENOUS
  Filled 2022-10-31: qty 1

## 2022-10-31 MED ORDER — KETOROLAC TROMETHAMINE 30 MG/ML IJ SOLN
15.0000 mg | Freq: Once | INTRAMUSCULAR | Status: AC
  Administered 2022-10-31: 15 mg via INTRAVENOUS
  Filled 2022-10-31: qty 1

## 2022-10-31 MED ORDER — METOPROLOL SUCCINATE ER 100 MG PO TB24: 100.0000 mg | ORAL_TABLET | Freq: Every day | ORAL | 11 refills | Status: AC

## 2022-10-31 MED ORDER — METOPROLOL SUCCINATE ER 50 MG PO TB24
100.0000 mg | ORAL_TABLET | Freq: Once | ORAL | Status: AC
  Administered 2022-10-31: 100 mg via ORAL
  Filled 2022-10-31: qty 2

## 2022-10-31 NOTE — Discharge Instructions (Signed)
You likely have a virus causing your sore throat and nasal congestion.  Take Tylenol for pain and you can continue the Mucinex.  Please avoid any Sudafed or other decongestants because this can raise your blood pressure.  Blood pressure was quite elevated today.  Please start back on the metoprolol.  I have sent the prescription to your pharmacy.  It is important you follow-up with your primary doctor to ensure that your blood pressure is at goal.

## 2023-07-01 ENCOUNTER — Emergency Department: Payer: 59

## 2023-07-01 ENCOUNTER — Other Ambulatory Visit: Payer: Self-pay

## 2023-07-01 ENCOUNTER — Emergency Department
Admission: EM | Admit: 2023-07-01 | Discharge: 2023-07-01 | Disposition: A | Discharge status: Home / Self Care | Payer: 59 | Attending: Emergency Medicine | Admitting: Emergency Medicine

## 2023-07-01 ENCOUNTER — Encounter: Payer: Self-pay | Admitting: Emergency Medicine

## 2023-07-01 DIAGNOSIS — S6991XA Unspecified injury of right wrist, hand and finger(s), initial encounter: Secondary | ICD-10-CM | POA: Diagnosis present

## 2023-07-01 DIAGNOSIS — S60211A Contusion of right wrist, initial encounter: Secondary | ICD-10-CM | POA: Insufficient documentation

## 2023-07-01 DIAGNOSIS — I10 Essential (primary) hypertension: Secondary | ICD-10-CM | POA: Diagnosis not present

## 2023-07-01 DIAGNOSIS — W230XXA Caught, crushed, jammed, or pinched between moving objects, initial encounter: Secondary | ICD-10-CM | POA: Insufficient documentation

## 2023-07-01 DIAGNOSIS — S4990XA Unspecified injury of shoulder and upper arm, unspecified arm, initial encounter: Secondary | ICD-10-CM

## 2023-07-01 MED ORDER — OXYCODONE-ACETAMINOPHEN 5-325 MG PO TABS
1.0000 | ORAL_TABLET | Freq: Once | ORAL | Status: AC
  Administered 2023-07-01: 1 via ORAL
  Filled 2023-07-01: qty 1

## 2023-07-01 NOTE — ED Triage Notes (Signed)
Patient to ED via POV fro right arm injury. Patient states arm got stuck behind her couch while moving it. Bruising and swelling noted.

## 2023-07-01 NOTE — ED Provider Notes (Signed)
[facility] Provider Note    Event Date/Time   First MD Initiated Contact with Patient 07/01/23 1247     (approximate)   History   Arm Injury   HPI  [name] is a 57 y.o. female with history of chronic headaches, hypertension, Bell's palsy and a wrist fracture presents emergency department with concerns of right wrist fracture.  Patient was helping move a couch when it got caught in between the couch and the Cefobid part.  Has a large bruise.  States area is very sore.  Tried to use ice and did not help.  Tylenol and ibuprofen have not helped either.  No numbness or tingling.      Physical Exam   Triage Vital Signs: ED Triage Vitals  Encounter Vitals Group     BP 07/01/23 1238 (!) 153/48     Systolic BP Percentile --      Diastolic BP Percentile --      Pulse Rate 07/01/23 1238 83     Resp 07/01/23 1238 18     Temp 07/01/23 1238 98.5 F (36.9 C)     Temp Source 07/01/23 1238 Oral     SpO2 07/01/23 1238 95 %     Weight 07/01/23 1239 130 lb (59 kg)     Height 07/01/23 1239 5\' 6"  (1.676 m)     Head Circumference --      Peak Flow --      Pain Score 07/01/23 1239 5     Pain Loc --      Pain Education --      Exclude from Growth Chart --     Most recent vital signs: Vitals:   07/01/23 1238  BP: (!) 153/48  Pulse: 83  Resp: 18  Temp: 98.5 F (36.9 C)  SpO2: 95%     General: Awake, no distress.   CV:  Good peripheral perfusion. regular rate and  rhythm Resp:  Normal effort.  Abd:  No distention.   Other:  Right wrist with bruise noted below the distal radius, tender to palpation, decreased range of motion secondary discomfort, no deformity noted, neurovascular intact   ED Results / Procedures / Treatments   Labs (all labs ordered are listed, but only abnormal results are displayed) Labs Reviewed - No data to display   EKG     RADIOLOGY X-ray of the right wrist ordered from  triage    PROCEDURES:   Procedures   MEDICATIONS ORDERED IN ED: Medications  oxyCODONE-acetaminophen (PERCOCET/ROXICET) 5-325 MG per tablet 1 tablet (1 tablet Oral Given 07/01/23 1310)     IMPRESSION / MDM / ASSESSMENT AND PLAN / ED COURSE  I reviewed the triage vital signs and the nursing notes.                              Differential diagnosis includes, but is not limited to, contusion, hematoma, sprain, fracture  Patient's presentation is most consistent with acute complicated illness / injury requiring diagnostic workup.   X-ray of the right wrist is independently reviewed interpreted by me as being negative for any acute abnormality, confirmed by radiology  Patient was given Percocet while here in the ED.  Placed in a wrist brace.  She is to elevate and ice.  Reassurance given.  Follow-up with orthopedics if not improving in 1 week.  She is in agreement treatment plan.  Discharged stable condition.  FINAL CLINICAL IMPRESSION(S) / ED DIAGNOSES   Final diagnoses:  Injury of upper extremity, unspecified laterality, initial encounter     Rx / DC Orders   ED Discharge Orders     None        Note:  This document was prepared using Dragon voice recognition software and may include unintentional dictation errors.    Faythe Ghee, PA-C 07/01/23 1944    Sharyn Creamer, MD 07/02/23 [phone]

## 2023-09-17 ENCOUNTER — Emergency Department: Payer: 59

## 2023-09-17 ENCOUNTER — Encounter: Payer: Self-pay | Admitting: Emergency Medicine

## 2023-09-17 ENCOUNTER — Other Ambulatory Visit: Payer: Self-pay

## 2023-09-17 ENCOUNTER — Emergency Department
Admission: EM | Admit: 2023-09-17 | Discharge: 2023-09-17 | Disposition: A | Discharge status: Home / Self Care | Payer: 59 | Attending: Emergency Medicine | Admitting: Emergency Medicine

## 2023-09-17 DIAGNOSIS — Z20822 Contact with and (suspected) exposure to covid-19: Secondary | ICD-10-CM | POA: Diagnosis not present

## 2023-09-17 DIAGNOSIS — R112 Nausea with vomiting, unspecified: Secondary | ICD-10-CM | POA: Insufficient documentation

## 2023-09-17 DIAGNOSIS — E860 Dehydration: Secondary | ICD-10-CM | POA: Insufficient documentation

## 2023-09-17 LAB — RESP PANEL BY RT-PCR (RSV, FLU A&B, COVID)  RVPGX2
Influenza A by PCR: NEGATIVE
Influenza B by PCR: NEGATIVE
Resp Syncytial Virus by PCR: NEGATIVE
SARS Coronavirus 2 by RT PCR: NEGATIVE

## 2023-09-17 LAB — URINALYSIS, ROUTINE W REFLEX MICROSCOPIC
Bacteria, UA: NONE SEEN
Bilirubin Urine: NEGATIVE
Glucose, UA: NEGATIVE mg/dL
Hgb urine dipstick: NEGATIVE
Ketones, ur: 20 mg/dL — AB
Leukocytes,Ua: NEGATIVE
Nitrite: NEGATIVE
Protein, ur: NEGATIVE mg/dL
Specific Gravity, Urine: 1.025 (ref 1.005–1.030)
pH: 5 (ref 5.0–8.0)

## 2023-09-17 LAB — CBC
HCT: 44.1 % (ref 36.0–46.0)
Hemoglobin: 15 g/dL (ref 12.0–15.0)
MCH: 32.3 pg (ref 26.0–34.0)
MCHC: 34 g/dL (ref 30.0–36.0)
MCV: 94.8 fL (ref 80.0–100.0)
Platelets: 181 10*3/uL (ref 150–400)
RBC: 4.65 MIL/uL (ref 3.87–5.11)
RDW: 12.2 % (ref 11.5–15.5)
WBC: 16.9 10*3/uL — ABNORMAL HIGH (ref 4.0–10.5)
nRBC: 0 % (ref 0.0–0.2)

## 2023-09-17 LAB — COMPREHENSIVE METABOLIC PANEL
ALT: 20 U/L (ref 0–44)
AST: 21 U/L (ref 15–41)
Albumin: 4.6 g/dL (ref 3.5–5.0)
Alkaline Phosphatase: 39 U/L (ref 38–126)
Anion gap: 15 (ref 5–15)
BUN: 46 mg/dL — ABNORMAL HIGH (ref 6–20)
CO2: 21 mmol/L — ABNORMAL LOW (ref 22–32)
Calcium: 9.1 mg/dL (ref 8.9–10.3)
Chloride: 100 mmol/L (ref 98–111)
Creatinine, Ser: 0.86 mg/dL (ref 0.44–1.00)
GFR, Estimated: >60 mL/min (ref >60)
Glucose, Bld: 156 mg/dL — ABNORMAL HIGH (ref 70–99)
Potassium: 4.1 mmol/L (ref 3.5–5.1)
Sodium: 136 mmol/L (ref 135–145)
Total Bilirubin: 1.6 mg/dL — ABNORMAL HIGH (ref 0.0–1.2)
Total Protein: 7.4 g/dL (ref 6.5–8.1)

## 2023-09-17 LAB — LIPASE, BLOOD: Lipase: 40 U/L (ref 11–51)

## 2023-09-17 LAB — TROPONIN I (HIGH SENSITIVITY): Troponin I (High Sensitivity): 11 ng/L (ref <18)

## 2023-09-17 LAB — TSH: TSH: 0.436 u[IU]/mL (ref 0.350–4.500)

## 2023-09-17 MED ORDER — SODIUM CHLORIDE 0.9 % IV BOLUS: 1000.0000 mL | Freq: Once | INTRAVENOUS | Status: DC

## 2023-09-17 MED ORDER — ONDANSETRON HCL 4 MG/2ML IJ SOLN
4.0000 mg | Freq: Once | INTRAMUSCULAR | Status: AC
  Administered 2023-09-17: 4 mg via INTRAVENOUS
  Filled 2023-09-17: qty 2

## 2023-09-17 MED ORDER — ONDANSETRON 8 MG PO TBDP: 8.0000 mg | ORAL_TABLET | Freq: Three times a day (TID) | ORAL | 0 refills | Status: AC | PRN

## 2023-09-17 MED ORDER — SODIUM CHLORIDE 0.9 % IV BOLUS: 500.0000 mL | Freq: Once | INTRAVENOUS | Status: DC

## 2023-09-17 MED ORDER — IOHEXOL 300 MG/ML  SOLN
75.0000 mL | Freq: Once | INTRAMUSCULAR | Status: AC | PRN
  Administered 2023-09-17: 75 mL via INTRAVENOUS

## 2023-09-17 MED ORDER — LACTATED RINGERS IV BOLUS
1000.0000 mL | Freq: Once | INTRAVENOUS | Status: AC
  Administered 2023-09-17: 1000 mL via INTRAVENOUS

## 2023-09-17 NOTE — ED Notes (Signed)
Patient transported to hallway bathroom via wheelchair. Patient c/o dizziness, but was able to transfer independently. Patient is tolerating ginger ale well.

## 2023-09-17 NOTE — ED Triage Notes (Signed)
Pt to ED via POV c/o emesis for the past 2 days. Pt states that she is also having dizziness when she lays down and she feels like her heart is racing. Pt is tachycardic in triage in the 160's. Pt states that she feels very dehydrated. Pt denies cardiac history but states that her mom has a hx/o A. Fib. Pt states that she was having chills last night. Pt is in NAD.

## 2023-09-17 NOTE — ED Provider Notes (Signed)
[facility] Provider Note   Event Date/Time   First MD Initiated Contact with Patient 09/17/23 [phone]     (approximate) History  Emesis  HPI [name] is a 58 y.o. female who presents for nausea/vomiting over the last 2 days as well as palpitations and dizziness has been present since this a.m.  Patient was found to be tachycardic in triage to 160.  Patient also endorses chills and subjective fever last night.  Patient is not currently in any acute distress ROS: Patient currently denies any vision changes, tinnitus, difficulty speaking, facial droop, sore throat, chest pain, shortness of breath, abdominal pain, diarrhea, dysuria, or weakness/numbness/paresthesias in any extremity   Physical Exam  Triage Vital Signs: ED Triage Vitals  Encounter Vitals Group     BP 09/17/23 0840 (!) 137/101     Systolic BP Percentile --      Diastolic BP Percentile --      Pulse Rate 09/17/23 0840 (!) 161     Resp 09/17/23 0840 18     Temp 09/17/23 0845 98.8 F (37.1 C)     Temp Source 09/17/23 0845 Oral     SpO2 09/17/23 0840 97 %     Weight 09/17/23 0843 160 lb (72.6 kg)     Height 09/17/23 0843 5\' 6"  (1.676 m)     Head Circumference --      Peak Flow --      Pain Score 09/17/23 0843 8     Pain Loc --      Pain Education --      Exclude from Growth Chart --    Most recent vital signs: Vitals:   09/17/23 1345 09/17/23 1425  BP:  (!) 140/88  Pulse: (!) 102 84  Resp: 19 18  Temp:  98 F (36.7 C)  SpO2: 98% 98%   General: Awake, oriented x4. CV:  Good peripheral perfusion.  Resp:  Normal effort.  Abd:  No distention.  Other:  Middle-aged overweight Caucasian female resting comfortably in no acute distress ED Results / Procedures / Treatments  Labs (all labs ordered are listed, but only abnormal results are displayed) Labs Reviewed  COMPREHENSIVE METABOLIC PANEL - Abnormal; Notable for the following components:      Result Value   CO2 21 (*)    Glucose,  Bld 156 (*)    BUN 46 (*)    Total Bilirubin 1.6 (*)    All other components within normal limits  CBC - Abnormal; Notable for the following components:   WBC 16.9 (*)    All other components within normal limits  URINALYSIS, ROUTINE W REFLEX MICROSCOPIC - Abnormal; Notable for the following components:   Color, Urine YELLOW (*)    APPearance HAZY (*)    Ketones, ur 20 (*)    All other components within normal limits  RESP PANEL BY RT-PCR (RSV, FLU A&B, COVID)  RVPGX2  LIPASE, BLOOD  TSH  TROPONIN I (HIGH SENSITIVITY)   EKG ED ECG REPORT I, Merwyn Katos, the attending physician, personally viewed and interpreted this ECG. Date: 09/17/2023 EKG Time: 0842 Rate: 145 Rhythm: Tachycardic sinus rhythm QRS Axis: normal Intervals: normal ST/T Wave abnormalities: normal Narrative Interpretation: Tachycardic sinus rhythm.  No evidence of acute ischemia RADIOLOGY ED MD interpretation: One-view portable chest x-ray interpreted by me shows no evidence of acute abnormalities including no pneumonia, pneumothorax, or widened mediastinum.  There are incidentally found bilateral upper lung zone opacities that are unclear in origin  and recommended CT chest  CT of the chest with IV contrast interpreted independently and shows no discrete lung nodules without any lung masses, consolidation, pleural effusion, or pneumothorax -Agree with radiology assessment Official radiology report(s): CT Chest W Contrast Result Date: 09/17/2023 CLINICAL DATA:  Abnormal xray - lung nodule, >= 1 cm. EXAM: CT CHEST WITH CONTRAST TECHNIQUE: Multidetector CT imaging of the chest was performed during intravenous contrast administration. RADIATION DOSE REDUCTION: This exam was performed according to the departmental dose-optimization program which includes automated exposure control, adjustment of the mA and/or kV according to patient size and/or use of iterative reconstruction technique. CONTRAST:  75mL OMNIPAQUE IOHEXOL  300 MG/ML  SOLN COMPARISON:  CT scan chest from 06/02/2022. FINDINGS: Cardiovascular: Normal cardiac size. No pericardial effusion. No aortic aneurysm. There are coronary artery calcifications, in keeping with coronary artery disease. There are also mild peripheral atherosclerotic vascular calcifications of thoracic aorta and its major branches. Mediastinum/Nodes: Visualized thyroid gland appears grossly unremarkable. No solid / cystic mediastinal masses. The esophagus is nondistended precluding optimal assessment. There is mild circumferential thickening of the lower thoracic esophagus, which is most likely seen in the settings of chronic gastroesophageal reflux disease versus esophagitis. No axillary, mediastinal or hilar lymphadenopathy by size criteria. Lungs/Pleura: The central tracheo-bronchial tree is patent. No discrete nodule seen corresponding to the abnormality seen on the same-day performed chest radiograph. There are at least 4, stable, sub 4 mm, calcified and noncalcified nodules throughout bilateral lungs (marked with electronic arrow sign on series 100). No new or suspicious lung nodule. No mass or consolidation. No pleural effusion or pneumothorax. Upper Abdomen: Partially seen hypoattenuating area along the falciform ligament attachment site, favored to represent focal fatty infiltration. There is a 0.5 x 3.6 cm right adrenal nodule incompletely characterized on the current exam but present since multiple prior studies and favored benign in etiology. Remaining visualized upper abdominal viscera within normal limits. Musculoskeletal: The visualized soft tissues of the chest wall are grossly unremarkable. No suspicious osseous lesions. There are mild multilevel degenerative changes in the visualized spine. IMPRESSION: 1. No discrete lung nodule corresponding to the abnormality seen on the same-day performed chest radiograph. Findings on the chest radiograph were artifactual due to overlying objects.  2. No lung mass, consolidation, pleural effusion or pneumothorax. No new or suspicious lung nodule. 3. Multiple other nonacute observations, as described above. Aortic Atherosclerosis (ICD10-I70.0). Electronically Signed   By: Jules Schick M.D.   On: 09/17/2023 13:00   DG Chest Port 1 View Result Date: 09/17/2023 CLINICAL DATA:  Chest pain. EXAM: PORTABLE CHEST 1 VIEW COMPARISON:  10/30/2022. FINDINGS: There are 2 adjacent faint opacities overlying the right upper mid lung zones with larger opacity measuring up to 9 x 15 mm. These are incompletely characterized on the current examination but new since the prior study. Further evaluation with chest CT scan is recommended. Bilateral lung fields are otherwise clear. Bilateral costophrenic angles are clear. Normal cardio-mediastinal silhouette. No acute osseous abnormalities. The soft tissues are within normal limits. IMPRESSION: *There are 2 adjacent faint opacities overlying the right upper mid lung zones with larger opacity measuring up to 9 x 15 mm. These are incompletely characterized on this single projection radiograph. CT scan chest is recommended for further evaluation. Electronically Signed   By: Jules Schick M.D.   On: 09/17/2023 10:42   PROCEDURES: Critical Care performed: No Procedures MEDICATIONS ORDERED IN ED: Medications  sodium chloride 0.9 % bolus 1,000 mL (0 mLs Intravenous Stopped 09/17/23 1046)  ondansetron [facility]) injection 4 mg (4 mg Intravenous Given 09/17/23 0938)  lactated ringers bolus 1,000 mL (0 mLs Intravenous Stopped 09/17/23 1351)  iohexol (OMNIPAQUE) 300 MG/ML solution 75 mL (75 mLs Intravenous Contrast Given 09/17/23 1220)   IMPRESSION / MDM / ASSESSMENT AND PLAN / ED COURSE  I reviewed the triage vital signs and the nursing notes.                             The patient is on the cardiac monitor to evaluate for evidence of arrhythmia and/or significant heart rate changes. Patient's presentation is most consistent  with acute presentation with potential threat to life or bodily function. Patient presents for acute nausea/vomiting The cause of the patient's symptoms is not clear, but the patient is overall well appearing and is suspected to have a transient course of illness.  Given History and Exam there does not appear to be an emergent cause of the symptoms such as small bowel obstruction, coronary syndrome, bowel ischemia, DKA, pancreatitis, appendicitis, other acute abdomen or other emergent problem.  Reassessment: After treatment, the patient is feeling much better, tolerating PO fluids, and shows no signs of dehydration.   Disposition: Discharge home with prompt primary care physician follow up in the next 48 hours. Strict return precautions discussed.   FINAL CLINICAL IMPRESSION(S) / ED DIAGNOSES   Final diagnoses:  Nausea and vomiting, unspecified vomiting type  Dehydration   Rx / DC Orders   ED Discharge Orders          Ordered    ondansetron (ZOFRAN-ODT) 8 MG disintegrating tablet  Every 8 hours PRN        09/17/23 1414           Note:  This document was prepared using Dragon voice recognition software and may include unintentional dictation errors.   Merwyn Katos, MD 09/17/23 [phone]

## 2023-09-17 NOTE — ED Provider Triage Note (Signed)
Emergency Medicine Provider Triage Evaluation Note  [name] , a 58 y.o. female  was evaluated in triage.  Pt complains of vomiting for last 3 days. Now vomiting dark stuff.  No change in BM's to her knowledge.  Hx of PUD  Review of Systems  Positive: Vomiting, + dizziness Negative: No fever, flu sx  Physical Exam  BP (!) 137/101 (BP Location: Left Arm)   Pulse (!) 161   Temp 98.8 F (37.1 C) (Oral)   Resp 18   Ht 5\' 6"  (1.676 m)   Wt 72.6 kg   SpO2 97%   BMI 25.82 kg/m  Gen:   Awake, no distress   Resp:  Normal effort  MSK:   Moves extremities without difficulty  Other:    Medical Decision Making  Medically screening exam initiated at 8:50 AM.  Appropriate orders placed.  [name] was informed that the remainder of the evaluation will be completed by another provider, this initial triage assessment does not replace that evaluation, and the importance of remaining in the ED until their evaluation is complete.     Tommi Rumps, PA-C 09/17/23 [phone]

## 2023-09-17 NOTE — ED Notes (Signed)
Pt presents to ED in RM 45 not placed in cardiac monitor, pt tachycardiac between the 140-160's, pt placed on cardiac monitor at this time. Pt states N/V for the past 2 days, pt states pain on inspiration.  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 Pt has episodes of severe pain where she is not able to sit still. Pt is A&OX4. Pt endorses SOB, pt state she is congested.   Pt endorses chills but unsure of temp due to not having a thermometer.

## 2023-09-17 NOTE — ED Notes (Signed)
Patient still c/o  dizziness with movement and lying flat.

## 2024-07-10 ENCOUNTER — Emergency Department

## 2024-07-10 ENCOUNTER — Other Ambulatory Visit: Payer: Self-pay

## 2024-07-10 ENCOUNTER — Emergency Department
Admission: EM | Admit: 2024-07-10 | Discharge: 2024-07-10 | Disposition: A | Discharge status: Home / Self Care | Attending: Emergency Medicine | Admitting: Emergency Medicine

## 2024-07-10 DIAGNOSIS — Z720 Tobacco use: Secondary | ICD-10-CM | POA: Diagnosis not present

## 2024-07-10 DIAGNOSIS — I10 Essential (primary) hypertension: Secondary | ICD-10-CM | POA: Diagnosis not present

## 2024-07-10 DIAGNOSIS — R059 Cough, unspecified: Secondary | ICD-10-CM | POA: Diagnosis present

## 2024-07-10 DIAGNOSIS — J069 Acute upper respiratory infection, unspecified: Secondary | ICD-10-CM | POA: Insufficient documentation

## 2024-07-10 DIAGNOSIS — R112 Nausea with vomiting, unspecified: Secondary | ICD-10-CM | POA: Insufficient documentation

## 2024-07-10 LAB — COMPREHENSIVE METABOLIC PANEL WITH GFR
ALT: 13 U/L (ref 0–44)
AST: 14 U/L — ABNORMAL LOW (ref 15–41)
Albumin: 5.1 g/dL — ABNORMAL HIGH (ref 3.5–5.0)
Alkaline Phosphatase: 61 U/L (ref 38–126)
Anion gap: 14 (ref 5–15)
BUN: 26 mg/dL — ABNORMAL HIGH (ref 6–20)
CO2: 22 mmol/L (ref 22–32)
Calcium: 9.4 mg/dL (ref 8.9–10.3)
Chloride: 102 mmol/L (ref 98–111)
Creatinine, Ser: 0.73 mg/dL (ref 0.44–1.00)
GFR, Estimated: >60 mL/min (ref >60)
Glucose, Bld: 171 mg/dL — ABNORMAL HIGH (ref 70–99)
Potassium: 3.4 mmol/L — ABNORMAL LOW (ref 3.5–5.1)
Sodium: 139 mmol/L (ref 135–145)
Total Bilirubin: 0.7 mg/dL (ref 0.0–1.2)
Total Protein: 7.2 g/dL (ref 6.5–8.1)

## 2024-07-10 LAB — CBC
HCT: 43.3 % (ref 36.0–46.0)
Hemoglobin: 13.9 g/dL (ref 12.0–15.0)
MCH: 28.5 pg (ref 26.0–34.0)
MCHC: 32.1 g/dL (ref 30.0–36.0)
MCV: 88.7 fL (ref 80.0–100.0)
Platelets: 196 K/uL (ref 150–400)
RBC: 4.88 MIL/uL (ref 3.87–5.11)
RDW: 16.3 % — ABNORMAL HIGH (ref 11.5–15.5)
WBC: 9.8 K/uL (ref 4.0–10.5)
nRBC: 0 % (ref 0.0–0.2)

## 2024-07-10 LAB — TROPONIN T, HIGH SENSITIVITY: Troponin T High Sensitivity: <15 ng/L (ref 0–19)

## 2024-07-10 LAB — URINALYSIS, ROUTINE W REFLEX MICROSCOPIC
Bacteria, UA: NONE SEEN
Bilirubin Urine: NEGATIVE
Glucose, UA: 150 mg/dL — AB
Hgb urine dipstick: NEGATIVE
Ketones, ur: 80 mg/dL — AB
Leukocytes,Ua: NEGATIVE
Nitrite: NEGATIVE
Protein, ur: 100 mg/dL — AB
Specific Gravity, Urine: 1.028 (ref 1.005–1.030)
Squamous Epithelial / HPF: 0 /HPF (ref 0–5)
pH: 5 (ref 5.0–8.0)

## 2024-07-10 LAB — RESP PANEL BY RT-PCR (RSV, FLU A&B, COVID)  RVPGX2
Influenza A by PCR: NEGATIVE
Influenza B by PCR: NEGATIVE
Resp Syncytial Virus by PCR: NEGATIVE
SARS Coronavirus 2 by RT PCR: NEGATIVE

## 2024-07-10 LAB — LIPASE, BLOOD: Lipase: 12 U/L (ref 11–51)

## 2024-07-10 MED ORDER — ACETAMINOPHEN 325 MG PO TABS
650.0000 mg | ORAL_TABLET | Freq: Once | ORAL | Status: AC
  Administered 2024-07-10: 650 mg via ORAL
  Filled 2024-07-10: qty 2

## 2024-07-10 MED ORDER — ONDANSETRON 4 MG PO TBDP: 4.0000 mg | ORAL_TABLET | Freq: Three times a day (TID) | ORAL | 0 refills | Status: AC | PRN

## 2024-07-10 MED ORDER — ONDANSETRON HCL 4 MG/2ML IJ SOLN
4.0000 mg | Freq: Once | INTRAMUSCULAR | Status: AC
  Administered 2024-07-10: 4 mg via INTRAVENOUS
  Filled 2024-07-10: qty 2

## 2024-07-10 MED ORDER — SODIUM CHLORIDE 0.9 % IV BOLUS
1000.0000 mL | Freq: Once | INTRAVENOUS | Status: AC
  Administered 2024-07-10: 1000 mL via INTRAVENOUS

## 2024-07-10 NOTE — ED Triage Notes (Addendum)
 Pt comes with two days of cough, congestion, sweats. Pt states nausea and dry heaves. Pt denies any cp, dizziness, sob. Pt states pain in belly from the dry heaving.

## 2024-07-10 NOTE — ED Provider Notes (Signed)
 [facility] Provider Note    Event Date/Time   First MD Initiated Contact with Patient 07/10/24 [phone]     (approximate)   History   Cough   HPI  [name] is a 58 y.o. female who presents today for evaluation of cough and dry heaving for the past 2 days.  Patient reports her symptoms began significant nasal congestion and she began to have nausea and vomiting.  She reports that she has loose stools.  No chest pain.  She reports that she has belly pain only from throwing up, though no pain when she is not throwing up.  No fevers, though reports that she feels cold.  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   Hematochezia 08/17/2021   Influenza A 08/16/2021   Nausea vomiting and diarrhea 08/16/2021   Depression with anxiety 08/16/2021   HTN (hypertension) 08/16/2021   GERD (gastroesophageal reflux disease) 08/16/2021   Elevated troponin 08/16/2021   Sepsis (HCC) 08/16/2021   Hyperglycemia 08/16/2021   Major depressive disorder, recurrent, severe with psychotic features (HCC)    Acute gastritis 05/16/2015   Hypertensive urgency 05/13/2015   Facial droop 05/13/2015   Tobacco use disorder 05/10/2015   Cannabis use disorder, moderate, dependence (HCC) 05/10/2015   Major depressive disorder, recurrent severe without psychotic features (HCC) 05/09/2015   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          Physical Exam   Triage Vital Signs: ED Triage Vitals [07/10/24 0733]  Encounter Vitals Group     BP (!) 177/116     Girls Systolic BP Percentile      Girls Diastolic BP Percentile      Boys Systolic BP Percentile      Boys Diastolic BP Percentile      Pulse Rate 94     Resp 18     Temp 97.7 F (36.5 C)     Temp src      SpO2 100 %     Weight 150 lb (68 kg)     Height 5' 6 (1.676 m)     Head Circumference      Peak Flow      Pain Score 9     Pain Loc      Pain Education      Exclude from Growth  Chart     Most recent vital signs: Vitals:   07/10/24 0733 07/10/24 1212  BP: (!) 177/116   Pulse: 94   Resp: 18   Temp: 97.7 F (36.5 C) 98.1 F (36.7 C)  SpO2: 100%     Physical Exam Vitals and nursing note reviewed.  Constitutional:      General: Awake and alert. No acute distress.    Appearance: Normal appearance. The patient is normal weight.  HENT:     Head: Normocephalic and atraumatic.     Mouth: Mucous membranes are moist.  Nasal congestion present Eyes:     General: PERRL. Normal EOMs        Right eye: No discharge.        Left eye: No discharge.     Conjunctiva/sclera: Conjunctivae normal.  Cardiovascular:     Rate and Rhythm: Normal rate and regular rhythm.     Pulses: Normal pulses.  Pulmonary:     Effort: Pulmonary effort is normal. No respiratory distress.  No tachypnea or accessory muscle use.  Able to speak easily in complete sentences    Breath  sounds: Normal breath sounds.  Abdominal:     Abdomen is soft. There is no abdominal tenderness. No rebound or guarding. No distention. Musculoskeletal:        General: No swelling. Normal range of motion.     Cervical back: Normal range of motion and neck supple.  Skin:    General: Skin is warm and dry.     Capillary Refill: Capillary refill takes less than 2 seconds.     Findings: No rash.  Neurological:     Mental Status: The patient is awake and alert.      ED Results / Procedures / Treatments   Labs (all labs ordered are listed, but only abnormal results are displayed) Labs Reviewed  CBC - Abnormal; Notable for the following components:      Result Value   RDW 16.3 (*)    All other components within normal limits  URINALYSIS, ROUTINE W REFLEX MICROSCOPIC - Abnormal; Notable for the following components:   Color, Urine YELLOW (*)    APPearance HAZY (*)    Glucose, UA 150 (*)    Ketones, ur 80 (*)    Protein, ur 100 (*)    All other components within normal limits  COMPREHENSIVE METABOLIC  PANEL WITH GFR - Abnormal; Notable for the following components:   Potassium 3.4 (*)    Glucose, Bld 171 (*)    BUN 26 (*)    Albumin 5.1 (*)    AST 14 (*)    All other components within normal limits  RESP PANEL BY RT-PCR (RSV, FLU A&B, COVID)  RVPGX2  LIPASE, BLOOD  TROPONIN T, HIGH SENSITIVITY  TROPONIN T, HIGH SENSITIVITY     EKG     RADIOLOGY I independently reviewed and interpreted imaging and agree with radiologists findings.     PROCEDURES:  Critical Care performed:   Procedures   MEDICATIONS ORDERED IN ED: Medications  sodium chloride  0.9 % bolus 1,000 mL (0 mLs Intravenous Stopped 07/10/24 1212)  ondansetron  (ZOFRAN ) injection 4 mg (4 mg Intravenous Given 07/10/24 1003)  acetaminophen  (TYLENOL ) tablet 650 mg (650 mg Oral Given 07/10/24 1142)     IMPRESSION / MDM / ASSESSMENT AND PLAN / ED COURSE  I reviewed the triage vital signs and the nursing notes.   Differential diagnosis includes, but is not limited to, gastroenteritis, COVID-19, influenza, dehydration, electrolyte disarray, pneumonia, bronchitis.  I reviewed the patient's chart.  Patient was seen in the emergency department in January of this year with nausea and vomiting and was found to be tachycardic to 160.  She was ultimately discharged after being diagnosed with dehydration.  Patient is awake and alert, hemodynamically stable and afebrile.  She has nasal congestion on exam, and a dry cough, though her lungs are clear to auscultation bilaterally and she demonstrates no increased work of breathing.  Further workup is indicated.  Labs obtained and are overall reassuring.  She did appear to be mildly dehydrated with a BUN to creatinine ratio suggestive of prerenal volume depletion, and she was hydrated with 1 L of normal saline.  She was also treated symptomatically with good effect.  Chest x-ray does not reveal evidence of pneumonia.  Symptoms of cough and nasal congestion are most consistent with  URI.  I do not feel that she requires antibiotics at this time given symptoms most consistent with viral etiology at this time.  However, we did discuss return precautions and symptoms that would be suggestive of possible bacterial infection that would warrant further  workup and management.  Upon reevaluation, patient reports that she feels significantly improved and ready for discharge home.  I recommended close outpatient follow-up and strict return precautions.  Patient understands and agrees with plan.  Discharged in stable condition.   Patient's presentation is most consistent with acute complicated illness / injury requiring diagnostic workup.    FINAL CLINICAL IMPRESSION(S) / ED DIAGNOSES   Final diagnoses:  Viral URI with cough  Nausea and vomiting, unspecified vomiting type     Rx / DC Orders   ED Discharge Orders          Ordered    ondansetron  (ZOFRAN -ODT) 4 MG disintegrating tablet  Every 8 hours PRN        07/10/24 1131             Note:  This document was prepared using Dragon voice recognition software and may include unintentional dictation errors.   Nashla Althoff E, PA-C 07/11/24 9091    Bradler, Evan K, MD 07/16/24 [phone]

## 2024-07-10 NOTE — Discharge Instructions (Addendum)
 Your blood work, chest x-ray, and COVID/flu/RSV test are all reassuring.  You did appear to be dehydrated and you were treated with IV fluids.  You may take the nausea medicine as prescribed to help with your symptoms, as well as Tylenol /ibuprofen  per package instructions to help with your symptoms.  Please return for any new, worsening, or changing symptoms or other concerns.  It was a pleasure caring for you today.
